# Patient Record
Sex: Female | Born: 1990 | Race: Black or African American | Hispanic: No | Marital: Single | Smoking: Never smoker
Health system: Southern US, Community
[De-identification: ages and names within clinical notes are randomized; demographics above are authoritative.]

## PROBLEM LIST (undated history)

## (undated) ENCOUNTER — Inpatient Hospital Stay (HOSPITAL_COMMUNITY): Payer: Self-pay

## (undated) ENCOUNTER — Ambulatory Visit: Source: Home / Self Care

## (undated) ENCOUNTER — Emergency Department (HOSPITAL_COMMUNITY): Admission: EM | Payer: Self-pay | Source: Home / Self Care

## (undated) DIAGNOSIS — F419 Anxiety disorder, unspecified: Secondary | ICD-10-CM

## (undated) DIAGNOSIS — F32A Depression, unspecified: Secondary | ICD-10-CM

## (undated) DIAGNOSIS — F329 Major depressive disorder, single episode, unspecified: Secondary | ICD-10-CM

## (undated) DIAGNOSIS — J45909 Unspecified asthma, uncomplicated: Secondary | ICD-10-CM

## (undated) DIAGNOSIS — I10 Essential (primary) hypertension: Secondary | ICD-10-CM

## (undated) DIAGNOSIS — O211 Hyperemesis gravidarum with metabolic disturbance: Secondary | ICD-10-CM

## (undated) DIAGNOSIS — O365990 Maternal care for other known or suspected poor fetal growth, unspecified trimester, not applicable or unspecified: Secondary | ICD-10-CM

## (undated) DIAGNOSIS — D649 Anemia, unspecified: Secondary | ICD-10-CM

## (undated) HISTORY — PX: DILATION AND CURETTAGE OF UTERUS: SHX78

## (undated) HISTORY — DX: Essential (primary) hypertension: I10

## (undated) HISTORY — DX: Anemia, unspecified: D64.9

---
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
## 2014-08-18 HISTORY — PX: LAPAROSCOPIC GASTRIC SLEEVE RESECTION: SHX5895

## 2017-11-02 ENCOUNTER — Other Ambulatory Visit: Payer: Self-pay

## 2017-11-02 ENCOUNTER — Encounter (HOSPITAL_COMMUNITY): Payer: Self-pay | Admitting: Emergency Medicine

## 2017-11-02 ENCOUNTER — Other Ambulatory Visit (HOSPITAL_COMMUNITY): Payer: Self-pay

## 2017-11-02 ENCOUNTER — Inpatient Hospital Stay (HOSPITAL_COMMUNITY)
Admission: EM | Admit: 2017-11-02 | Discharge: 2017-11-03 | Disposition: A | Discharge status: Other Acute Inpatient Hospital | Payer: Self-pay | Attending: Obstetrics & Gynecology | Admitting: Obstetrics & Gynecology

## 2017-11-02 ENCOUNTER — Emergency Department (HOSPITAL_COMMUNITY): Payer: Self-pay

## 2017-11-02 DIAGNOSIS — O10919 Unspecified pre-existing hypertension complicating pregnancy, unspecified trimester: Secondary | ICD-10-CM | POA: Diagnosis present

## 2017-11-02 DIAGNOSIS — R102 Pelvic and perineal pain: Secondary | ICD-10-CM

## 2017-11-02 DIAGNOSIS — O210 Mild hyperemesis gravidarum: Secondary | ICD-10-CM | POA: Insufficient documentation

## 2017-11-02 DIAGNOSIS — Z3A01 Less than 8 weeks gestation of pregnancy: Secondary | ICD-10-CM | POA: Insufficient documentation

## 2017-11-02 DIAGNOSIS — R1084 Generalized abdominal pain: Secondary | ICD-10-CM | POA: Insufficient documentation

## 2017-11-02 DIAGNOSIS — O26899 Other specified pregnancy related conditions, unspecified trimester: Secondary | ICD-10-CM

## 2017-11-02 DIAGNOSIS — O219 Vomiting of pregnancy, unspecified: Secondary | ICD-10-CM | POA: Diagnosis present

## 2017-11-02 LAB — CBC WITH DIFFERENTIAL/PLATELET
BASOS ABS: 0 10*3/uL (ref 0.0–0.1)
Basophils Relative: 0 %
EOS ABS: 0 10*3/uL (ref 0.0–0.7)
EOS PCT: 0 %
HCT: 35.2 % — ABNORMAL LOW (ref 36.0–46.0)
Hemoglobin: 12.1 g/dL (ref 12.0–15.0)
LYMPHS ABS: 1.5 10*3/uL (ref 0.7–4.0)
Lymphocytes Relative: 13 %
MCH: 30.9 pg (ref 26.0–34.0)
MCHC: 34.4 g/dL (ref 30.0–36.0)
MCV: 89.8 fL (ref 78.0–100.0)
Monocytes Absolute: 0.5 10*3/uL (ref 0.1–1.0)
Monocytes Relative: 5 %
Neutro Abs: 9.2 10*3/uL — ABNORMAL HIGH (ref 1.7–7.7)
Neutrophils Relative %: 82 %
PLATELETS: 253 10*3/uL (ref 150–400)
RBC: 3.92 MIL/uL (ref 3.87–5.11)
RDW: 12.8 % (ref 11.5–15.5)
WBC: 11.2 10*3/uL — ABNORMAL HIGH (ref 4.0–10.5)

## 2017-11-02 LAB — COMPREHENSIVE METABOLIC PANEL
ALT: 18 U/L (ref 14–54)
AST: 21 U/L (ref 15–41)
Albumin: 3.9 g/dL (ref 3.5–5.0)
Alkaline Phosphatase: 47 U/L (ref 38–126)
Anion gap: 12 (ref 5–15)
BUN: 9 mg/dL (ref 6–20)
CHLORIDE: 106 mmol/L (ref 101–111)
CO2: 19 mmol/L — AB (ref 22–32)
CREATININE: 0.61 mg/dL (ref 0.44–1.00)
Calcium: 9.1 mg/dL (ref 8.9–10.3)
GFR calc Af Amer: >60 mL/min (ref >60)
GFR calc non Af Amer: >60 mL/min (ref >60)
Glucose, Bld: 134 mg/dL — ABNORMAL HIGH (ref 65–99)
POTASSIUM: 3.5 mmol/L (ref 3.5–5.1)
SODIUM: 137 mmol/L (ref 135–145)
Total Bilirubin: 0.7 mg/dL (ref 0.3–1.2)
Total Protein: 7.9 g/dL (ref 6.5–8.1)

## 2017-11-02 LAB — URINALYSIS, ROUTINE W REFLEX MICROSCOPIC
BILIRUBIN URINE: NEGATIVE
Bacteria, UA: NONE SEEN
GLUCOSE, UA: NEGATIVE mg/dL
HGB URINE DIPSTICK: NEGATIVE
KETONES UR: 80 mg/dL — AB
LEUKOCYTES UA: NEGATIVE
NITRITE: NEGATIVE
Specific Gravity, Urine: 1.028 (ref 1.005–1.030)
pH: 7 (ref 5.0–8.0)

## 2017-11-02 LAB — LIPASE, BLOOD: Lipase: 33 U/L (ref 11–51)

## 2017-11-02 MED ORDER — ONDANSETRON HCL 4 MG/2ML IJ SOLN
4.0000 mg | Freq: Once | INTRAMUSCULAR | Status: AC
  Administered 2017-11-02: 4 mg via INTRAVENOUS
  Filled 2017-11-02: qty 2

## 2017-11-02 MED ORDER — DEXTROSE IN LACTATED RINGERS 5 % IV SOLN
Freq: Once | INTRAVENOUS | Status: AC
  Administered 2017-11-02: 1000 mL/h via INTRAVENOUS
  Filled 2017-11-02: qty 1000

## 2017-11-02 MED ORDER — ACETAMINOPHEN 650 MG RE SUPP
650.0000 mg | Freq: Once | RECTAL | Status: AC
  Administered 2017-11-02: 650 mg via RECTAL
  Filled 2017-11-02: qty 1

## 2017-11-02 MED ORDER — PROMETHAZINE HCL 25 MG/ML IJ SOLN
12.5000 mg | Freq: Once | INTRAMUSCULAR | Status: AC
  Administered 2017-11-02: 12.5 mg via INTRAVENOUS
  Filled 2017-11-02: qty 1

## 2017-11-02 MED ORDER — PYRIDOXINE HCL 100 MG/ML IJ SOLN
100.0000 mg | Freq: Once | INTRAMUSCULAR | Status: AC
  Administered 2017-11-02: 100 mg via INTRAVENOUS
  Filled 2017-11-02: qty 1

## 2017-11-02 MED ORDER — FENTANYL CITRATE (PF) 100 MCG/2ML IJ SOLN
50.0000 ug | Freq: Once | INTRAMUSCULAR | Status: AC
  Administered 2017-11-02: 50 ug via INTRAVENOUS
  Filled 2017-11-02: qty 2

## 2017-11-02 MED ORDER — DEXAMETHASONE SODIUM PHOSPHATE 10 MG/ML IJ SOLN
10.0000 mg | Freq: Once | INTRAMUSCULAR | Status: AC
  Administered 2017-11-02: 10 mg via INTRAVENOUS
  Filled 2017-11-02: qty 1

## 2017-11-02 MED ORDER — DEXTROSE IN LACTATED RINGERS 5 % IV SOLN
Freq: Once | INTRAVENOUS | Status: AC
  Administered 2017-11-02: 13:00:00 via INTRAVENOUS
  Filled 2017-11-02: qty 1000

## 2017-11-02 MED ORDER — LACTATED RINGERS IV BOLUS: 1000.0000 mL | Freq: Once | INTRAVENOUS | Status: DC

## 2017-11-02 MED ORDER — PROMETHAZINE HCL 25 MG/ML IJ SOLN
25.0000 mg | Freq: Once | INTRAMUSCULAR | Status: DC
  Filled 2017-11-02: qty 1

## 2017-11-02 MED ORDER — DEXTROSE IN LACTATED RINGERS 5 % IV SOLN
Freq: Once | INTRAVENOUS | Status: AC
  Administered 2017-11-02: 18:00:00 via INTRAVENOUS
  Filled 2017-11-02: qty 1000

## 2017-11-02 MED ORDER — FAMOTIDINE IN NACL 20-0.9 MG/50ML-% IV SOLN
20.0000 mg | Freq: Once | INTRAVENOUS | Status: AC
  Administered 2017-11-02: 20 mg via INTRAVENOUS
  Filled 2017-11-02: qty 50

## 2017-11-02 NOTE — ED Triage Notes (Signed)
Patient c/o not feeling well x 2 weeks. Emesis and shortness of breathe. RN was called into lobby bathroom stating a patient had collapsed. Pt was laying beside the toilet trying to vomit. Pt crying stating she cannot breathe. 02 sat 100% on RA, placed on 2L  for comfort. Pt crying in triage c/o lower abdominal pain x 2 days. Pt saw clinic yesterday and was told she is [redacted] weeks pregnant. US was reassuring.

## 2017-11-02 NOTE — MAU Note (Signed)
Upon taking IV pain medication and fluids to pain room, pt was sitting in a wheelchair with significant other about to wheel the patient out with IV still in place. Pt states that she is frustrated and wants to come back tomorrow because she "doesn't understand why everybody has to keep asking the same questions and why she is not being helped because she feels worse [facility]" RN explained to patient that questions have to be asked to verify the information is correct. RN stated to patient that CNM had ordered more IVF's and a different medication to help with nausea. Pt informed of rights to refuse if she did not wish to have the medication and IVF's and that she would have to sign an AMA form. After discussion with significant other, patient agreed to have the medication and IVF's. Pt appears to be more calm at this time.

## 2017-11-02 NOTE — MAU Note (Signed)
[facility] that started 2 days ago. Pt states that the pain "hurts all over" and feels like she is having contractions. Pt states the pain comes and goes. States she was given "something at the other hospital for pain." Pt denies vaginal bleeding or discharge. LMP 09/14/2017.

## 2017-11-02 NOTE — ED Notes (Signed)
[facility] arrived  

## 2017-11-02 NOTE — ED Provider Notes (Signed)
[facility]-EMERGENCY DEPT Provider Note   CSN: [id number] Arrival date & time: 11/02/17  1146     History   Chief Complaint Chief Complaint  Patient presents with  . Abdominal Pain  . Shortness of Breath    HPI Tammy Andrews is a 27 y.o. female who presents the emergency department with vomiting.  Patient is approximately [redacted] weeks pregnant.  She has been pregnant once before but unfortunately had a miscarriage.  She had multiple episodes of hyperemesis gravidarum during her previous pregnancy.  The patient has been persistently nauseous.  She has had URI symptoms over the past 2 weeks without fevers or chills.  Yesterday she had a few episodes of vomiting but this morning woke up with severe intractable nausea and vomiting.  She has a little bit of abdominal cramping but denies any blood or fluid leaking from her vagina.  She denies diarrhea, fevers or chills.  Patient does complain of chest congestion.  She did not take anything for her vomiting this morning.  She does not have episodes of severe vomiting outside of her pregnancies.  HPI  History reviewed. No pertinent past medical history.  There are no active problems to display for this patient.   The histories are not reviewed yet. Please review them in the "History" navigator section and refresh this SmartLink.   OB History    Gravida  1   Para      Term      Preterm      AB      Living        SAB      TAB      Ectopic      Multiple      Live Births               Home Medications    Prior to Admission medications   Not on File    Family History No family history on file.  Social History Social History   Tobacco Use  . Smoking status: Never Smoker  . Smokeless tobacco: Never Used  Substance Use Topics  . Alcohol use: Not on file  . Drug use: Not on file     Allergies   Patient has no known allergies.   Review of Systems Review of Systems  Ten systems  reviewed and are negative for acute change, except as noted in the HPI.  Physical Exam Updated Vital Signs BP (!) 149/106 (BP Location: Left Arm)   Pulse 91   Temp 98.4 F (36.9 C) (Oral)   Resp (!) 21   Ht 5\' 7"  (1.702 m)   Wt 104.8 kg (231 lb)   SpO2 100%   BMI 36.18 kg/m   Physical Exam  Constitutional: She is oriented to person, place, and time. She appears well-developed and well-nourished. No distress.  Patient leaning over the side of the bed with an emesis bag.  She is retching constantly and barely able to get words out.  She has retching and vomiting during HPI and physical exam.  HENT:  Head: Normocephalic and atraumatic.  Eyes: Conjunctivae are normal. No scleral icterus.  Neck: Normal range of motion.  Cardiovascular: Normal rate, regular rhythm and normal heart sounds. Exam reveals no gallop and no friction rub.  No murmur heard. Pulmonary/Chest: Effort normal and breath sounds normal. No respiratory distress.  Abdominal: Soft. Bowel sounds are normal. She exhibits no distension and no mass. There is no tenderness. There is no guarding.  Neurological: She is alert and oriented to person, place, and time.  Skin: Skin is warm and dry. She is not diaphoretic.  Psychiatric: Her behavior is normal.  Nursing note and vitals reviewed.    ED Treatments / Results  Labs (all labs ordered are listed, but only abnormal results are displayed) Labs Reviewed - No data to display  EKG None  Radiology No results found.  Procedures Procedures (including critical care time)  Medications Ordered in ED Medications - No data to display   Initial Impression / Assessment and Plan / ED Course  I have reviewed the triage vital signs and the nursing notes.  Pertinent labs & imaging results that were available during my care of the patient were reviewed by me and considered in my medical decision making (see chart for details).  Clinical Course as of Nov 03 1618  Thu Nov 02, 2017  1422 Anion gap: 12 [AH]  1422 Glucose(!): 134 [AH]  1422 CO2(!): 19 [AH]  1422 Ketones, ur(!): 80 [AH]  1518 Patient still extremely nauseated. She is vomiting less frequently but has intermittent gagging and retching    [AH]    Clinical Course User Index [AH] Arthor CaptainHarris, Abdulahad Mederos, PA-C   She is complaining of diffuse abdominal pain.  On reexamination she has a soft, diffusely tender abdomen without any focal tenderness, no epigastric tenderness.  I suspect abdominal wall tenderness from persistent vomiting.  Patient is clearly upset.  Her mother is at bedside and states "she cannot go home like this."  I reassured the patient and her mother that her lab work is reassuring and that this appears to be likely hyperemesis gravidarum.  She is being treated appropriately and we will send her home only if it is safe for her to go home and she can tolerate fluids.  I discussed the fact that she may not be comfortable at discharge and that she may have persistent vomiting given the fact that she is in her early pregnancy but that is not necessarily a reason to be hospitalized.  I also discussed the fact that if she is unable to tolerate holding down fluids we would not send her home.  She is continuing to get IV fluids.  I discussed the case with PA Shrosbree who will assume care of the patient for appropriate discharge.  Final Clinical Impressions(s) / ED Diagnoses   Final diagnoses:  None    ED Discharge Orders    None       Arthor CaptainHarris, Jazier Mcglamery, PA-C 11/02/17 1624    Gerhard MunchLockwood, Robert, MD 11/03/17 [phone]

## 2017-11-02 NOTE — ED Notes (Signed)
[facility] leaving with patient. Husband given address of [facility] in case he is unable to follow behind the ambulance.

## 2017-11-02 NOTE — ED Notes (Signed)
Pt is alert and oriented x 4 and is verbally responsive. Pt is accompanied with aunt pt is observed vomiting and dry heaving. Pt reports that she has generalized abdominal pain pt has n/v denies diarrhea. Pt denies pain and burning with urination. Pt report last BM 4/18 and denies evident blood in stool.

## 2017-11-02 NOTE — ED Notes (Signed)
Paperwork at nurses station 

## 2017-11-02 NOTE — ED Notes (Signed)
Report to Charge RN on OB special care floor-[facility] notified of need for transport-[facility]

## 2017-11-02 NOTE — MAU Provider Note (Signed)
Chief Complaint: Abdominal Pain and Shortness of Breath   First Provider Initiated Contact with Patient 11/02/17 2133        SUBJECTIVE HPI: Tammy Andrews is a 27 y.o. G2P0100 at [redacted]w[redacted]d by LMP who presents to maternity admissions reporting severe nausea and vomiting.  Was sent here for further management. Inadvertently taken to 3rd floor then back here.  Upset over confusion. .Family wants her to be admitted.  She denies vaginal bleeding, vaginal itching/burning, urinary symptoms, h/a, dizziness, or fever/chills.     Abdominal Pain  This is a new problem. The current episode started today. The onset quality is gradual. The problem occurs intermittently. The problem has been unchanged. The pain is located in the generalized abdominal region. The quality of the pain is colicky and cramping. The abdominal pain does not radiate. Associated symptoms include nausea. Pertinent negatives include no constipation, dysuria, fever or myalgias. Nothing aggravates the pain. The pain is relieved by nothing.   RN Note: Pt transferred from [facility] with stomach pain that started 2 days ago. Pt states that the pain "hurts all over" and feels like she is having contractions. Pt states the pain comes and goes. States she was given "something at the other hospital for pain." Pt denies vaginal bleeding or discharge. LMP 09/14/2017.  ED MD note: On recheck she cannot tolerate PO fluids, actively vomiting. States she has generalized abdominal pain with vomiting. Abdominal exam soft and mildly tender in the suprapubic area, no guarding or rigidity. She denies vaginal bleeding or discharge. Reports she had a "normal" ultrasound yesterday which showed IUP. Suspect her tenderness is related to her ongoing vomiting. Discussed this patient with OB/GYN Dr. Macon Large who recommends more LR and Zofran.    Past Medical History:  Diagnosis Date  . Diabetes mellitus without complication (HCC)    not now   Past Surgical History:   Procedure Laterality Date  . LAPAROSCOPIC GASTRIC BAND REMOVAL WITH LAPAROSCOPIC GASTRIC SLEEVE RESECTION  08/2014   Social History   Socioeconomic History  . Marital status: Single    Spouse name: Not on file  . Number of children: Not on file  . Years of education: Not on file  . Highest education level: Not on file  Occupational History  . Not on file  Social Needs  . Financial resource strain: Not on file  . Food insecurity:    Worry: Not on file    Inability: Not on file  . Transportation needs:    Medical: Not on file    Non-medical: Not on file  Tobacco Use  . Smoking status: Never Smoker  . Smokeless tobacco: Never Used  Substance and Sexual Activity  . Alcohol use: Yes    Comment: socially  . Drug use: Not Currently    Types: Marijuana  . Sexual activity: Yes    Birth control/protection: None  Lifestyle  . Physical activity:    Days per week: Not on file    Minutes per session: Not on file  . Stress: Not on file  Relationships  . Social connections:    Talks on phone: Not on file    Gets together: Not on file    Attends religious service: Not on file    Active member of club or organization: Not on file    Attends meetings of clubs or organizations: Not on file    Relationship status: Not on file  . Intimate partner violence:    Fear of current or ex partner: Not on  file    Emotionally abused: Not on file    Physically abused: Not on file    Forced sexual activity: Not on file  Other Topics Concern  . Not on file  Social History Narrative  . Not on file   No current facility-administered medications on file prior to encounter.    No current outpatient medications on file prior to encounter.   No Known Allergies  I have reviewed patient's Past Medical Hx, Surgical Hx, Family Hx, Social Hx, medications and allergies.   ROS:  Review of Systems  Constitutional: Positive for fatigue. Negative for chills and fever.  Gastrointestinal: Positive for  nausea. Negative for constipation.  Genitourinary: Negative for dysuria, pelvic pain and vaginal bleeding.  Musculoskeletal: Negative for myalgias.  Neurological: Negative for dizziness.   Review of Systems  Other systems negative   Physical Exam  Physical Exam Patient Vitals for the past 24 hrs:  BP Temp Temp src Pulse Resp SpO2 Height Weight  11/02/17 2058 (!) 154/92 98.8 F (37.1 C) Oral 90 20 100 % - -  11/02/17 2057 - - - - - - 5\' 7"  (1.702 m) 256 lb (116.1 kg)  11/02/17 2000 (!) 154/94 - - 93 15 99 % - -  11/02/17 1952 (!) 158/106 - - 94 15 98 % - -  11/02/17 1930 (!) 164/108 - - 95 - 98 % - -  11/02/17 1603 (!) 117/102 98.7 F (37.1 C) Oral 81 (!) 22 100 % - -  11/02/17 1530 (!) 117/102 - - - - 100 % - -  11/02/17 1517 125/83 - - - - 100 % - -  11/02/17 1516 - 99 F (37.2 C) Oral - - - - -  11/02/17 1505 - - - - - 100 % - -  11/02/17 1400 (!) 152/93 - - - 16 100 % - -  11/02/17 1229 (!) 149/106 98.4 F (36.9 C) Oral 91 16 100 % 5\' 7"  (1.702 m) 231 lb (104.8 kg)  11/02/17 1208 127/78 - - 88 (!) 24 100 % - -   Constitutional: Well-developed, well-nourished female in no acute distress. Does appear tired, weak Cardiovascular: normal rate and regular rhythm Respiratory: normal effort GI: Abd soft, non-tender. Pos BS x 4 MS: Extremities nontender, no edema, normal ROM Neurologic: Alert and oriented x 4.  GU: Neg CVAT.  PELVIC EXAM: Deferred  LAB RESULTS Results for orders placed or performed during the hospital encounter of 11/02/17 (from the past 24 hour(s))  CBC with Differential     Status: Abnormal   Collection Time: 11/02/17 12:58 PM  Result Value Ref Range   WBC 11.2 (H) 4.0 - 10.5 K/uL   RBC 3.92 3.87 - 5.11 MIL/uL   Hemoglobin 12.1 12.0 - 15.0 g/dL   HCT 40.935.2 (L) 81.136.0 - 91.446.0 %   MCV 89.8 78.0 - 100.0 fL   MCH 30.9 26.0 - 34.0 pg   MCHC 34.4 30.0 - 36.0 g/dL   RDW 78.212.8 95.611.5 - 21.315.5 %   Platelets 253 150 - 400 K/uL   Neutrophils Relative % 82 %   Neutro  Abs 9.2 (H) 1.7 - 7.7 K/uL   Lymphocytes Relative 13 %   Lymphs Abs 1.5 0.7 - 4.0 K/uL   Monocytes Relative 5 %   Monocytes Absolute 0.5 0.1 - 1.0 K/uL   Eosinophils Relative 0 %   Eosinophils Absolute 0.0 0.0 - 0.7 K/uL   Basophils Relative 0 %   Basophils Absolute 0.0 0.0 -  0.1 K/uL  Comprehensive metabolic panel     Status: Abnormal   Collection Time: 11/02/17 12:58 PM  Result Value Ref Range   Sodium 137 135 - 145 mmol/L   Potassium 3.5 3.5 - 5.1 mmol/L   Chloride 106 101 - 111 mmol/L   CO2 19 (L) 22 - 32 mmol/L   Glucose, Bld 134 (H) 65 - 99 mg/dL   BUN 9 6 - 20 mg/dL   Creatinine, Ser 1.61 0.44 - 1.00 mg/dL   Calcium 9.1 8.9 - 09.6 mg/dL   Total Protein 7.9 6.5 - 8.1 g/dL   Albumin 3.9 3.5 - 5.0 g/dL   AST 21 15 - 41 U/L   ALT 18 14 - 54 U/L   Alkaline Phosphatase 47 38 - 126 U/L   Total Bilirubin 0.7 0.3 - 1.2 mg/dL   GFR calc non Af Amer >60 >60 mL/min   GFR calc Af Amer >60 >60 mL/min   Anion gap 12 5 - 15  Lipase, blood     Status: None   Collection Time: 11/02/17 12:58 PM  Result Value Ref Range   Lipase 33 11 - 51 U/L  Urinalysis, Routine w reflex microscopic     Status: Abnormal   Collection Time: 11/02/17  1:33 PM  Result Value Ref Range   Color, Urine AMBER (A) YELLOW   APPearance HAZY (A) CLEAR   Specific Gravity, Urine 1.028 1.005 - 1.030   pH 7.0 5.0 - 8.0   Glucose, UA NEGATIVE NEGATIVE mg/dL   Hgb urine dipstick NEGATIVE NEGATIVE   Bilirubin Urine NEGATIVE NEGATIVE   Ketones, ur 80 (A) NEGATIVE mg/dL   Protein, ur >=045 (A) NEGATIVE mg/dL   Nitrite NEGATIVE NEGATIVE   Leukocytes, UA NEGATIVE NEGATIVE   RBC / HPF 6-30 0 - 5 RBC/hpf   WBC, UA 0-5 0 - 5 WBC/hpf   Bacteria, UA NONE SEEN NONE SEEN   Squamous Epithelial / LPF 0-5 (A) NONE SEEN   Mucus PRESENT        IMAGING Dg Chest 2 View  Result Date: 11/02/2017 CLINICAL DATA:  Nausea and vomiting and shortness of breath EXAM: CHEST - 2 VIEW COMPARISON:  None. FINDINGS: The heart size and  mediastinal contours are within normal limits. Both lungs are clear. The visualized skeletal structures are unremarkable. IMPRESSION: No active cardiopulmonary disease. Electronically Signed   By: Alcide Clever M.D.   On: 11/02/2017 13:23    MAU Management/MDM: Has some diffuse abdominal pain, requested med. Fentanyl given with good relief IV hydration continued Pepcid added which produced considerable relief Decadron added (one dose) for hyperemesis After these measures, she was able to keep down a whole cup of ice chips   ASSESSMENT 1. Pelvic pain affecting pregnancy   2. Pelvic pain affecting pregnancy   3.     Hyperemesis vs Nausea/vomiting of pregnancy 4.    Chronic hypertension   PLAN Discharge home Rx labetalol 200mg  bid Rx Zofran ODT for nausea Rx Protonix for GERD Rx Promethazine to alternate with Zofran Advance diet as tolerated Plans care with Dr Hinton Rao  Pt stable at time of discharge. Encouraged to return here or to other Urgent Care/ED if she develops worsening of symptoms, increase in pain, fever, or other concerning symptoms.    Wynelle Bourgeois CNM, MSN Certified Nurse-Midwife 11/02/2017  9:34 PM

## 2017-11-02 NOTE — ED Provider Notes (Signed)
Patient with hyperemesis gravidarum.  Please read H&P PA Arthor CaptainAbigail Harris note for further details.  Lab work without any major lecture light abnormalities.  CBC with mild leukocytosis (WBC 11.2), suspect this is reactive given her persistent vomiting.  Lipase negative.  UA reveals small amount of ketones, likely related to her recent vomiting and poor PO intake.  She also has proteinuria, no hypertension.   Patient has received 25 mg Phenergan, 2 L D5 and LR and Vitamin B6.   On recheck she cannot tolerate PO fluids, actively vomiting. States she has generalized abdominal pain with vomiting. Abdominal exam soft and mildly tender in the suprapubic area, no guarding or rigidity. She denies vaginal bleeding or discharge. Reports she had a "normal" ultrasound yesterday which showed IUP. Suspect her tenderness is related to her ongoing vomiting. Discussed this patient with OB/GYN Dr. Macon LargeAnyanwu who recommends more LR and Zofran.   Patient received 4mg  IV Zofran and continues to receive LR.  On recheck, she vomits with p.o. challenge.  Discussed this patient again with Dr. Macon LargeAnyanwu who recommends transfer to MAU for further management. Patient and her husband at bedside informed.  Vitals:   11/02/17 1530 11/02/17 1603  BP: (!) 117/102 (!) 117/102  Pulse:  81  Resp:  (!) 22  Temp:  98.7 F (37.1 C)  SpO2: 100% 100%        Lawrence MarseillesShrosbree, Emily J, PA-C 11/02/17 1940    Gerhard MunchLockwood, Robert, MD 11/03/17 [phone]

## 2017-11-03 DIAGNOSIS — O210 Mild hyperemesis gravidarum: Secondary | ICD-10-CM

## 2017-11-03 DIAGNOSIS — O219 Vomiting of pregnancy, unspecified: Secondary | ICD-10-CM | POA: Diagnosis present

## 2017-11-03 DIAGNOSIS — O10919 Unspecified pre-existing hypertension complicating pregnancy, unspecified trimester: Secondary | ICD-10-CM | POA: Diagnosis present

## 2017-11-03 MED ORDER — ONDANSETRON 4 MG PO TBDP: 4.0000 mg | ORAL_TABLET | Freq: Four times a day (QID) | ORAL | 0 refills | Status: DC | PRN

## 2017-11-03 MED ORDER — PANTOPRAZOLE SODIUM 20 MG PO TBEC: 20.0000 mg | DELAYED_RELEASE_TABLET | Freq: Every day | ORAL | 2 refills | Status: DC

## 2017-11-03 MED ORDER — LABETALOL HCL 200 MG PO TABS: 200.0000 mg | ORAL_TABLET | Freq: Two times a day (BID) | ORAL | 3 refills | Status: DC

## 2017-11-03 MED ORDER — PROMETHAZINE HCL 25 MG PO TABS: 25.0000 mg | ORAL_TABLET | Freq: Four times a day (QID) | ORAL | 2 refills | Status: DC | PRN

## 2017-11-03 NOTE — MAU Note (Signed)
MAU provider notified of discharge vital signs.

## 2017-11-03 NOTE — Discharge Instructions (Signed)

## 2017-11-04 ENCOUNTER — Inpatient Hospital Stay (HOSPITAL_COMMUNITY)
Admission: AD | Admit: 2017-11-04 | Discharge: 2017-11-04 | Disposition: A | Discharge status: Home / Self Care | Payer: Self-pay | Source: Ambulatory Visit | Attending: Family Medicine | Admitting: Family Medicine

## 2017-11-04 ENCOUNTER — Inpatient Hospital Stay (HOSPITAL_COMMUNITY): Payer: Self-pay

## 2017-11-04 ENCOUNTER — Encounter (HOSPITAL_COMMUNITY): Payer: Self-pay

## 2017-11-04 DIAGNOSIS — E119 Type 2 diabetes mellitus without complications: Secondary | ICD-10-CM | POA: Insufficient documentation

## 2017-11-04 DIAGNOSIS — Z3A08 8 weeks gestation of pregnancy: Secondary | ICD-10-CM | POA: Insufficient documentation

## 2017-11-04 DIAGNOSIS — Z9889 Other specified postprocedural states: Secondary | ICD-10-CM | POA: Insufficient documentation

## 2017-11-04 DIAGNOSIS — O10919 Unspecified pre-existing hypertension complicating pregnancy, unspecified trimester: Secondary | ICD-10-CM

## 2017-11-04 DIAGNOSIS — Z833 Family history of diabetes mellitus: Secondary | ICD-10-CM | POA: Insufficient documentation

## 2017-11-04 DIAGNOSIS — O3481 Maternal care for other abnormalities of pelvic organs, first trimester: Secondary | ICD-10-CM | POA: Insufficient documentation

## 2017-11-04 DIAGNOSIS — R109 Unspecified abdominal pain: Secondary | ICD-10-CM

## 2017-11-04 DIAGNOSIS — R1013 Epigastric pain: Secondary | ICD-10-CM | POA: Insufficient documentation

## 2017-11-04 DIAGNOSIS — Z8249 Family history of ischemic heart disease and other diseases of the circulatory system: Secondary | ICD-10-CM | POA: Insufficient documentation

## 2017-11-04 DIAGNOSIS — O24311 Unspecified pre-existing diabetes mellitus in pregnancy, first trimester: Secondary | ICD-10-CM | POA: Insufficient documentation

## 2017-11-04 DIAGNOSIS — O219 Vomiting of pregnancy, unspecified: Secondary | ICD-10-CM | POA: Insufficient documentation

## 2017-11-04 DIAGNOSIS — O26899 Other specified pregnancy related conditions, unspecified trimester: Secondary | ICD-10-CM

## 2017-11-04 DIAGNOSIS — N83201 Unspecified ovarian cyst, right side: Secondary | ICD-10-CM | POA: Insufficient documentation

## 2017-11-04 DIAGNOSIS — O26891 Other specified pregnancy related conditions, first trimester: Secondary | ICD-10-CM | POA: Insufficient documentation

## 2017-11-04 LAB — CBC WITH DIFFERENTIAL/PLATELET
BASOS ABS: 0 10*3/uL (ref 0.0–0.1)
Basophils Relative: 0 %
EOS PCT: 0 %
Eosinophils Absolute: 0.1 10*3/uL (ref 0.0–0.7)
HCT: 32.3 % — ABNORMAL LOW (ref 36.0–46.0)
HEMOGLOBIN: 11.2 g/dL — AB (ref 12.0–15.0)
LYMPHS PCT: 23 %
Lymphs Abs: 3.2 10*3/uL (ref 0.7–4.0)
MCH: 31.2 pg (ref 26.0–34.0)
MCHC: 34.7 g/dL (ref 30.0–36.0)
MCV: 90 fL (ref 78.0–100.0)
Monocytes Absolute: 0.4 10*3/uL (ref 0.1–1.0)
Monocytes Relative: 3 %
NEUTROS PCT: 74 %
Neutro Abs: 10.2 10*3/uL — ABNORMAL HIGH (ref 1.7–7.7)
PLATELETS: 241 10*3/uL (ref 150–400)
RBC: 3.59 MIL/uL — AB (ref 3.87–5.11)
RDW: 13.3 % (ref 11.5–15.5)
WBC: 13.9 10*3/uL — AB (ref 4.0–10.5)

## 2017-11-04 LAB — COMPREHENSIVE METABOLIC PANEL
ALT: 17 U/L (ref 14–54)
ANION GAP: 9 (ref 5–15)
AST: 23 U/L (ref 15–41)
Albumin: 3.6 g/dL (ref 3.5–5.0)
Alkaline Phosphatase: 41 U/L (ref 38–126)
BILIRUBIN TOTAL: 0.4 mg/dL (ref 0.3–1.2)
BUN: 16 mg/dL (ref 6–20)
CHLORIDE: 108 mmol/L (ref 101–111)
CO2: 20 mmol/L — ABNORMAL LOW (ref 22–32)
Calcium: 8.5 mg/dL — ABNORMAL LOW (ref 8.9–10.3)
Creatinine, Ser: 0.61 mg/dL (ref 0.44–1.00)
Glucose, Bld: 111 mg/dL — ABNORMAL HIGH (ref 65–99)
POTASSIUM: 3.6 mmol/L (ref 3.5–5.1)
Sodium: 137 mmol/L (ref 135–145)
TOTAL PROTEIN: 7.2 g/dL (ref 6.5–8.1)

## 2017-11-04 LAB — URINALYSIS, ROUTINE W REFLEX MICROSCOPIC
BILIRUBIN URINE: NEGATIVE
Bacteria, UA: NONE SEEN
GLUCOSE, UA: NEGATIVE mg/dL
HGB URINE DIPSTICK: NEGATIVE
KETONES UR: NEGATIVE mg/dL
Nitrite: NEGATIVE
PH: 5 (ref 5.0–8.0)
PROTEIN: 100 mg/dL — AB
Specific Gravity, Urine: 1.033 — ABNORMAL HIGH (ref 1.005–1.030)

## 2017-11-04 LAB — HCG, QUANTITATIVE, PREGNANCY: HCG, BETA CHAIN, QUANT, S: 97915 m[IU]/mL — AB (ref <5)

## 2017-11-04 LAB — ABO/RH: ABO/RH(D): O POS

## 2017-11-04 MED ORDER — PROMETHAZINE HCL 25 MG PO TABS: 25.0000 mg | ORAL_TABLET | Freq: Four times a day (QID) | ORAL | 2 refills | Status: DC | PRN

## 2017-11-04 MED ORDER — FAMOTIDINE IN NACL 20-0.9 MG/50ML-% IV SOLN
20.0000 mg | Freq: Once | INTRAVENOUS | Status: AC
  Administered 2017-11-04: 20 mg via INTRAVENOUS
  Filled 2017-11-04: qty 50

## 2017-11-04 MED ORDER — LABETALOL HCL 200 MG PO TABS: 200.0000 mg | ORAL_TABLET | Freq: Two times a day (BID) | ORAL | 3 refills | Status: DC

## 2017-11-04 MED ORDER — FAMOTIDINE 20 MG PO TABS: 20.0000 mg | ORAL_TABLET | Freq: Two times a day (BID) | ORAL | 2 refills | Status: DC

## 2017-11-04 MED ORDER — ONDANSETRON HCL 4 MG/2ML IJ SOLN
4.0000 mg | Freq: Once | INTRAMUSCULAR | Status: AC
  Administered 2017-11-04: 4 mg via INTRAVENOUS
  Filled 2017-11-04: qty 2

## 2017-11-04 MED ORDER — PROMETHAZINE HCL 25 MG/ML IJ SOLN
12.5000 mg | Freq: Once | INTRAMUSCULAR | Status: AC
  Administered 2017-11-04: 12.5 mg via INTRAVENOUS
  Filled 2017-11-04: qty 1

## 2017-11-04 MED ORDER — PROMETHAZINE HCL 25 MG/ML IJ SOLN
25.0000 mg | Freq: Once | INTRAVENOUS | Status: AC
  Administered 2017-11-04: 25 mg via INTRAVENOUS
  Filled 2017-11-04: qty 1

## 2017-11-04 MED ORDER — NALBUPHINE HCL 10 MG/ML IJ SOLN
10.0000 mg | Freq: Once | INTRAMUSCULAR | Status: AC
  Administered 2017-11-04: 10 mg via INTRAVENOUS
  Filled 2017-11-04: qty 1

## 2017-11-04 NOTE — MAU Note (Signed)
Pt did not pick up medication because her insurance didn't cover the cost. Having abdominal pain also. 10/10

## 2017-11-04 NOTE — MAU Provider Note (Signed)
History     CSN: [id number]  Arrival date and time: 11/04/17 0800   First Provider Initiated Contact with Patient 11/04/17 0845      Chief Complaint  Patient presents with  . Emesis  . Abdominal Pain   HPI [name] is a 27 y.o. G2P0100 at [redacted]w[redacted]d who presents to MAU today with complaint of N/V and abdominal pain. Patient seen twice in the last 2 days for same symptoms significant abdominal pain. The majority of her pain is epigastric and LLQ. No vaginal bleeding or fever. She was given Rx for Zofran, Phenergan and Protonix and states she cannot afford her medications so she has been unable to take anything at home.   OB History    Gravida  2   Para  1   Term  0   Preterm  1   AB  0   Living  0     SAB  0   TAB  0   Ectopic  0   Multiple  0   Live Births  0           Past Medical History:  Diagnosis Date  . Diabetes mellitus without complication (HCC)    not now    Past Surgical History:  Procedure Laterality Date  . LAPAROSCOPIC GASTRIC BAND REMOVAL WITH LAPAROSCOPIC GASTRIC SLEEVE RESECTION  08/2014    Family History  Problem Relation Age of Onset  . Hypertension Father   . Diabetes Paternal Grandmother     Social History   Tobacco Use  . Smoking status: Never Smoker  . Smokeless tobacco: Never Used  Substance Use Topics  . Alcohol use: Yes    Comment: socially  . Drug use: Not Currently    Types: Marijuana    Allergies: No Known Allergies  No medications prior to admission.    Review of Systems  Constitutional: Negative for fever.  Gastrointestinal: Positive for abdominal pain, nausea and vomiting. Negative for constipation and diarrhea.  Genitourinary: Negative for vaginal bleeding and vaginal discharge.   Physical Exam   Blood pressure 138/79, pulse 76, temperature 98.5 F (36.9 C), temperature source Oral, resp. rate 18, weight 258 lb (117 kg), last menstrual period 09/04/2017, SpO2 100 %.  Physical Exam  Nursing  note and vitals reviewed. Constitutional: She is oriented to person, place, and time. She appears well-developed and well-nourished. No distress.  Patient is crying and obviously uncomfortable  HENT:  Head: Normocephalic and atraumatic.  Cardiovascular: Normal rate.  Respiratory: Effort normal.  GI: Soft. She exhibits no distension and no mass. There is tenderness (very mild epigastric and LLQ tenderness to palpation ). There is no rebound and no guarding.  Neurological: She is alert and oriented to person, place, and time.  Skin: Skin is warm and dry. No erythema.  Psychiatric: She has a normal mood and affect.    Results for orders placed or performed during the hospital encounter of 11/04/17 (from the past 24 hour(s))  Urinalysis, Routine w reflex microscopic     Status: Abnormal   Collection Time: 11/04/17  8:20 AM  Result Value Ref Range   Color, Urine YELLOW YELLOW   APPearance HAZY (A) CLEAR   Specific Gravity, Urine 1.033 (H) 1.005 - 1.030   pH 5.0 5.0 - 8.0   Glucose, UA NEGATIVE NEGATIVE mg/dL   Hgb urine dipstick NEGATIVE NEGATIVE   Bilirubin Urine NEGATIVE NEGATIVE   Ketones, ur NEGATIVE NEGATIVE mg/dL   Protein, ur 409 (A) NEGATIVE mg/dL  Nitrite NEGATIVE NEGATIVE   Leukocytes, UA TRACE (A) NEGATIVE   RBC / HPF 6-30 0 - 5 RBC/hpf   WBC, UA 0-5 0 - 5 WBC/hpf   Bacteria, UA NONE SEEN NONE SEEN   Squamous Epithelial / LPF 6-30 (A) NONE SEEN   Mucus PRESENT   Comprehensive metabolic panel     Status: Abnormal   Collection Time: 11/04/17  9:25 AM  Result Value Ref Range   Sodium 137 135 - 145 mmol/L   Potassium 3.6 3.5 - 5.1 mmol/L   Chloride 108 101 - 111 mmol/L   CO2 20 (L) 22 - 32 mmol/L   Glucose, Bld 111 (H) 65 - 99 mg/dL   BUN 16 6 - 20 mg/dL   Creatinine, Ser 1.61 0.44 - 1.00 mg/dL   Calcium 8.5 (L) 8.9 - 10.3 mg/dL   Total Protein 7.2 6.5 - 8.1 g/dL   Albumin 3.6 3.5 - 5.0 g/dL   AST 23 15 - 41 U/L   ALT 17 14 - 54 U/L   Alkaline Phosphatase 41 38 -  126 U/L   Total Bilirubin 0.4 0.3 - 1.2 mg/dL   GFR calc non Af Amer >60 >60 mL/min   GFR calc Af Amer >60 >60 mL/min   Anion gap 9 5 - 15  CBC with Differential/Platelet     Status: Abnormal   Collection Time: 11/04/17  9:25 AM  Result Value Ref Range   WBC 13.9 (H) 4.0 - 10.5 K/uL   RBC 3.59 (L) 3.87 - 5.11 MIL/uL   Hemoglobin 11.2 (L) 12.0 - 15.0 g/dL   HCT 09.6 (L) 04.5 - 40.9 %   MCV 90.0 78.0 - 100.0 fL   MCH 31.2 26.0 - 34.0 pg   MCHC 34.7 30.0 - 36.0 g/dL   RDW 81.1 91.4 - 78.2 %   Platelets 241 150 - 400 K/uL   Neutrophils Relative % 74 %   Neutro Abs 10.2 (H) 1.7 - 7.7 K/uL   Lymphocytes Relative 23 %   Lymphs Abs 3.2 0.7 - 4.0 K/uL   Monocytes Relative 3 %   Monocytes Absolute 0.4 0.1 - 1.0 K/uL   Eosinophils Relative 0 %   Eosinophils Absolute 0.1 0.0 - 0.7 K/uL   Basophils Relative 0 %   Basophils Absolute 0.0 0.0 - 0.1 K/uL  ABO/Rh     Status: None   Collection Time: 11/04/17  9:25 AM  Result Value Ref Range   ABO/RH(D)      O POS Performed at [facility], [address], [place], [place] [address]   hCG, quantitative, pregnancy     Status: Abnormal   Collection Time: 11/04/17  9:25 AM  Result Value Ref Range   hCG, Beta Chain, Quant, S 97,915 (H) <5 mIU/mL   Dg Chest 2 View  Result Date: 11/02/2017 CLINICAL DATA:  Nausea and vomiting and shortness of breath EXAM: CHEST - 2 VIEW COMPARISON:  None. FINDINGS: The heart size and mediastinal contours are within normal limits. Both lungs are clear. The visualized skeletal structures are unremarkable. IMPRESSION: No active cardiopulmonary disease. Electronically Signed   By: Alcide Clever M.D.   On: 11/02/2017 13:23   US Ob Comp Less 14 Wks  Result Date: 11/04/2017 CLINICAL DATA:  Nausea, vomiting and cramping. EXAM: OBSTETRIC <14 WK ULTRASOUND TECHNIQUE: Transabdominal ultrasound was performed for evaluation of the gestation as well as the maternal uterus and adnexal regions. COMPARISON:  None. FINDINGS:  Intrauterine gestational sac: Single. Yolk sac:  Visualized.  Embryo:  Visualized. Cardiac Activity: Visualized. Heart Rate: 168 bpm CRL:   17.7 mm   8 w 1 d                  US EDC: 06/15/2018 Subchorionic hemorrhage:  None visualized. Maternal uterus/adnexae: A 6.9 x 5.2 x 5.2 cm anechoic lesion with increased through transmission is seen in the right ovary. Documented peripheral vascularity may be within the ovary itself. Left ovary is visualized. No free fluid. IMPRESSION: 1. Single living intrauterine pregnancy with gestational age of [redacted] weeks 1 day and estimated date of confinement of 06/15/2018. No complicating features. 2. Large right ovarian cyst. Based on size, annual follow-up is recommended. This recommendation follows the consensus statement: Management of Asymptomatic Ovarian and Other Adnexal Cysts Imaged at US: Society of Radiologists in Ultrasound Consensus Conference Statement. Radiology 2010; [phone]. Electronically Signed   By: Leanna BattlesMelinda  Blietz M.D.   On: 11/04/2017 11:50    MAU Course  Procedures None  MDM +UPT UA, wet prep, GC/chlamydia, CBC, ABO/Rh, quant hCG, HIV, RPR and US today to rule out ectopic pregnancy CMP also for N/V IV Phenergan with 20 mg Pepcid IV given in MAU  UA shows only mild dehydration at this time.  Patient continues to dry heave, but usually only when others enter the room. Additional 12.5 mg IV Phenergan, 10 mg Nubaine and 4 mg Zofran given. Patient is resting comfortably prior to discharge. Rx explained in detail with significant other.  Assessment and Plan  A: SIUP at 1245w2d Ovarian cyst  Chronic hypertension in pregnancy, first trimester  Abdominal pain in pregnancy, first trimester Nausea and vomiting in pregnancy prior to [redacted] weeks gestation   P: Discharge home Rx for Phenergan, Pepcid and Labetalol First trimester precautions discussed Diet for N/V in pregnancy discussed and included in AVS Patient advised to follow-up with OB provider of  choice to start prenatal care Patient may return to MAU as needed or if her condition were to change or worsen   Vonzella NippleJulie Sundi Slevin, PA-C 11/04/2017, 4:12 PM

## 2017-11-04 NOTE — Discharge Instructions (Signed)
Eating Plan for Nausea and Vomiting in Pregnancy °Hyperemesis gravidarum is a severe form of morning sickness. Because this condition causes severe nausea and vomiting, it can lead to dehydration, malnutrition, and weight loss. One way to lessen the symptoms of nausea and vomiting is to follow the eating plan for hyperemesis gravidarum. It is often used along with prescribed medicines to control your symptoms. °What can I do to relieve my symptoms? °Listen to your body. Everyone is different and has different preferences. Find what works best for you. Take any of the following actions that are helpful to you: °· Eat and drink slowly. °· Eat 5-6 small meals daily instead of 3 large meals. °· Eat crackers before you get out of bed in the morning. °· Try having a snack in the middle of the night. °· Starchy foods are usually tolerated well. Examples include cereal, toast, bread, potatoes, pasta, rice, and pretzels. °· Ginger may help with nausea. Add ¼ tsp ground ginger to hot tea or choose ginger tea. °· Try drinking 100% fruit juice or an electrolyte drink. An electrolyte drink contains sodium, potassium, and chloride. °· Continue to take your prenatal vitamins as told by your health care provider. If you are having trouble taking your prenatal vitamins, talk with your health care provider about different options. °· Include at least 1 serving of protein with your meals and snacks. Protein options include meats or poultry, beans, nuts, eggs, and yogurt. Try eating a protein-rich snack before bed. Examples of these snacks include cheese and crackers or half of a peanut butter or turkey sandwich. °· Consider eliminating foods that trigger your symptoms. These may include spicy foods, coffee, high-fat foods, very sweet foods, and acidic foods. °· Try meals that have more protein combined with bland, salty, lower-fat, and dry foods, such as nuts, seeds, pretzels, crackers, and cereal. °· Talk with your healthcare  provider about starting a supplement of vitamin B6. °· Have fluids that are cold, clear, and carbonated or sour. Examples include lemonade, ginger ale, lemon-lime soda, ice water, and sparkling water. °· Try lemon or mint tea. °· Try brushing your teeth or using a mouth rinse after meals. ° °What should I avoid to reduce my symptoms? °Avoiding some of the following things may help reduce your symptoms. °· Foods with strong smells. Try eating meals in well-ventilated areas that are free of odors. °· Drinking water or other beverages with meals. Try not to drink anything during the 30 minutes before and after your meals. °· Drinking more than 1 cup of fluid at a time. Sometimes using a straw helps. °· Fried or high-fat foods, such as butter and cream sauces. °· Spicy foods. °· Skipping meals as best as you can. Nausea can be more intense on an empty stomach. If you cannot tolerate food at that time, do not force it. Try sucking on ice chips or other frozen items, and make up for missed calories later. °· Lying down within 2 hours after eating. °· Environmental triggers. These may include smoky rooms, closed spaces, rooms with strong smells, warm or humid places, overly loud and noisy rooms, and rooms with motion or flickering lights. °· Quick and sudden changes in your movement. ° °This information is not intended to replace advice given to you by your health care provider. Make sure you discuss any questions you have with your health care provider. °Document Released: 05/01/2007 Document Revised: 03/02/2016 Document Reviewed: 02/02/2016 °Elsevier Interactive Patient Education © 2018 Elsevier Inc. ° °

## 2017-11-05 LAB — HIV ANTIBODY (ROUTINE TESTING W REFLEX): HIV SCREEN 4TH GENERATION: NONREACTIVE

## 2017-11-05 LAB — RPR: RPR: NONREACTIVE

## 2017-11-07 ENCOUNTER — Encounter (HOSPITAL_COMMUNITY): Payer: Self-pay

## 2017-11-07 ENCOUNTER — Inpatient Hospital Stay (HOSPITAL_COMMUNITY)
Admission: AD | Admit: 2017-11-07 | Discharge: 2017-11-07 | Disposition: A | Discharge status: Home / Self Care | Payer: Medicaid Other | Source: Ambulatory Visit | Attending: Family Medicine | Admitting: Family Medicine

## 2017-11-07 ENCOUNTER — Other Ambulatory Visit: Payer: Self-pay

## 2017-11-07 DIAGNOSIS — Z3A09 9 weeks gestation of pregnancy: Secondary | ICD-10-CM | POA: Insufficient documentation

## 2017-11-07 DIAGNOSIS — O10911 Unspecified pre-existing hypertension complicating pregnancy, first trimester: Secondary | ICD-10-CM | POA: Insufficient documentation

## 2017-11-07 DIAGNOSIS — R109 Unspecified abdominal pain: Secondary | ICD-10-CM

## 2017-11-07 DIAGNOSIS — O218 Other vomiting complicating pregnancy: Secondary | ICD-10-CM | POA: Insufficient documentation

## 2017-11-07 DIAGNOSIS — Z9884 Bariatric surgery status: Secondary | ICD-10-CM | POA: Insufficient documentation

## 2017-11-07 DIAGNOSIS — O26899 Other specified pregnancy related conditions, unspecified trimester: Secondary | ICD-10-CM

## 2017-11-07 DIAGNOSIS — O219 Vomiting of pregnancy, unspecified: Secondary | ICD-10-CM

## 2017-11-07 LAB — URINALYSIS, ROUTINE W REFLEX MICROSCOPIC
Bacteria, UA: NONE SEEN
Bilirubin Urine: NEGATIVE
Glucose, UA: NEGATIVE mg/dL
Hgb urine dipstick: NEGATIVE
Ketones, ur: 5 mg/dL — AB
Leukocytes, UA: NEGATIVE
Nitrite: NEGATIVE
PROTEIN: 100 mg/dL — AB
SPECIFIC GRAVITY, URINE: 1.02 (ref 1.005–1.030)
pH: 9 — ABNORMAL HIGH (ref 5.0–8.0)

## 2017-11-07 MED ORDER — LACTATED RINGERS IV BOLUS
1000.0000 mL | Freq: Once | INTRAVENOUS | Status: AC
  Administered 2017-11-07: 1000 mL via INTRAVENOUS

## 2017-11-07 MED ORDER — NALBUPHINE HCL 10 MG/ML IJ SOLN
5.0000 mg | Freq: Once | INTRAMUSCULAR | Status: AC
  Administered 2017-11-07: 5 mg via INTRAVENOUS
  Filled 2017-11-07: qty 1

## 2017-11-07 MED ORDER — PROMETHAZINE HCL 25 MG/ML IJ SOLN
25.0000 mg | Freq: Once | INTRAVENOUS | Status: AC
  Administered 2017-11-07: 25 mg via INTRAVENOUS
  Filled 2017-11-07: qty 1

## 2017-11-07 MED ORDER — PROMETHAZINE HCL 25 MG RE SUPP: 25.0000 mg | Freq: Four times a day (QID) | RECTAL | 0 refills | Status: DC | PRN

## 2017-11-07 MED ORDER — LABETALOL HCL 5 MG/ML IV SOLN
20.0000 mg | Freq: Once | INTRAVENOUS | Status: AC
  Administered 2017-11-07: 20 mg via INTRAVENOUS
  Filled 2017-11-07: qty 4

## 2017-11-07 MED ORDER — FAMOTIDINE IN NACL 20-0.9 MG/50ML-% IV SOLN
20.0000 mg | Freq: Once | INTRAVENOUS | Status: AC
  Administered 2017-11-07: 20 mg via INTRAVENOUS
  Filled 2017-11-07: qty 50

## 2017-11-07 NOTE — MAU Note (Signed)
N/v, chest pain, back pain for 1 week

## 2017-11-07 NOTE — MAU Provider Note (Signed)
History     CSN: [id number]  Arrival date and time: 11/07/17 1352   First Provider Initiated Contact with Patient 11/07/17 1425      Chief Complaint  Patient presents with  . Abdominal Pain  . Back Pain  . Shortness of Breath   HPI  [name] is a 27 y.o. G2P0100 at [redacted]w[redacted]d who presents to MAU today with complaint of abdominal pain, chest pain and N/V. The patient has been seen numerous times for N/V in this pregnancy already. She had previously not been able to afford her medications, but now states she has been taking Phenergan and Pepcid but is not often tolerating her PO medications. She has not filled her Labetalol.    OB History    Gravida  2   Para  1   Term  0   Preterm  1   AB  0   Living  0     SAB  0   TAB  0   Ectopic  0   Multiple  0   Live Births  0           Past Medical History:  Diagnosis Date  . Diabetes mellitus without complication (HCC)    not now    Past Surgical History:  Procedure Laterality Date  . LAPAROSCOPIC GASTRIC BAND REMOVAL WITH LAPAROSCOPIC GASTRIC SLEEVE RESECTION  08/2014    Family History  Problem Relation Age of Onset  . Hypertension Father   . Diabetes Paternal Grandmother     Social History   Tobacco Use  . Smoking status: Never Smoker  . Smokeless tobacco: Never Used  Substance Use Topics  . Alcohol use: Yes    Comment: socially  . Drug use: Not Currently    Types: Marijuana    Allergies: No Known Allergies  No medications prior to admission.    Review of Systems  Constitutional: Negative for fever.  Gastrointestinal: Positive for abdominal pain, nausea and vomiting. Negative for constipation and diarrhea.  Genitourinary: Negative for dysuria, frequency, urgency, vaginal bleeding and vaginal discharge.   Physical Exam   Blood pressure (!) 145/75, pulse 93, temperature 99.1 F (37.3 C), temperature source Oral, resp. rate 17, weight 253 lb 3.2 oz (114.9 kg), last menstrual period  09/04/2017, SpO2 97 %.  Physical Exam  Nursing note and vitals reviewed. Constitutional: She is oriented to person, place, and time. She appears well-developed and well-nourished. No distress.  HENT:  Head: Normocephalic and atraumatic.  Cardiovascular: Normal rate.  Respiratory: Effort normal.  GI: Soft. She exhibits no distension and no mass. There is no tenderness. There is no rebound and no guarding.  Neurological: She is alert and oriented to person, place, and time.  Skin: Skin is warm and dry. No erythema.  Psychiatric: She has a normal mood and affect.    Results for orders placed or performed during the hospital encounter of 11/07/17 (from the past 24 hour(s))  Urinalysis, Routine w reflex microscopic     Status: Abnormal   Collection Time: 11/07/17  4:25 PM  Result Value Ref Range   Color, Urine YELLOW YELLOW   APPearance CLEAR CLEAR   Specific Gravity, Urine 1.020 1.005 - 1.030   pH 9.0 (H) 5.0 - 8.0   Glucose, UA NEGATIVE NEGATIVE mg/dL   Hgb urine dipstick NEGATIVE NEGATIVE   Bilirubin Urine NEGATIVE NEGATIVE   Ketones, ur 5 (A) NEGATIVE mg/dL   Protein, ur 409 (A) NEGATIVE mg/dL   Nitrite NEGATIVE NEGATIVE  Leukocytes, UA NEGATIVE NEGATIVE   RBC / HPF 0-5 0 - 5 RBC/hpf   WBC, UA 0-5 0 - 5 WBC/hpf   Bacteria, UA NONE SEEN NONE SEEN   Squamous Epithelial / LPF 0-5 0 - 5   Mucus PRESENT     Patient Vitals for the past 24 hrs:  BP Temp Temp src Pulse Resp SpO2 Weight  11/07/17 1739 (!) 145/75 - - - - - -  11/07/17 1627 - - - - - - 253 lb 3.2 oz (114.9 kg)  11/07/17 1623 124/76 - - 93 - - -  11/07/17 1600 122/65 - - 91 - - -  11/07/17 1554 134/75 - - 91 - - -  11/07/17 1521 (!) 146/83 - - 80 - - -  11/07/17 1452 - - - - - 97 % -  11/07/17 1448 - - - - - 100 % -  11/07/17 1443 - - - - - 98 % -  11/07/17 1438 - - - - - 100 % -  11/07/17 1437 - - - - - 100 % -  11/07/17 1436 - - - - - 100 % -  11/07/17 1432 - - - - - 100 % -  11/07/17 1427 - - - - - 100 % -   11/07/17 1424 (!) 164/83 - - 81 17 100 % -  11/07/17 1423 - - - - - (!) 82 % -  11/07/17 1418 - - - - - 100 % -  11/07/17 1417 - - - - - (!) 89 % -  11/07/17 1416 - - - - - (!) 88 % -  11/07/17 1415 - - - - - (!) 80 % -  11/07/17 1414 - - - - - (!) 85 % -  11/07/17 1413 - - - - - (!) 84 % -  11/07/17 1412 (!) 173/90 99.1 F (37.3 C) Oral 83 20 100 % -  11/07/17 1411 - - - - - 100 % -  11/07/17 1408 - - - - - 100 % -    MAU Course  Procedures None  MDM UA today without significant dehydration In review of patient's chart she has not lost more than 1# since first visit in this pregnancy.  EKG - normal sinus rhythm BP responded well to Labetalol IV  Assessment and Plan  A: SIUP at 6763w1d Nausea and vomiting in pregnancy, first trimester CHTN  P: Discharge home Rx for Phenergan suppositories given to patient  GERD and Hyperemesis diet discussed at length Importance of labetalol discussed again Patient advised to follow-up with [facility] for routine prenatal care Patient may return to MAU as needed or if her condition were to change or worsen   Vonzella NippleJulie Noeh Sparacino, PA-C 11/07/2017, 6:22 PM

## 2017-11-07 NOTE — MAU Note (Signed)
Ongoing vomiting. Abd pain and back pain.  C/o tightness in chest, can't get her breath. (talks without issue).  "Feels like her body is giving out on her."  Not keeping meds down, did not get BP med yet.

## 2017-11-07 NOTE — Discharge Instructions (Signed)
Eating Plan for Nausea and vomiting in pregnancy Food Choices for Gastroesophageal Reflux Disease, Adult When you have gastroesophageal reflux disease (GERD), the foods you eat and your eating habits are very important. Choosing the right foods can help ease your discomfort. What guidelines do I need to follow?  Choose fruits, vegetables, whole grains, and low-fat dairy products.  Choose low-fat meat, fish, and poultry.  Limit fats such as oils, salad dressings, butter, nuts, and avocado.  Keep a food diary. This helps you identify foods that cause symptoms.  Avoid foods that cause symptoms. These may be different for everyone.  Eat small meals often instead of 3 large meals a day.  Eat your meals slowly, in a place where you are relaxed.  Limit fried foods.  Cook foods using methods other than frying.  Avoid drinking alcohol.  Avoid drinking large amounts of liquids with your meals.  Avoid bending over or lying down until 2-3 hours after eating. What foods are not recommended? These are some foods and drinks that may make your symptoms worse: Vegetables Tomatoes. Tomato juice. Tomato and spaghetti sauce. Chili peppers. Onion and garlic. Horseradish. Fruits Oranges, grapefruit, and lemon (fruit and juice). Meats High-fat meats, fish, and poultry. This includes hot dogs, ribs, ham, sausage, salami, and bacon. Dairy Whole milk and chocolate milk. Sour cream. Cream. Butter. Ice cream. Cream cheese. Drinks Coffee and tea. Bubbly (carbonated) drinks or energy drinks. Condiments Hot sauce. Barbecue sauce. Sweets/Desserts Chocolate and cocoa. Donuts. Peppermint and spearmint. Fats and Oils High-fat foods. This includes [place] fries and potato chips. Other Vinegar. Strong spices. This includes black pepper, white pepper, red pepper, cayenne, curry powder, cloves, ginger, and chili powder. The items listed above may not be a complete list of foods and drinks to avoid. Contact  your dietitian for more information. This information is not intended to replace advice given to you by your health care provider. Make sure you discuss any questions you have with your health care provider. Document Released: 01/03/2012 Document Revised: 12/10/2015 Document Reviewed: 05/08/2013 Elsevier Interactive Patient Education  2017 Elsevier Inc.  Hyperemesis gravidarum is a severe form of morning sickness. Because this condition causes severe nausea and vomiting, it can lead to dehydration, malnutrition, and weight loss. One way to lessen the symptoms of nausea and vomiting is to follow the eating plan for hyperemesis gravidarum. It is often used along with prescribed medicines to control your symptoms. What can I do to relieve my symptoms? Listen to your body. Everyone is different and has different preferences. Find what works best for you. Take any of the following actions that are helpful to you:  Eat and drink slowly.  Eat 5-6 small meals daily instead of 3 large meals.  Eat crackers before you get out of bed in the morning.  Try having a snack in the middle of the night.  Starchy foods are usually tolerated well. Examples include cereal, toast, bread, potatoes, pasta, rice, and pretzels.  Ginger may help with nausea. Add  tsp ground ginger to hot tea or choose ginger tea.  Try drinking 100% fruit juice or an electrolyte drink. An electrolyte drink contains sodium, potassium, and chloride.  Continue to take your prenatal vitamins as told by your health care provider. If you are having trouble taking your prenatal vitamins, talk with your health care provider about different options.  Include at least 1 serving of protein with your meals and snacks. Protein options include meats or poultry, beans, nuts, eggs, and yogurt.  Try eating a protein-rich snack before bed. Examples of these snacks include cheese and crackers or half of a peanut butter or [place] sandwich.  Consider  eliminating foods that trigger your symptoms. These may include spicy foods, coffee, high-fat foods, very sweet foods, and acidic foods.  Try meals that have more protein combined with bland, salty, lower-fat, and dry foods, such as nuts, seeds, pretzels, crackers, and cereal.  Talk with your healthcare provider about starting a supplement of vitamin B6.  Have fluids that are cold, clear, and carbonated or sour. Examples include lemonade, ginger ale, lemon-lime soda, ice water, and sparkling water.  Try lemon or mint tea.  Try brushing your teeth or using a mouth rinse after meals.  What should I avoid to reduce my symptoms? Avoiding some of the following things may help reduce your symptoms.  Foods with strong smells. Try eating meals in well-ventilated areas that are free of odors.  Drinking water or other beverages with meals. Try not to drink anything during the 30 minutes before and after your meals.  Drinking more than 1 cup of fluid at a time. Sometimes using a straw helps.  Fried or high-fat foods, such as butter and cream sauces.  Spicy foods.  Skipping meals as best as you can. Nausea can be more intense on an empty stomach. If you cannot tolerate food at that time, do not force it. Try sucking on ice chips or other frozen items, and make up for missed calories later.  Lying down within 2 hours after eating.  Environmental triggers. These may include smoky rooms, closed spaces, rooms with strong smells, warm or humid places, overly loud and noisy rooms, and rooms with motion or flickering lights.  Quick and sudden changes in your movement.  This information is not intended to replace advice given to you by your health care provider. Make sure you discuss any questions you have with your health care provider. Document Released: 05/01/2007 Document Revised: 03/02/2016 Document Reviewed: 02/02/2016 Elsevier Interactive Patient Education  [employer]2018 Else[employer] Inc.

## 2018-06-22 ENCOUNTER — Inpatient Hospital Stay (HOSPITAL_COMMUNITY): Payer: Medicaid Other

## 2018-06-22 ENCOUNTER — Encounter (HOSPITAL_COMMUNITY): Payer: Self-pay | Admitting: *Deleted

## 2018-06-22 ENCOUNTER — Other Ambulatory Visit: Payer: Self-pay

## 2018-06-22 ENCOUNTER — Inpatient Hospital Stay (HOSPITAL_COMMUNITY)
Admission: AD | Admit: 2018-06-22 | Discharge: 2018-06-22 | Disposition: A | Discharge status: Home / Self Care | Payer: Medicaid Other | Source: Ambulatory Visit | Attending: Obstetrics and Gynecology | Admitting: Obstetrics and Gynecology

## 2018-06-22 DIAGNOSIS — O99331 Smoking (tobacco) complicating pregnancy, first trimester: Secondary | ICD-10-CM | POA: Diagnosis not present

## 2018-06-22 DIAGNOSIS — O10011 Pre-existing essential hypertension complicating pregnancy, first trimester: Secondary | ICD-10-CM | POA: Diagnosis not present

## 2018-06-22 DIAGNOSIS — Z8249 Family history of ischemic heart disease and other diseases of the circulatory system: Secondary | ICD-10-CM | POA: Insufficient documentation

## 2018-06-22 DIAGNOSIS — O24111 Pre-existing diabetes mellitus, type 2, in pregnancy, first trimester: Secondary | ICD-10-CM | POA: Diagnosis not present

## 2018-06-22 DIAGNOSIS — Z79899 Other long term (current) drug therapy: Secondary | ICD-10-CM | POA: Diagnosis not present

## 2018-06-22 DIAGNOSIS — Z3A09 9 weeks gestation of pregnancy: Secondary | ICD-10-CM

## 2018-06-22 DIAGNOSIS — O208 Other hemorrhage in early pregnancy: Secondary | ICD-10-CM | POA: Insufficient documentation

## 2018-06-22 DIAGNOSIS — F12188 Cannabis abuse with other cannabis-induced disorder: Secondary | ICD-10-CM

## 2018-06-22 DIAGNOSIS — O219 Vomiting of pregnancy, unspecified: Secondary | ICD-10-CM | POA: Insufficient documentation

## 2018-06-22 DIAGNOSIS — O3680X0 Pregnancy with inconclusive fetal viability, not applicable or unspecified: Secondary | ICD-10-CM

## 2018-06-22 DIAGNOSIS — Z833 Family history of diabetes mellitus: Secondary | ICD-10-CM | POA: Insufficient documentation

## 2018-06-22 DIAGNOSIS — O26891 Other specified pregnancy related conditions, first trimester: Secondary | ICD-10-CM | POA: Diagnosis not present

## 2018-06-22 DIAGNOSIS — R109 Unspecified abdominal pain: Secondary | ICD-10-CM | POA: Insufficient documentation

## 2018-06-22 DIAGNOSIS — Z3201 Encounter for pregnancy test, result positive: Secondary | ICD-10-CM

## 2018-06-22 DIAGNOSIS — R1116 Cannabis hyperemesis syndrome: Secondary | ICD-10-CM

## 2018-06-22 LAB — RAPID URINE DRUG SCREEN, HOSP PERFORMED
Amphetamines: NOT DETECTED
Barbiturates: NOT DETECTED
Benzodiazepines: NOT DETECTED
Cocaine: NOT DETECTED
Opiates: NOT DETECTED
Tetrahydrocannabinol: POSITIVE — AB

## 2018-06-22 LAB — CBC WITH DIFFERENTIAL/PLATELET
BASOS PCT: 0 %
Basophils Absolute: 0 10*3/uL (ref 0.0–0.1)
Eosinophils Absolute: 0 10*3/uL (ref 0.0–0.5)
Eosinophils Relative: 0 %
HEMATOCRIT: 34.7 % — AB (ref 36.0–46.0)
Hemoglobin: 11.7 g/dL — ABNORMAL LOW (ref 12.0–15.0)
Lymphocytes Relative: 12 %
Lymphs Abs: 1.7 10*3/uL (ref 0.7–4.0)
MCH: 30.9 pg (ref 26.0–34.0)
MCHC: 33.7 g/dL (ref 30.0–36.0)
MCV: 91.6 fL (ref 80.0–100.0)
Monocytes Absolute: 0.2 10*3/uL (ref 0.1–1.0)
Monocytes Relative: 2 %
NEUTROS ABS: 12 10*3/uL — AB (ref 1.7–7.7)
Neutrophils Relative %: 86 %
Platelets: 262 10*3/uL (ref 150–400)
RBC: 3.79 MIL/uL — ABNORMAL LOW (ref 3.87–5.11)
RDW: 12.8 % (ref 11.5–15.5)
WBC: 14 10*3/uL — ABNORMAL HIGH (ref 4.0–10.5)
nRBC: 0 % (ref 0.0–0.2)

## 2018-06-22 LAB — URINALYSIS, ROUTINE W REFLEX MICROSCOPIC
BACTERIA UA: NONE SEEN
BILIRUBIN URINE: NEGATIVE
Glucose, UA: 50 mg/dL — AB
Ketones, ur: 5 mg/dL — AB
Leukocytes, UA: NEGATIVE
NITRITE: NEGATIVE
PH: 9 — AB (ref 5.0–8.0)
Protein, ur: 100 mg/dL — AB
SPECIFIC GRAVITY, URINE: 1.021 (ref 1.005–1.030)

## 2018-06-22 LAB — COMPREHENSIVE METABOLIC PANEL
ALBUMIN: 4 g/dL (ref 3.5–5.0)
ALT: 18 U/L (ref 0–44)
AST: 20 U/L (ref 15–41)
Alkaline Phosphatase: 51 U/L (ref 38–126)
Anion gap: 8 (ref 5–15)
BILIRUBIN TOTAL: 0.5 mg/dL (ref 0.3–1.2)
BUN: 11 mg/dL (ref 6–20)
CO2: 22 mmol/L (ref 22–32)
Calcium: 9.1 mg/dL (ref 8.9–10.3)
Chloride: 104 mmol/L (ref 98–111)
Creatinine, Ser: 0.62 mg/dL (ref 0.44–1.00)
GFR calc Af Amer: >60 mL/min (ref >60)
GFR calc non Af Amer: >60 mL/min (ref >60)
Glucose, Bld: 147 mg/dL — ABNORMAL HIGH (ref 70–99)
POTASSIUM: 3.6 mmol/L (ref 3.5–5.1)
Sodium: 134 mmol/L — ABNORMAL LOW (ref 135–145)
Total Protein: 7.6 g/dL (ref 6.5–8.1)

## 2018-06-22 LAB — POCT PREGNANCY, URINE: PREG TEST UR: POSITIVE — AB

## 2018-06-22 LAB — TYPE AND SCREEN
ABO/RH(D): O POS
Antibody Screen: NEGATIVE

## 2018-06-22 LAB — HCG, QUANTITATIVE, PREGNANCY: hCG, Beta Chain, Quant, S: 46982 m[IU]/mL — ABNORMAL HIGH (ref <5)

## 2018-06-22 MED ORDER — PROMETHAZINE HCL 25 MG/ML IJ SOLN
25.0000 mg | Freq: Once | INTRAVENOUS | Status: AC
  Administered 2018-06-22: 25 mg via INTRAVENOUS
  Filled 2018-06-22: qty 1

## 2018-06-22 MED ORDER — DIPHENHYDRAMINE HCL 50 MG/ML IJ SOLN: 50.0000 mg | Freq: Once | INTRAMUSCULAR | Status: DC

## 2018-06-22 MED ORDER — ONDANSETRON 4 MG PO TBDP
4.0000 mg | ORAL_TABLET | Freq: Once | ORAL | Status: AC
  Administered 2018-06-22: 4 mg via ORAL
  Filled 2018-06-22: qty 1

## 2018-06-22 MED ORDER — HALOPERIDOL LACTATE 5 MG/ML IJ SOLN
5.0000 mg | Freq: Once | INTRAMUSCULAR | Status: AC
  Administered 2018-06-22: 5 mg via INTRAVENOUS
  Filled 2018-06-22: qty 1

## 2018-06-22 MED ORDER — PREPLUS 27-1 MG PO TABS: 1.0000 | ORAL_TABLET | Freq: Every day | ORAL | 13 refills | Status: DC

## 2018-06-22 NOTE — MAU Note (Signed)
Presents with c/o N&V and abdominal pain that began this morning.  Reports has thrown up "too many times to count." Denies VB or any other problems.

## 2018-06-22 NOTE — Discharge Instructions (Signed)
Cannabinoid Hyperemesis Syndrome °Cannabinoid hyperemesis syndrome (CHS) is a condition that causes repeated nausea, vomiting, and abdominal pain after long-term (chronic) use of marijuana (cannabis). People with CHS typically use marijuana 3-5 times a day for many years before they have symptoms, although it is possible to develop CHS with as little as 1 use per day. °Symptoms of CHS may be mild at first but can get worse and more frequent. In some cases, CHS may cause vomiting many times a day, which can lead to weight loss and dehydration. CHS may go away and come back many times (recur). People may not have symptoms or may otherwise be healthy in between CHS attacks. °What are the causes? °The exact cause of this condition is not known. Long-term use of marijuana may over-stimulate certain proteins in the brain that react with chemicals in marijuana (cannabinoid receptors). This over-stimulation may cause CHS. °What are the signs or symptoms? °Symptoms of this condition are often mild during the first few attacks, but they can get worse over time. Symptoms may include: °· Frequent nausea, especially early in the morning. °· Vomiting. °· Abdominal pain. ° °Taking several hot showers throughout the day can also be a sign of this condition. People with CHS may do this because it relieves symptoms. °How is this diagnosed? °This condition may be diagnosed based on: °· Your symptoms and medical history, including any drug use. °· A physical exam. ° °You may have tests done to rule out other problems. These tests may include: °· Blood tests. °· Urine tests. °· Imaging tests, such as an X-ray or CT scan. ° °How is this treated? °Treatment for this condition involves stopping marijuana use. Your health care provider may recommend: °· A drug rehabilitation program, if you have trouble stopping marijuana use. °· Medicines for nausea. °· Hot showers to help relieve symptoms. ° °Certain creams that contain a substance called  capsaicin may improve symptoms when applied to the abdomen. Ask your health care provider before starting any medicines or other treatments. °Severe nausea and vomiting may require you to stay at the hospital. You may need IV fluids to prevent or treat dehydration. You may also need certain medicines that must be given at the hospital. °Follow these instructions at home: °During an attack °· Stay in bed and rest in a dark, quiet room. °· Take anti-nausea medicine as told by your health care provider. °· Try taking hot showers to relieve your symptoms. °After an attack °· Drink small amounts of clear fluids slowly. Gradually add more. °· Once you are able to eat without vomiting, eat soft foods in small amounts every 3-4 hours. °General instructions °· Do not use any products that contain marijuana.If you need help quitting, ask your health care provider for resources and treatment options. °· Drink enough fluid to keep your urine pale yellow. Avoid drinking fluids that have a lot of sugar or caffeine, such as coffee and soda. °· Take and apply over-the-counter and prescription medicines only as told by your health care provider. Ask your health care provider before starting any new medicines or treatments. °· Keep all follow-up visits as told by your health care provider. This is important. °Contact a health care provider if: °· Your symptoms get worse. °· You cannot drink fluids without vomiting. °· You have pain and trouble swallowing after an attack. °Get help right away if: °· You cannot stop vomiting. °· You have blood in your vomit or your vomit looks like coffee grounds. °·   You have severe abdominal pain. °· You have stools that are bloody or black, or stools that look like tar. °· You have symptoms of dehydration, such as: °? Sunken eyes. °? Inability to make tears. °? Cracked lips. °? Dry mouth. °? Decreased urine production. °? Weakness. °? Sleepiness. °? Fainting. °Summary °· Cannabinoid hyperemesis  syndrome (CHS) is a condition that causes repeated nausea, vomiting, and abdominal pain after long-term use of marijuana. °· People with CHS typically use marijuana 3-5 times a day for many years before they have symptoms, although it is possible to develop CHS with as little as 1 use per day. °· Treatment for this condition involves stopping marijuana use. Hot showers and capsaicin creams may also help relieve symptoms. Ask your health care provider before starting any medicines or other treatments. °· Your health care provider may prescribe medicines to help with nausea. °· Get help right away if you have signs of dehydration, such as dry mouth, decreased urine production, or weakness. °This information is not intended to replace advice given to you by your health care provider. Make sure you discuss any questions you have with your health care provider. °Document Released: 10/12/2016 Document Revised: 10/12/2016 Document Reviewed: 10/12/2016 °Elsevier Interactive Patient Education © 2018 [employer] ° °

## 2018-06-22 NOTE — MAU Provider Note (Signed)
History     CSN: [id number]  Arrival date and time: 06/22/18 1324   First Provider Initiated Contact with Patient 06/22/18 [phone]      Chief Complaint  Patient presents with  . Abdominal Pain  . Emesis  . Nausea   HPI Tammy Andrews is a 27 y.o. G3P0110 at [redacted]w[redacted]d who presents to MAU with chief complaint of abdominal pain. This is a new problem, onset within the past week but significantly worse early this morning. Patient endorses 10/10 abdominal pain which triggers nausea and vomiting episodes. Pain is across her low abdomen, does not radiate, no aggravating or alleviating factors. She has not taken medication or tried other treatments. She denies history of substance abuse, UTI, kidney stones. She also denies vaginal bleeding, abnormal vaginal discharge, fever or recent illness.    OB History    Gravida  3   Para  1   Term  0   Preterm  1   AB  1   Living  0     SAB  0   TAB  1   Ectopic  0   Multiple  0   Live Births  0           Past Medical History:  Diagnosis Date  . Diabetes mellitus without complication (HCC)    not now  . Preterm labor     Past Surgical History:  Procedure Laterality Date  . LAPAROSCOPIC GASTRIC BAND REMOVAL WITH LAPAROSCOPIC GASTRIC SLEEVE RESECTION  08/2014    Family History  Problem Relation Age of Onset  . Hypertension Father   . Diabetes Paternal Grandmother     Social History   Tobacco Use  . Smoking status: Current Some Day Smoker    Last attempt to quit: 2018    Years since quitting: 1.9  . Smokeless tobacco: Never Used  Substance Use Topics  . Alcohol use: Yes    Comment: socially  . Drug use: Not Currently    Types: Marijuana    Allergies: No Known Allergies  Medications Prior to Admission  Medication Sig Dispense Refill Last Dose  . acetaminophen (TYLENOL) 500 MG tablet Take 500 mg by mouth every 6 (six) hours as needed for moderate pain.   11/07/2017 at Unknown time  . famotidine (PEPCID) 20 MG  tablet Take 1 tablet (20 mg total) by mouth 2 (two) times daily. 30 tablet 2 11/07/2017 at Unknown time  . labetalol (NORMODYNE) 200 MG tablet Take 1 tablet (200 mg total) by mouth 2 (two) times daily. (Patient not taking: Reported on 11/07/2017) 60 tablet 3 Not Taking at Unknown time  . ondansetron (ZOFRAN ODT) 4 MG disintegrating tablet Take 1 tablet (4 mg total) by mouth every 6 (six) hours as needed for nausea. (Patient not taking: Reported on 11/07/2017) 20 tablet 0 Not Taking at Unknown time  . pantoprazole (PROTONIX) 20 MG tablet Take 1 tablet (20 mg total) by mouth daily. 30 tablet 2 11/06/2017 at Unknown time  . promethazine (PHENERGAN) 25 MG suppository Place 1 suppository (25 mg total) rectally every 6 (six) hours as needed for nausea or vomiting. 20 each 0   . promethazine (PHENERGAN) 25 MG tablet Take 1 tablet (25 mg total) by mouth every 6 (six) hours as needed for nausea or vomiting. 30 tablet 2 11/07/2017 at 0800    Review of Systems  Constitutional: Negative for fever.  Respiratory: Negative for shortness of breath.   Gastrointestinal: Positive for abdominal pain, nausea and vomiting.  Genitourinary:  Positive for dysuria and flank pain. Negative for difficulty urinating, vaginal bleeding, vaginal discharge and vaginal pain.  Musculoskeletal: Positive for back pain.  Neurological: Negative for dizziness and headaches.   Physical Exam   Blood pressure (!) 141/76, pulse 88, temperature 98.9 F (37.2 C), temperature source Oral, resp. rate 20, last menstrual period 04/19/2018, SpO2 100 %, unknown if currently breastfeeding.  Physical Exam  Nursing note and vitals reviewed. Constitutional: She is oriented to person, place, and time. She appears well-developed and well-nourished.  Cardiovascular: Normal rate.  Respiratory: Effort normal and breath sounds normal.  GI: Bowel sounds are normal. There is generalized tenderness. There is guarding and CVA tenderness.  Neurological: She is  alert and oriented to person, place, and time. She has normal reflexes.  Skin: Skin is warm and dry.  Psychiatric: She has a normal mood and affect. Her behavior is normal. Judgment and thought content normal.    MAU Course/MDM   --Afebrile --POSTIVIE UDS --Sleeping after IV Phenergan then very irritable upon return from ultrasound --Per patient, all symptoms resolved after IV Haldol  Patient Vitals for the past 24 hrs:  BP Temp Temp src Pulse Resp SpO2  06/22/18 1206 135/81 - - 90 18 -  06/22/18 0804 (!) 141/76 98.9 F (37.2 C) Oral 88 20 -  06/22/18 0758 (!) 145/86 98.6 F (37 C) Oral 89 20 100 %    Results for orders placed or performed during the hospital encounter of 06/22/18 (from the past 24 hour(s))  Urinalysis, Routine w reflex microscopic     Status: Abnormal   Collection Time: 06/22/18  7:57 AM  Result Value Ref Range   Color, Urine YELLOW YELLOW   APPearance CLEAR CLEAR   Specific Gravity, Urine 1.021 1.005 - 1.030   pH 9.0 (H) 5.0 - 8.0   Glucose, UA 50 (A) NEGATIVE mg/dL   Hgb urine dipstick SMALL (A) NEGATIVE   Bilirubin Urine NEGATIVE NEGATIVE   Ketones, ur 5 (A) NEGATIVE mg/dL   Protein, ur 962100 (A) NEGATIVE mg/dL   Nitrite NEGATIVE NEGATIVE   Leukocytes, UA NEGATIVE NEGATIVE   RBC / HPF 11-20 0 - 5 RBC/hpf   WBC, UA 0-5 0 - 5 WBC/hpf   Bacteria, UA NONE SEEN NONE SEEN   Squamous Epithelial / LPF 0-5 0 - 5   Mucus PRESENT   Rapid urine drug screen (hospital performed)     Status: Abnormal   Collection Time: 06/22/18  7:57 AM  Result Value Ref Range   Opiates NONE DETECTED NONE DETECTED   Cocaine NONE DETECTED NONE DETECTED   Benzodiazepines NONE DETECTED NONE DETECTED   Amphetamines NONE DETECTED NONE DETECTED   Tetrahydrocannabinol POSITIVE (A) NONE DETECTED   Barbiturates NONE DETECTED NONE DETECTED  Pregnancy, urine POC     Status: Abnormal   Collection Time: 06/22/18  8:01 AM  Result Value Ref Range   Preg Test, Ur POSITIVE (A) NEGATIVE  CBC  with Differential/Platelet     Status: Abnormal   Collection Time: 06/22/18  8:37 AM  Result Value Ref Range   WBC 14.0 (H) 4.0 - 10.5 K/uL   RBC 3.79 (L) 3.87 - 5.11 MIL/uL   Hemoglobin 11.7 (L) 12.0 - 15.0 g/dL   HCT 95.234.7 (L) 84.136.0 - 32.446.0 %   MCV 91.6 80.0 - 100.0 fL   MCH 30.9 26.0 - 34.0 pg   MCHC 33.7 30.0 - 36.0 g/dL   RDW 40.112.8 02.711.5 - 25.315.5 %   Platelets 262 150 - 400  K/uL   nRBC 0.0 0.0 - 0.2 %   Neutrophils Relative % 86 %   Neutro Abs 12.0 (H) 1.7 - 7.7 K/uL   Lymphocytes Relative 12 %   Lymphs Abs 1.7 0.7 - 4.0 K/uL   Monocytes Relative 2 %   Monocytes Absolute 0.2 0.1 - 1.0 K/uL   Eosinophils Relative 0 %   Eosinophils Absolute 0.0 0.0 - 0.5 K/uL   Basophils Relative 0 %   Basophils Absolute 0.0 0.0 - 0.1 K/uL  Comprehensive metabolic panel     Status: Abnormal   Collection Time: 06/22/18  8:37 AM  Result Value Ref Range   Sodium 134 (L) 135 - 145 mmol/L   Potassium 3.6 3.5 - 5.1 mmol/L   Chloride 104 98 - 111 mmol/L   CO2 22 22 - 32 mmol/L   Glucose, Bld 147 (H) 70 - 99 mg/dL   BUN 11 6 - 20 mg/dL   Creatinine, Ser 2.95 0.44 - 1.00 mg/dL   Calcium 9.1 8.9 - 62.1 mg/dL   Total Protein 7.6 6.5 - 8.1 g/dL   Albumin 4.0 3.5 - 5.0 g/dL   AST 20 15 - 41 U/L   ALT 18 0 - 44 U/L   Alkaline Phosphatase 51 38 - 126 U/L   Total Bilirubin 0.5 0.3 - 1.2 mg/dL   GFR calc non Af Amer >60 >60 mL/min   GFR calc Af Amer >60 >60 mL/min   Anion gap 8 5 - 15  Type and screen [facility]     Status: None   Collection Time: 06/22/18  8:37 AM  Result Value Ref Range   ABO/RH(D) O POS    Antibody Screen NEG    Sample Expiration      06/25/2018 Performed at [facility], [address] [place], [place] [address]   hCG, quantitative, pregnancy     Status: Abnormal   Collection Time: 06/22/18  8:37 AM  Result Value Ref Range   hCG, Beta Chain, Quant, S 46,982 (H) <5 mIU/mL    Meds ordered this encounter  Medications  . ondansetron (ZOFRAN-ODT)  disintegrating tablet 4 mg  . promethazine (PHENERGAN) 25 mg in lactated ringers 1,000 mL infusion  . haloperidol lactate (HALDOL) injection 5 mg    Cannabis hyperemesis  . Prenatal Vit-Fe Fumarate-FA (PREPLUS) 27-1 MG TABS    Sig: Take 1 tablet by mouth daily.    Dispense:  30 tablet    Refill:  13    Order Specific Question:   Supervising Provider    Answer:   Reva Bores [[id number]]   US Renal  Result Date: 06/22/2018 CLINICAL DATA:  Flank pain.  Hematuria. EXAM: RENAL / URINARY TRACT ULTRASOUND COMPLETE COMPARISON:  None. FINDINGS: Right Kidney: Renal measurements: 12.7 x 5.9 x 6.8 cm = volume: 265.1 mL . Echogenicity within normal limits. No mass or hydronephrosis visualized. Left Kidney: Renal measurements: 12.4 x 6.6 x 6.8 cm = volume: 258.6 mL. Echogenicity within normal limits. No mass or hydronephrosis visualized. Bladder: Appears normal for degree of bladder distention. Volume: Prevoid: 168.0 cc Postvoid: 0.31 cc IMPRESSION: No hydronephrosis. Electronically Signed   By: Annia Belt M.D.   On: 06/22/2018 11:41   US Ob Less Than 14 Weeks With Ob Transvaginal  Result Date: 06/22/2018 CLINICAL DATA:  Pregnant patient with abdominal pain. EXAM: OBSTETRIC <14 WK [place] AND TRANSVAGINAL OB US TECHNIQUE: Both transabdominal and transvaginal ultrasound examinations were performed for complete evaluation of the gestation as well as  the maternal uterus, adnexal regions, and pelvic cul-de-sac. Transvaginal technique was performed to assess early pregnancy. COMPARISON:  None. FINDINGS: Intrauterine gestational sac: Single Yolk sac:  Visualized. Embryo:  Visualized. Cardiac Activity: Visualized. Heart Rate: 130 bpm CRL:  10.1 mm   7 w   1 d                  [place] EDC: 02/07/2019 Subchorionic hemorrhage:  Large Maternal uterus/adnexae: Normal right and left ovaries. No free fluid in the pelvis. IMPRESSION: Single live intrauterine gestation.  Large subchorionic hematoma. Electronically Signed   By: Annia Belt  M.D.   On: 06/22/2018 11:39     Assessment and Plan  --27 y.o. G3P0110 at [redacted]w[redacted]d by LMP, 7w 1d by US performed today --IUP, subchorionic hemorrhage noted, pelvic rest advised --Chronic Hypertension, not taking prescribed medication. Encouraged to restart --Cannabis Hyperemesis. Encouraged to reduce use or abstain to avoid similar episodes in future --Discharge home in stable condition  Calvert Cantor, CNM 06/22/2018, 1:48 PM

## 2018-06-22 NOTE — MAU Note (Signed)
Pt C/O vomiting that started @ 0230 this morning, also mid abdominal pain that started with the vomiting.  Unsure if pregnant.  Denies bleeding.  Aborted last pregnancy due to hyperemesis.

## 2018-06-23 ENCOUNTER — Inpatient Hospital Stay (HOSPITAL_COMMUNITY)
Admission: AD | Admit: 2018-06-23 | Discharge: 2018-06-23 | Disposition: A | Discharge status: Home / Self Care | Payer: Medicaid Other | Source: Ambulatory Visit | Attending: Obstetrics and Gynecology | Admitting: Obstetrics and Gynecology

## 2018-06-23 ENCOUNTER — Encounter (HOSPITAL_COMMUNITY): Payer: Self-pay

## 2018-06-23 DIAGNOSIS — O99341 Other mental disorders complicating pregnancy, first trimester: Secondary | ICD-10-CM | POA: Insufficient documentation

## 2018-06-23 DIAGNOSIS — F12188 Cannabis abuse with other cannabis-induced disorder: Secondary | ICD-10-CM | POA: Diagnosis not present

## 2018-06-23 DIAGNOSIS — O208 Other hemorrhage in early pregnancy: Secondary | ICD-10-CM | POA: Diagnosis not present

## 2018-06-23 DIAGNOSIS — O209 Hemorrhage in early pregnancy, unspecified: Secondary | ICD-10-CM | POA: Diagnosis present

## 2018-06-23 DIAGNOSIS — Z3A09 9 weeks gestation of pregnancy: Secondary | ICD-10-CM | POA: Diagnosis not present

## 2018-06-23 DIAGNOSIS — F17200 Nicotine dependence, unspecified, uncomplicated: Secondary | ICD-10-CM | POA: Insufficient documentation

## 2018-06-23 DIAGNOSIS — F12988 Cannabis use, unspecified with other cannabis-induced disorder: Secondary | ICD-10-CM | POA: Diagnosis not present

## 2018-06-23 DIAGNOSIS — O99331 Smoking (tobacco) complicating pregnancy, first trimester: Secondary | ICD-10-CM | POA: Insufficient documentation

## 2018-06-23 DIAGNOSIS — F1290 Cannabis use, unspecified, uncomplicated: Secondary | ICD-10-CM

## 2018-06-23 MED ORDER — HALOPERIDOL LACTATE 5 MG/ML IJ SOLN
5.0000 mg | Freq: Four times a day (QID) | INTRAMUSCULAR | Status: DC | PRN
  Administered 2018-06-23: 5 mg via INTRAMUSCULAR
  Filled 2018-06-23 (×2): qty 1

## 2018-06-23 MED ORDER — ONDANSETRON 8 MG PO TBDP
8.0000 mg | ORAL_TABLET | Freq: Once | ORAL | Status: DC
  Filled 2018-06-23: qty 1

## 2018-06-23 MED ORDER — PROMETHAZINE HCL 25 MG/ML IJ SOLN
25.0000 mg | Freq: Once | INTRAMUSCULAR | Status: AC
  Administered 2018-06-23: 25 mg via INTRAMUSCULAR
  Filled 2018-06-23: qty 1

## 2018-06-23 MED ORDER — PROMETHAZINE HCL 25 MG/ML IJ SOLN: 25.0000 mg | Freq: Once | INTRAMUSCULAR | Status: DC

## 2018-06-23 NOTE — Discharge Instructions (Signed)

## 2018-06-23 NOTE — MAU Provider Note (Signed)
History     CSN: [id number]  Arrival date and time: 06/23/18 0536   First Provider Initiated Contact with Patient 06/23/18 0604      Chief Complaint  Patient presents with  . Abdominal Pain  . Vaginal Bleeding   HPI Tammy Andrews is a 27 y.o. G3P0110 at [redacted]w[redacted]d who presents with nausea, abdominal pain and vaginal bleeding. She states the pain in her belly feels the same as yesterday but got worse after smoking mariajuana. She states she saw bleeding like a period at home but none now. She has not picked up the prescribed antiemetics.  OB History    Gravida  3   Para  1   Term  0   Preterm  1   AB  1   Living  0     SAB  0   TAB  1   Ectopic  0   Multiple  0   Live Births  0           Past Medical History:  Diagnosis Date  . Diabetes mellitus without complication (HCC)    not now  . Preterm labor     Past Surgical History:  Procedure Laterality Date  . LAPAROSCOPIC GASTRIC BAND REMOVAL WITH LAPAROSCOPIC GASTRIC SLEEVE RESECTION  08/2014    Family History  Problem Relation Age of Onset  . Hypertension Father   . Diabetes Paternal Grandmother     Social History   Tobacco Use  . Smoking status: Current Some Day Smoker    Last attempt to quit: 2018    Years since quitting: 1.9  . Smokeless tobacco: Never Used  Substance Use Topics  . Alcohol use: Yes    Comment: socially  . Drug use: Not Currently    Types: Marijuana    Allergies: No Known Allergies  Medications Prior to Admission  Medication Sig Dispense Refill Last Dose  . acetaminophen (TYLENOL) 500 MG tablet Take 500 mg by mouth every 6 (six) hours as needed for moderate pain.   11/07/2017 at Unknown time  . famotidine (PEPCID) 20 MG tablet Take 1 tablet (20 mg total) by mouth 2 (two) times daily. 30 tablet 2 11/07/2017 at Unknown time  . labetalol (NORMODYNE) 200 MG tablet Take 1 tablet (200 mg total) by mouth 2 (two) times daily. (Patient not taking: Reported on 11/07/2017) 60 tablet 3  Not Taking at Unknown time  . ondansetron (ZOFRAN ODT) 4 MG disintegrating tablet Take 1 tablet (4 mg total) by mouth every 6 (six) hours as needed for nausea. (Patient not taking: Reported on 11/07/2017) 20 tablet 0 Not Taking at Unknown time  . pantoprazole (PROTONIX) 20 MG tablet Take 1 tablet (20 mg total) by mouth daily. 30 tablet 2 11/06/2017 at Unknown time  . Prenatal Vit-Fe Fumarate-FA (PREPLUS) 27-1 MG TABS Take 1 tablet by mouth daily. 30 tablet 13   . promethazine (PHENERGAN) 25 MG suppository Place 1 suppository (25 mg total) rectally every 6 (six) hours as needed for nausea or vomiting. 20 each 0   . promethazine (PHENERGAN) 25 MG tablet Take 1 tablet (25 mg total) by mouth every 6 (six) hours as needed for nausea or vomiting. 30 tablet 2 11/07/2017 at 0800    Review of Systems  Constitutional: Negative.  Negative for fatigue and fever.  HENT: Negative.   Respiratory: Negative.  Negative for shortness of breath.   Cardiovascular: Negative.  Negative for chest pain.  Gastrointestinal: Positive for abdominal pain and nausea. Negative for constipation,  diarrhea and vomiting.  Genitourinary: Positive for vaginal bleeding. Negative for dysuria.  Neurological: Negative.  Negative for dizziness and headaches.   Physical Exam   Blood pressure 125/70, pulse 76, temperature (!) 97.5 F (36.4 C), resp. rate 18, height 5' 6.5" (1.689 m), weight 112.9 kg, last menstrual period 04/19/2018, unknown if currently breastfeeding.  Physical Exam  Nursing note and vitals reviewed. Constitutional: She is oriented to person, place, and time. She appears well-developed and well-nourished. No distress.  HENT:  Head: Normocephalic.  Eyes: Pupils are equal, round, and reactive to light.  Cardiovascular: Normal rate, regular rhythm and normal heart sounds.  Respiratory: Effort normal and breath sounds normal. No respiratory distress.  GI: Soft. Bowel sounds are normal. She exhibits no distension. There  is no tenderness. There is no rebound and no guarding.  Genitourinary:  Genitourinary Comments: No blood noted  Neurological: She is alert and oriented to person, place, and time.  Skin: Skin is warm and dry.  Psychiatric: She has a normal mood and affect. Her behavior is normal. Judgment and thought content normal.    MAU Course  Procedures  MDM  Limited bedside u/s confirmed FHT of 146 bpm Haldol 5mg  IM Zofran 8mg  ODT- patient refused stating it makes her vomit more Phenergan IM  Assessment and Plan   1. Cannabinoid hyperemesis syndrome (HCC)   2. Vaginal bleeding affecting early pregnancy   3. [redacted] weeks gestation of pregnancy    -Discharge home in stable condition -Encouraged patient to pick up phenergan that was already prescribed to her -Lengthy discussion again of importance of stopping mariajuana use to prevent return of pain and vomiting -Patient advised to follow-up with OB of choice to start prenatal care -Patient may return to MAU as needed or if her condition were to change or worsen  Rolm BookbinderCaroline M Peace Jost CNM 06/23/2018, 6:05 AM

## 2018-06-23 NOTE — MAU Note (Signed)
Having vaginal bleeding this am along with abd cramping. Cramping for several days but bleeding started last night. Unable to keep down anything for few days. Prescribed phenergan yesterday but has not picked it up yet

## 2018-06-24 ENCOUNTER — Inpatient Hospital Stay (HOSPITAL_COMMUNITY)
Admission: AD | Admit: 2018-06-24 | Discharge: 2018-06-24 | Disposition: A | Discharge status: Home / Self Care | Payer: Medicaid Other | Attending: Obstetrics & Gynecology | Admitting: Obstetrics & Gynecology

## 2018-06-24 ENCOUNTER — Inpatient Hospital Stay (HOSPITAL_COMMUNITY): Payer: Medicaid Other

## 2018-06-24 ENCOUNTER — Telehealth (HOSPITAL_COMMUNITY): Payer: Self-pay

## 2018-06-24 ENCOUNTER — Encounter (HOSPITAL_COMMUNITY): Payer: Self-pay | Admitting: *Deleted

## 2018-06-24 DIAGNOSIS — O26891 Other specified pregnancy related conditions, first trimester: Secondary | ICD-10-CM | POA: Diagnosis not present

## 2018-06-24 DIAGNOSIS — F17200 Nicotine dependence, unspecified, uncomplicated: Secondary | ICD-10-CM | POA: Insufficient documentation

## 2018-06-24 DIAGNOSIS — O200 Threatened abortion: Secondary | ICD-10-CM

## 2018-06-24 DIAGNOSIS — O210 Mild hyperemesis gravidarum: Secondary | ICD-10-CM | POA: Insufficient documentation

## 2018-06-24 DIAGNOSIS — Z3A09 9 weeks gestation of pregnancy: Secondary | ICD-10-CM | POA: Insufficient documentation

## 2018-06-24 DIAGNOSIS — R102 Pelvic and perineal pain: Secondary | ICD-10-CM | POA: Diagnosis not present

## 2018-06-24 DIAGNOSIS — O99331 Smoking (tobacco) complicating pregnancy, first trimester: Secondary | ICD-10-CM | POA: Diagnosis not present

## 2018-06-24 DIAGNOSIS — O2301 Infections of kidney in pregnancy, first trimester: Secondary | ICD-10-CM

## 2018-06-24 LAB — URINALYSIS, ROUTINE W REFLEX MICROSCOPIC
Bilirubin Urine: NEGATIVE
Glucose, UA: NEGATIVE mg/dL
Ketones, ur: 20 mg/dL — AB
Leukocytes, UA: NEGATIVE
Nitrite: POSITIVE — AB
Protein, ur: >=300 mg/dL — AB
Specific Gravity, Urine: 1.032 — ABNORMAL HIGH (ref 1.005–1.030)
pH: 6 (ref 5.0–8.0)

## 2018-06-24 LAB — CBC WITH DIFFERENTIAL/PLATELET
Basophils Absolute: 0 10*3/uL (ref 0.0–0.1)
Basophils Relative: 0 %
Eosinophils Absolute: 0 10*3/uL (ref 0.0–0.5)
Eosinophils Relative: 0 %
HCT: 34.6 % — ABNORMAL LOW (ref 36.0–46.0)
Hemoglobin: 11.8 g/dL — ABNORMAL LOW (ref 12.0–15.0)
Lymphocytes Relative: 16 %
Lymphs Abs: 2.9 10*3/uL (ref 0.7–4.0)
MCH: 31.4 pg (ref 26.0–34.0)
MCHC: 34.1 g/dL (ref 30.0–36.0)
MCV: 92 fL (ref 80.0–100.0)
Monocytes Absolute: 0.8 10*3/uL (ref 0.1–1.0)
Monocytes Relative: 5 %
Neutro Abs: 14.2 10*3/uL — ABNORMAL HIGH (ref 1.7–7.7)
Neutrophils Relative %: 79 %
Platelets: 268 10*3/uL (ref 150–400)
RBC: 3.76 MIL/uL — ABNORMAL LOW (ref 3.87–5.11)
RDW: 12.8 % (ref 11.5–15.5)
WBC: 17.9 10*3/uL — ABNORMAL HIGH (ref 4.0–10.5)
nRBC: 0 % (ref 0.0–0.2)

## 2018-06-24 LAB — BASIC METABOLIC PANEL
ANION GAP: 8 (ref 5–15)
BUN: 13 mg/dL (ref 6–20)
CO2: 23 mmol/L (ref 22–32)
Calcium: 8.8 mg/dL — ABNORMAL LOW (ref 8.9–10.3)
Chloride: 104 mmol/L (ref 98–111)
Creatinine, Ser: 0.68 mg/dL (ref 0.44–1.00)
GFR calc Af Amer: >60 mL/min (ref >60)
GFR calc non Af Amer: >60 mL/min (ref >60)
GLUCOSE: 95 mg/dL (ref 70–99)
Potassium: 3.6 mmol/L (ref 3.5–5.1)
Sodium: 135 mmol/L (ref 135–145)

## 2018-06-24 LAB — WET PREP, GENITAL
Sperm: NONE SEEN
Trich, Wet Prep: NONE SEEN
Yeast Wet Prep HPF POC: NONE SEEN

## 2018-06-24 LAB — RAPID URINE DRUG SCREEN, HOSP PERFORMED
Amphetamines: NOT DETECTED
BENZODIAZEPINES: NOT DETECTED
Barbiturates: NOT DETECTED
Cocaine: NOT DETECTED
Opiates: NOT DETECTED
Tetrahydrocannabinol: POSITIVE — AB

## 2018-06-24 MED ORDER — ACETAMINOPHEN 325 MG PO TABS
650.0000 mg | ORAL_TABLET | Freq: Once | ORAL | Status: DC
  Filled 2018-06-24: qty 2

## 2018-06-24 MED ORDER — FAMOTIDINE IN NACL 20-0.9 MG/50ML-% IV SOLN
20.0000 mg | Freq: Once | INTRAVENOUS | Status: AC
  Administered 2018-06-24: 20 mg via INTRAVENOUS
  Filled 2018-06-24: qty 50

## 2018-06-24 MED ORDER — METOCLOPRAMIDE HCL 5 MG/ML IJ SOLN
10.0000 mg | Freq: Once | INTRAMUSCULAR | Status: AC
  Administered 2018-06-24: 10 mg via INTRAVENOUS
  Filled 2018-06-24: qty 2

## 2018-06-24 MED ORDER — ACETAMINOPHEN 650 MG RE SUPP
650.0000 mg | Freq: Once | RECTAL | Status: AC
  Administered 2018-06-24: 650 mg via RECTAL
  Filled 2018-06-24: qty 1

## 2018-06-24 MED ORDER — LACTATED RINGERS IV SOLN
INTRAVENOUS | Status: AC
  Administered 2018-06-24: 12:00:00 via INTRAVENOUS

## 2018-06-24 MED ORDER — DIPHENHYDRAMINE HCL 50 MG/ML IJ SOLN: 12.5000 mg | Freq: Once | INTRAMUSCULAR | Status: DC

## 2018-06-24 MED ORDER — ACETAMINOPHEN 650 MG RE SUPP: 650.0000 mg | Freq: Four times a day (QID) | RECTAL | 0 refills | Status: DC | PRN

## 2018-06-24 MED ORDER — CEPHALEXIN 500 MG PO CAPS: 500.0000 mg | ORAL_CAPSULE | Freq: Two times a day (BID) | ORAL | 0 refills | Status: AC

## 2018-06-24 MED ORDER — SODIUM CHLORIDE 0.9 % IV SOLN
1.0000 g | Freq: Once | INTRAVENOUS | Status: AC
  Administered 2018-06-24: 1 g via INTRAVENOUS
  Filled 2018-06-24 (×2): qty 10

## 2018-06-24 MED ORDER — PROMETHAZINE HCL 25 MG/ML IJ SOLN
12.5000 mg | Freq: Once | INTRAMUSCULAR | Status: AC
  Administered 2018-06-24: 12.5 mg via INTRAVENOUS
  Filled 2018-06-24: qty 1

## 2018-06-24 MED ORDER — PHENAZOPYRIDINE HCL 100 MG PO TABS
200.0000 mg | ORAL_TABLET | Freq: Once | ORAL | Status: AC
  Administered 2018-06-24: 200 mg via ORAL
  Filled 2018-06-24: qty 2

## 2018-06-24 MED ORDER — SODIUM CHLORIDE 0.9 % IV SOLN
1.0000 g | Freq: Once | INTRAVENOUS | Status: AC
  Administered 2018-06-24: 1 g via INTRAVENOUS
  Filled 2018-06-24: qty 10

## 2018-06-24 MED ORDER — PROMETHAZINE HCL 25 MG RE SUPP: 25.0000 mg | Freq: Four times a day (QID) | RECTAL | 0 refills | Status: DC | PRN

## 2018-06-24 NOTE — MAU Provider Note (Addendum)
[facility] Provider Note   CSN: [id number] Arrival date & time: 06/24/18  1023     History   Chief Complaint Chief Complaint  Patient presents with  . Emesis  . Nausea  . Abdominal Pain  . Vaginal Bleeding    HPI Tammy Andrews is a 27 y.o. G3P0110 @ [redacted]w[redacted]d gestation, 7w 3d by u/s  who presents to the MAU with c/o nausea and vomiting and vaginal bleeding. Patient reports she has been here 3 days in a row due to pain and bleeding. She reports she can't take it any more and she does not want to be here. She has had S/I but no plan. 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   HPI  Past Medical History:  Diagnosis Date  . Diabetes mellitus without complication (HCC)    not now  . Preterm labor     Patient Active Problem List   Diagnosis Date Noted  . Substance abuse (HCC) 06/22/2018    Past Surgical History:  Procedure Laterality Date  . LAPAROSCOPIC GASTRIC BAND REMOVAL WITH LAPAROSCOPIC GASTRIC SLEEVE RESECTION  08/2014     OB History    Gravida  3   Para  1   Term  0   Preterm  1   AB  1   Living  0     SAB  0   TAB  1   Ectopic  0   Multiple  0   Live Births  0            Home Medications    Prior to Admission medications   Medication Sig Start Date End Date Taking? Authorizing Provider  acetaminophen (TYLENOL) 500 MG tablet Take 500 mg by mouth every 6 (six) hours as needed for moderate pain.    [provider]  famotidine (PEPCID) 20 MG tablet Take 1 tablet (20 mg total) by mouth 2 (two) times daily. 11/04/17   Marny Lowenstein, PA-C  labetalol (NORMODYNE) 200 MG tablet Take 1 tablet (200 mg total) by mouth 2 (two) times daily. Patient not taking: Reported on 11/07/2017 11/04/17   Marny Lowenstein, PA-C  ondansetron (ZOFRAN ODT) 4 MG disintegrating tablet Take 1 tablet (4 mg total) by mouth every 6 (six) hours as needed for nausea. Patient not taking: Reported on 11/07/2017  11/03/17   Aviva Signs, CNM  pantoprazole (PROTONIX) 20 MG tablet Take 1 tablet (20 mg total) by mouth daily. 11/03/17   Aviva Signs, CNM  Prenatal Vit-Fe Fumarate-FA (PREPLUS) 27-1 MG TABS Take 1 tablet by mouth daily. 06/22/18   Calvert Cantor, CNM  promethazine (PHENERGAN) 25 MG suppository Place 1 suppository (25 mg total) rectally every 6 (six) hours as needed for nausea or vomiting. 11/07/17   Marny Lowenstein, PA-C  promethazine (PHENERGAN) 25 MG tablet Take 1 tablet (25 mg total) by mouth every 6 (six) hours as needed for nausea or vomiting. 11/04/17   Marny Lowenstein, PA-C    Family History Family History  Problem Relation Age of Onset  . Hypertension Father   . Diabetes Paternal Grandmother     Social History Social History   Tobacco Use  . Smoking status: Current Some Day Smoker    Last attempt to quit: 2018    Years since quitting: 1.9  . Smokeless tobacco: Never Used  Substance Use Topics  . Alcohol use: Yes    Comment: socially  . Drug use:  Not Currently    Types: Marijuana    Comment: Pt denied     Allergies   Patient has no known allergies.   Review of Systems Review of Systems  Gastrointestinal: Positive for nausea and vomiting.  Genitourinary: Positive for vaginal bleeding.  Psychiatric/Behavioral: Positive for suicidal ideas. The patient is nervous/anxious.      Physical Exam Updated Vital Signs BP (!) 142/92 (BP Location: Right Arm)   Pulse 78   Temp 98.2 F (36.8 C) (Oral)   Resp 18   Wt 112.3 kg   LMP 04/19/2018 (Approximate)   BMI 39.35 kg/m   Physical Exam  Constitutional: She appears well-developed and well-nourished. No distress.  HENT:  Head: Normocephalic.  Eyes: EOM are normal.  Neck: Neck supple.  Cardiovascular: Normal rate.  Pulmonary/Chest: Effort normal.  Abdominal: Soft. There is generalized tenderness. There is CVA tenderness.  Generalized tenderness with palpation  Genitourinary:  Genitourinary  Comments: External genitalia without lesions, white d/c vaginal vault. No bleeding. Cervix long, closed.   Musculoskeletal: Normal range of motion.  Neurological: She is alert.  Skin: Skin is warm and dry.  Psychiatric: Her speech is normal. Her mood appears anxious. She is agitated. She expresses suicidal ideation. She expresses no suicidal plans.  Patient crying  Nursing note and vitals reviewed.    ED Treatments / Results  Labs (all labs ordered are listed, but only abnormal results are displayed) Labs Reviewed  WET PREP, GENITAL - Abnormal; Notable for the following components:      Result Value   Clue Cells Wet Prep HPF POC PRESENT (*)    WBC, Wet Prep HPF POC FEW (*)    All other components within normal limits  URINALYSIS, ROUTINE W REFLEX MICROSCOPIC - Abnormal; Notable for the following components:   Color, Urine AMBER (*)    APPearance CLOUDY (*)    Specific Gravity, Urine 1.032 (*)    Hgb urine dipstick SMALL (*)    Ketones, ur 20 (*)    Protein, ur >=300 (*)    Nitrite POSITIVE (*)    Bacteria, UA MANY (*)    All other components within normal limits  CBC WITH DIFFERENTIAL/PLATELET - Abnormal; Notable for the following components:   WBC 17.9 (*)    RBC 3.76 (*)    Hemoglobin 11.8 (*)    HCT 34.6 (*)    Neutro Abs 14.2 (*)    All other components within normal limits  BASIC METABOLIC PANEL - Abnormal; Notable for the following components:   Calcium 8.8 (*)    All other components within normal limits  RAPID URINE DRUG SCREEN, HOSP PERFORMED - Abnormal; Notable for the following components:   Tetrahydrocannabinol POSITIVE (*)    All other components within normal limits  CULTURE, OB URINE   Radiology [place] Ob Comp Less 14 Wks  Result Date: 06/24/2018 CLINICAL DATA:  Abdominal pain and vaginal bleeding. Current assigned gestational age of [redacted] weeks 3 days by prior ultrasound. EXAM: OBSTETRIC <14 WK US AND TRANSVAGINAL OB US TECHNIQUE: Both transabdominal and  transvaginal ultrasound examinations were performed for complete evaluation of the gestation as well as the maternal uterus, adnexal regions, and pelvic cul-de-sac. Transvaginal technique was performed to assess early pregnancy. COMPARISON:  06/22/2018 FINDINGS: Intrauterine gestational sac: Single Yolk sac:  Visualized. Embryo:  Visualized. Cardiac Activity: Visualized. Heart Rate: 141 bpm CRL:  10 mm   7 w   0 d  [place] EDC: 02/10/2019 Subchorionic hemorrhage: Moderate to large subchorionic hemorrhage shows no significant change compared to recent exam. Maternal uterus/adnexae: Normal appearance of both ovaries. No adnexal mass or abnormal free fluid identified. IMPRESSION: Single living IUP with assigned gestational age of [redacted] weeks 3 days by prior ultrasound. Moderate to large subchorionic hemorrhage, without significant change. Electronically Signed   By: Myles Rosenthal M.D.   On: 06/24/2018 15:45   US Ob Transvaginal  Result Date: 06/24/2018 CLINICAL DATA:  Abdominal pain and vaginal bleeding. Current assigned gestational age of [redacted] weeks 3 days by prior ultrasound. EXAM: OBSTETRIC <14 WK [place] AND TRANSVAGINAL OB US TECHNIQUE: Both transabdominal and transvaginal ultrasound examinations were performed for complete evaluation of the gestation as well as the maternal uterus, adnexal regions, and pelvic cul-de-sac. Transvaginal technique was performed to assess early pregnancy. COMPARISON:  06/22/2018 FINDINGS: Intrauterine gestational sac: Single Yolk sac:  Visualized. Embryo:  Visualized. Cardiac Activity: Visualized. Heart Rate: 141 bpm CRL:  10 mm   7 w   0 d                  [place] EDC: 02/10/2019 Subchorionic hemorrhage: Moderate to large subchorionic hemorrhage shows no significant change compared to recent exam. Maternal uterus/adnexae: Normal appearance of both ovaries. No adnexal mass or abnormal free fluid identified. IMPRESSION: Single living IUP with assigned gestational age of [redacted] weeks 3 days by  prior ultrasound. Moderate to large subchorionic hemorrhage, without significant change. Electronically Signed   By: Myles Rosenthal M.D.   On: 06/24/2018 15:45    Procedures Procedures (including critical care time)  Medications Ordered in ED Medications  lactated ringers infusion ( Intravenous New Bag/Given 06/24/18 1214)  diphenhydrAMINE (BENADRYL) injection 12.5 mg (has no administration in time range)  cefTRIAXone (ROCEPHIN) 1 g in sodium chloride 0.9 % 100 mL IVPB (1 g Intravenous New Bag/Given 06/24/18 1629)  promethazine (PHENERGAN) injection 12.5 mg (12.5 mg Intravenous Given 06/24/18 1247)  famotidine (PEPCID) IVPB 20 mg premix (20 mg Intravenous New Bag/Given 06/24/18 1215)  acetaminophen (TYLENOL) suppository 650 mg (650 mg Rectal Given 06/24/18 1342)  cefTRIAXone (ROCEPHIN) 1 g in sodium chloride 0.9 % 100 mL IVPB (1 g Intravenous New Bag/Given 06/24/18 1458)  phenazopyridine (PYRIDIUM) tablet 200 mg (200 mg Oral Given 06/24/18 1627)  metoCLOPramide (REGLAN) injection 10 mg (10 mg Intravenous Given 06/24/18 1540)   Ultrasound shows viable 7 week IUP with FHR 140   Initial Impression / Assessment and Plan / ED Course  I have reviewed the triage vital signs and the nursing notes.   Final Clinical Impressions(s) / ED Diagnoses   Care turned over to Gwenevere Abbot, MD @ 4:45 PM ED Discharge Orders    None     Threatened miscarriage - Plan: Discharge patient - subchorionic hemorrhage stable on recent US - viable IUP - reassurance provided - return precautions discussed  - follow up as scheduled or PRN   Pelvic pain in pregnancy, antepartum, first trimester - Plan: US OB Comp Less 14 Wks, US OB Comp Less 14 Wks, US OB Transvaginal, US OB Transvaginal - patient requesting tylenol suppositories, which were prescribed - follow up PRN   Pyelonephritis affecting pregnancy in first trimester - received ceftriaxone here - discharged on cephalexin 500mg  BID x 6 additional days -  phenergan suppositories prescribed for nausea - return precautions discussed - follow up as scheduled or PRN  Gwenevere Abbot, MD

## 2018-06-24 NOTE — BH Assessment (Signed)
Tele Assessment Note   Patient Name: Tammy Andrews MRN: [id number] Referring Physician: EDP Location of Patient: [facility] Location of Provider: Behavioral Health TTS Department  Tammy Andrews is a 27 y.o. female who presented to [facility] with complaint of abdominal pain and emotional distress.  Per report, Pt presented to [facility] every day for the last three days with complaint of abdominal pain and other concerns.  Per report, Pt is pregnant and is due in July 2020.  She lives in [place] with husband and step-son.  The consult was called because Pt reported significant abdominal pain today, and she reported that she could not tolerate the pain and would rather die.  Author spoke with Pt's provider who reported that Pt came to the hospital today with concerns that she was going to have a miscarriage.  Author spoke with Pt.  Pt was very tearful during assessment and complained of physical pain.  Pt reported that she felt suicidal because of the pain, and also ''because of the other things -- I can't eat, I can't even drink water because of this.''  Pt reported physical pain for about a week.  Pt confirmed that she would not feel suicidal if the physical pain resolved.  Pt denied past suicidal ideation or attempts, homicidal ideation, hallucination.  During conversation with T. Money, NP, Pt reported distress out of fear that she is going to have a miscarriage (Pt reported that she has had one previous miscarriage and an abortion).  During assessment, Pt presented as alert and oriented.  She had fair eye contact and was cooperative.  Pt was dressed in scrubs, and she appeared appropriately groomed.  Pt's mood was depressed, and affect was tearful.  Pt endorsed suicidal ideation due to physical pain.  Pt's speech was normal in rate, rhythm, and volume.  Thought processes were within normal range, and thought content was logical and goal-oriented.  There was no evidence of delusion.  Pt's  memory and concentration were intact.  Insight and judgment were fair to poor. Impulse control was fair.  Consulted with T. Money, NP, who also spoke with Pt.  Pt to be discharged..  Recommended follow-up at [facility] outpatient or other appropriate location.  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 Diabetes mellitus without complication (HCC)    not now  . Preterm labor     Past Surgical History:  Procedure Laterality Date  . LAPAROSCOPIC GASTRIC BAND REMOVAL WITH LAPAROSCOPIC GASTRIC SLEEVE RESECTION  08/2014    Family History:  Family History  Problem Relation Age of Onset  . Hypertension Father   . Diabetes Paternal Grandmother     Social History:  reports that she has been smoking. She has never used smokeless tobacco. She reports that she drinks alcohol. She reports that she has current or past drug history. Drug: Marijuana.  Additional Social History:  Alcohol / Drug Use Pain Medications: See MAR Prescriptions: See MAR Over the Counter: See MAR History of alcohol / drug use?: No history of alcohol / drug abuse  CIWA: CIWA-Ar BP: (!) 142/92 Pulse Rate: 78 COWS:    Allergies: No Known Allergies  Home Medications:  Medications Prior to Admission  Medication Sig Dispense Refill  . acetaminophen (TYLENOL) 500 MG tablet Take 500 mg by mouth every 6 (six) hours as needed for moderate pain.    . famotidine (PEPCID) 20 MG tablet Take 1 tablet (20 mg  total) by mouth 2 (two) times daily. 30 tablet 2  . labetalol (NORMODYNE) 200 MG tablet Take 1 tablet (200 mg total) by mouth 2 (two) times daily. (Patient not taking: Reported on 11/07/2017) 60 tablet 3  . ondansetron (ZOFRAN ODT) 4 MG disintegrating tablet Take 1 tablet (4 mg total) by mouth every 6 (six) hours as needed for nausea. (Patient not taking: Reported on 11/07/2017) 20 tablet 0  . pantoprazole (PROTONIX) 20 MG tablet  Take 1 tablet (20 mg total) by mouth daily. 30 tablet 2  . Prenatal Vit-Fe Fumarate-FA (PREPLUS) 27-1 MG TABS Take 1 tablet by mouth daily. 30 tablet 13  . promethazine (PHENERGAN) 25 MG suppository Place 1 suppository (25 mg total) rectally every 6 (six) hours as needed for nausea or vomiting. 20 each 0  . promethazine (PHENERGAN) 25 MG tablet Take 1 tablet (25 mg total) by mouth every 6 (six) hours as needed for nausea or vomiting. 30 tablet 2    OB/GYN Status:  Patient's last menstrual period was 04/19/2018 (approximate).  General Assessment Data Location of Assessment: [facility] TTS Assessment: In system Is this a Tele or Face-to-Face Assessment?: Tele Assessment Is this an Initial Assessment or a Re-assessment for this encounter?: Initial Assessment Patient Accompanied by:: N/A Language Other than English: No Living Arrangements: Other (Comment) What gender do you identify as?: Female Marital status: Married Pregnancy Status: Yes (Comment: include estimated delivery date)(Due 7/20) Living Arrangements: Spouse/significant other Can pt return to current living arrangement?: Yes Admission Status: Voluntary Is patient capable of signing voluntary admission?: Yes Referral Source: Self/Family/Friend Insurance type: [place] M[employer]     Crisis Care Plan Living Arrangements: Spouse/significant other Name of Psychiatrist: None Name of Therapist: None  Education Status Is patient currently in school?: No Is the patient employed, unemployed or receiving disability?: Employed  Risk to self with the past 6 months Suicidal Ideation: Yes-Currently Present Has patient been a risk to self within the past 6 months prior to admission? : No Suicidal Intent: No Has patient had any suicidal intent within the past 6 months prior to admission? : No Is patient at risk for suicide?: No Suicidal Plan?: No Has patient had any suicidal plan within the past 6 months prior to admission? : No Access to Means:  No What has been your use of drugs/alcohol within the last 12 months?: Pt denied Previous Attempts/Gestures: No Intentional Self Injurious Behavior: None Family Suicide History: No Recent stressful life event(s): Other (Comment)(Pregnancy) Persecutory voices/beliefs?: Yes Depression: Yes Depression Symptoms: Tearfulness, Insomnia, Despondent, Feeling worthless/self pity, Loss of interest in usual pleasures Substance abuse history and/or treatment for substance abuse?: No Suicide prevention information given to non-admitted patients: Not applicable  Risk to Others within the past 6 months Homicidal Ideation: No Does patient have any lifetime risk of violence toward others beyond the six months prior to admission? : No Thoughts of Harm to Others: No Current Homicidal Intent: No Current Homicidal Plan: No Access to Homicidal Means: No History of harm to others?: No Assessment of Violence: None Noted Does patient have access to weapons?: No Criminal Charges Pending?: No Does patient have a court date: No Is patient on probation?: No  Psychosis Hallucinations: None noted Delusions: None noted  Mental Status Report Appearance/Hygiene: Unremarkable, In scrubs Eye Contact: Fair Motor Activity: Echopraxia, Unremarkable Speech: Rapid Level of Consciousness: Alert, Crying Mood: Depressed Affect: Depressed(Crying) Anxiety Level: None Thought Processes: Circumstantial Judgement: Partial Orientation: Person, Place, Time Obsessive Compulsive Thoughts/Behaviors: None  Cognitive Functioning Concentration: Fair  Memory: Recent Intact, Remote Intact Is patient IDD: No Insight: Poor Impulse Control: Fair Appetite: Fair Have you had any weight changes? : No Change Sleep: Decreased Vegetative Symptoms: Unable to Assess  ADLScreening [facility] Assessment Services) Patient's cognitive ability adequate to safely complete daily activities?: Yes Patient able to express need for assistance  with ADLs?: Yes Independently performs ADLs?: Yes (appropriate for developmental age)  Prior Inpatient Therapy Prior Inpatient Therapy: No  Prior Outpatient Therapy Prior Outpatient Therapy: No  ADL Screening (condition at time of admission) Patient's cognitive ability adequate to safely complete daily activities?: Yes Is the patient deaf or have difficulty hearing?: No Does the patient have difficulty seeing, even when wearing glasses/contacts?: No Does the patient have difficulty concentrating, remembering, or making decisions?: No Patient able to express need for assistance with ADLs?: Yes Does the patient have difficulty dressing or bathing?: No Independently performs ADLs?: Yes (appropriate for developmental age) Does the patient have difficulty walking or climbing stairs?: No Weakness of Legs: None Weakness of Arms/Hands: None  Home Assistive Devices/Equipment Home Assistive Devices/Equipment: None  Therapy Consults (therapy consults require a physician order) PT Evaluation Needed: No OT Evalulation Needed: No SLP Evaluation Needed: No Abuse/Neglect Assessment (Assessment to be complete while patient is alone) Abuse/Neglect Assessment Can Be Completed: Yes Physical Abuse: Denies Verbal Abuse: Denies Sexual Abuse: Denies Exploitation of patient/patient's resources: Denies Self-Neglect: Denies Values / Beliefs Cultural Requests During Hospitalization: None Spiritual Requests During Hospitalization: None Consults Spiritual Care Consult Needed: No Social Work Consult Needed: No Merchant navy officerAdvance Directives (For Healthcare) Does Patient Have a Medical Advance Directive?: No Would patient like information on creating a medical advance directive?: No - Patient declined Nutrition Screen- [facility] Adult/[facility]/AP Patient's home diet: Regular        Disposition:  Disposition Initial Assessment Completed for this Encounter: Yes  This service was provided via telemedicine using a 2-way,  interactive audio and video technology.  Names of all persons participating in this telemedicine service and their role in this encounter. Name: Jeanpaul,Tammy Andrews Role: Patient             Earline Mayotteugene T Halee Glynn 06/24/2018 1:35 PM

## 2018-06-24 NOTE — MAU Note (Signed)
Has been sick for a few days  Today is really bad, unable to take the phenergan  Saw blood in pee yesterday, saw some clots yesterday   Abdominal pain, grabbing feeling, 10/10  Marijuana use a few days ago

## 2018-06-24 NOTE — Progress Notes (Signed)
Patient is seen by me via tele-psych and I have consulted with Dr. Lucianne MussKumar.  Patient reports that she is having abdominal pain due to her pregnancy and because of the pain and her nausea vomiting she has had suicidal thoughts.  She states that she has had 2 previous miscarriages and that she has a intense concern over having another miscarriage.  [facility] the patient has had 1 miscarriage and 1 abortion.  Patient also states that if her pain is resolved and she would not have any suicidal thoughts.  She states that everything is resolving around her pain due to her pregnancy.  Patient does admit that she would like to have a therapist to talk to and have requested social work send over some information for follow-up appointments.  Patient is asked if she felt she needed to be in the hospital and she stated that if her pain was not fixed and it would do no good to lay around in the hospital.  Patient is strictly seeking medication or treatment for her reported abdominal pain, even [facility] have reported to her that her there are no abnormalities with her pregnancy at this time and they can find no bleeding at this time.  At this time patient does not meet inpatient criteria and is psychiatrically cleared.  I have contacted nurse practitioner, [facility] at [facility] and notified her of the recommendations.

## 2018-06-24 NOTE — Progress Notes (Signed)
Pt verbalized that she has felt like harming herself in the last couple of weeks.  She states that she feels like she would rather be dead sometimes because of that abdominal pain and vomiting.  Provider notified and there is a tech/sitter at the bedside.

## 2018-06-25 ENCOUNTER — Encounter (HOSPITAL_COMMUNITY): Payer: Self-pay

## 2018-06-25 ENCOUNTER — Ambulatory Visit: Payer: Medicaid Other | Admitting: Family Medicine

## 2018-06-26 ENCOUNTER — Encounter (HOSPITAL_COMMUNITY): Payer: Self-pay

## 2018-06-26 ENCOUNTER — Inpatient Hospital Stay (HOSPITAL_COMMUNITY)
Admission: AD | Admit: 2018-06-26 | Discharge: 2018-06-26 | Disposition: A | Discharge status: Home / Self Care | Payer: Medicaid Other | Attending: Obstetrics & Gynecology | Admitting: Obstetrics & Gynecology

## 2018-06-26 DIAGNOSIS — O99331 Smoking (tobacco) complicating pregnancy, first trimester: Secondary | ICD-10-CM | POA: Diagnosis not present

## 2018-06-26 DIAGNOSIS — R109 Unspecified abdominal pain: Secondary | ICD-10-CM

## 2018-06-26 DIAGNOSIS — Z3A09 9 weeks gestation of pregnancy: Secondary | ICD-10-CM | POA: Insufficient documentation

## 2018-06-26 DIAGNOSIS — O26899 Other specified pregnancy related conditions, unspecified trimester: Secondary | ICD-10-CM

## 2018-06-26 DIAGNOSIS — O26891 Other specified pregnancy related conditions, first trimester: Secondary | ICD-10-CM | POA: Insufficient documentation

## 2018-06-26 DIAGNOSIS — F17200 Nicotine dependence, unspecified, uncomplicated: Secondary | ICD-10-CM | POA: Insufficient documentation

## 2018-06-26 LAB — CBC WITH DIFFERENTIAL/PLATELET
Basophils Absolute: 0 10*3/uL (ref 0.0–0.1)
Basophils Relative: 0 %
EOS ABS: 0.1 10*3/uL (ref 0.0–0.5)
Eosinophils Relative: 1 %
HCT: 33.3 % — ABNORMAL LOW (ref 36.0–46.0)
HEMOGLOBIN: 11.4 g/dL — AB (ref 12.0–15.0)
Lymphocytes Relative: 21 %
Lymphs Abs: 3 10*3/uL (ref 0.7–4.0)
MCH: 31.5 pg (ref 26.0–34.0)
MCHC: 34.2 g/dL (ref 30.0–36.0)
MCV: 92 fL (ref 80.0–100.0)
Monocytes Absolute: 0.4 10*3/uL (ref 0.1–1.0)
Monocytes Relative: 3 %
NEUTROS PCT: 75 %
Neutro Abs: 10.9 10*3/uL — ABNORMAL HIGH (ref 1.7–7.7)
Platelets: 246 10*3/uL (ref 150–400)
RBC: 3.62 MIL/uL — ABNORMAL LOW (ref 3.87–5.11)
RDW: 12.7 % (ref 11.5–15.5)
WBC: 14.3 10*3/uL — ABNORMAL HIGH (ref 4.0–10.5)
nRBC: 0 % (ref 0.0–0.2)

## 2018-06-26 LAB — URINALYSIS, ROUTINE W REFLEX MICROSCOPIC
Bilirubin Urine: NEGATIVE
Glucose, UA: NEGATIVE mg/dL
Hgb urine dipstick: NEGATIVE
Ketones, ur: 5 mg/dL — AB
Leukocytes, UA: NEGATIVE
Nitrite: NEGATIVE
Protein, ur: 100 mg/dL — AB
Specific Gravity, Urine: 1.027 (ref 1.005–1.030)
pH: 6 (ref 5.0–8.0)

## 2018-06-26 LAB — CULTURE, OB URINE: Culture: >=100000 — AB

## 2018-06-26 MED ORDER — FAMOTIDINE 20 MG PO TABS: 20.0000 mg | ORAL_TABLET | Freq: Two times a day (BID) | ORAL | 2 refills | Status: DC

## 2018-06-26 MED ORDER — METOCLOPRAMIDE HCL 10 MG PO TABS: 10.0000 mg | ORAL_TABLET | Freq: Four times a day (QID) | ORAL | 0 refills | Status: DC

## 2018-06-26 MED ORDER — METOCLOPRAMIDE HCL 10 MG PO TABS
10.0000 mg | ORAL_TABLET | Freq: Once | ORAL | Status: AC
  Administered 2018-06-26: 10 mg via ORAL
  Filled 2018-06-26: qty 1

## 2018-06-26 MED ORDER — PHENAZOPYRIDINE HCL 200 MG PO TABS: 200.0000 mg | ORAL_TABLET | Freq: Three times a day (TID) | ORAL | 0 refills | Status: DC

## 2018-06-26 NOTE — MAU Provider Note (Signed)
History     CSN: [id number]  Arrival date and time: 06/26/18 1611   First Provider Initiated Contact with Patient 06/26/18 1825      Chief Complaint  Patient presents with  . Emesis  . Vaginal Bleeding  . Abdominal Pain   HPI Tammy Andrews is a 27 y.o. G3P0110 at [redacted]w[redacted]d who presents with abdominal pain, nausea and trouble sleeping. These are the same complaints she has been seen for in MAU for the past 3 visits. There is no change in her symptoms. She has tried phenergan with some relief but it does not take the nausea away. She has not started her pepcid. She is demanding answers for her pain. Denies any suicidal or homicidal ideations at this time.  She also reports trouble sleeping.   OB History    Gravida  3   Para  1   Term  0   Preterm  1   AB  1   Living  0     SAB  0   TAB  1   Ectopic  0   Multiple  0   Live Births  0           Past Medical History:  Diagnosis Date  . Diabetes mellitus without complication (HCC)    not now  . Preterm labor     Past Surgical History:  Procedure Laterality Date  . LAPAROSCOPIC GASTRIC BAND REMOVAL WITH LAPAROSCOPIC GASTRIC SLEEVE RESECTION  08/2014    Family History  Problem Relation Age of Onset  . Hypertension Father   . Diabetes Paternal Grandmother     Social History   Tobacco Use  . Smoking status: Current Some Day Smoker    Last attempt to quit: 2018    Years since quitting: 1.9  . Smokeless tobacco: Never Used  Substance Use Topics  . Alcohol use: Yes    Comment: socially  . Drug use: Not Currently    Types: Marijuana    Comment: Pt denied    Allergies: No Known Allergies  Medications Prior to Admission  Medication Sig Dispense Refill Last Dose  . acetaminophen (TYLENOL) 500 MG tablet Take 500 mg by mouth every 6 (six) hours as needed for moderate pain.   11/07/2017 at Unknown time  . acetaminophen (TYLENOL) 650 MG suppository Place 1 suppository (650 mg total) rectally every 6 (six)  hours as needed. 24 suppository 0   . cephALEXin (KEFLEX) 500 MG capsule Take 1 capsule (500 mg total) by mouth 2 (two) times daily for 6 days. 12 capsule 0   . famotidine (PEPCID) 20 MG tablet Take 1 tablet (20 mg total) by mouth 2 (two) times daily. 30 tablet 2 11/07/2017 at Unknown time  . labetalol (NORMODYNE) 200 MG tablet Take 1 tablet (200 mg total) by mouth 2 (two) times daily. (Patient not taking: Reported on 11/07/2017) 60 tablet 3 Not Taking at Unknown time  . ondansetron (ZOFRAN ODT) 4 MG disintegrating tablet Take 1 tablet (4 mg total) by mouth every 6 (six) hours as needed for nausea. (Patient not taking: Reported on 11/07/2017) 20 tablet 0 Not Taking at Unknown time  . pantoprazole (PROTONIX) 20 MG tablet Take 1 tablet (20 mg total) by mouth daily. 30 tablet 2 11/06/2017 at Unknown time  . Prenatal Vit-Fe Fumarate-FA (PREPLUS) 27-1 MG TABS Take 1 tablet by mouth daily. 30 tablet 13   . promethazine (PHENERGAN) 25 MG suppository Place 1 suppository (25 mg total) rectally every 6 (six) hours as  needed for nausea or vomiting. 20 each 0   . promethazine (PHENERGAN) 25 MG suppository Place 1 suppository (25 mg total) rectally every 6 (six) hours as needed for nausea or vomiting. 24 each 0   . promethazine (PHENERGAN) 25 MG tablet Take 1 tablet (25 mg total) by mouth every 6 (six) hours as needed for nausea or vomiting. 30 tablet 2 11/07/2017 at 0800    Review of Systems  Constitutional: Negative.  Negative for fatigue and fever.  HENT: Negative.   Respiratory: Negative.  Negative for shortness of breath.   Cardiovascular: Negative.  Negative for chest pain.  Gastrointestinal: Positive for abdominal pain, nausea and vomiting. Negative for constipation and diarrhea.  Genitourinary: Negative.  Negative for dysuria.  Neurological: Negative.  Negative for dizziness and headaches.  Psychiatric/Behavioral: Positive for sleep disturbance.   Physical Exam   Blood pressure 106/65, pulse 79,  temperature 98.6 F (37 C), temperature source Oral, resp. rate 18, weight 110.2 kg, last menstrual period 04/19/2018, unknown if currently breastfeeding.  Physical Exam  Nursing note and vitals reviewed. Constitutional: She is oriented to person, place, and time. She appears well-developed and well-nourished. No distress.  HENT:  Head: Normocephalic.  Eyes: Pupils are equal, round, and reactive to light.  Cardiovascular: Normal rate, regular rhythm and normal heart sounds.  Respiratory: Effort normal and breath sounds normal. No respiratory distress.  GI: Soft. Bowel sounds are normal. She exhibits no distension. There is no tenderness. There is no rebound and no guarding.  Neurological: She is alert and oriented to person, place, and time.  Skin: Skin is warm and dry.  Psychiatric: She has a normal mood and affect. Her behavior is normal. Judgment and thought content normal.    MAU Course  Procedures Results for orders placed or performed during the hospital encounter of 06/26/18 (from the past 24 hour(s))  Urinalysis, Routine w reflex microscopic     Status: Abnormal   Collection Time: 06/26/18  4:53 PM  Result Value Ref Range   Color, Urine AMBER (A) YELLOW   APPearance HAZY (A) CLEAR   Specific Gravity, Urine 1.027 1.005 - 1.030   pH 6.0 5.0 - 8.0   Glucose, UA NEGATIVE NEGATIVE mg/dL   Hgb urine dipstick NEGATIVE NEGATIVE   Bilirubin Urine NEGATIVE NEGATIVE   Ketones, ur 5 (A) NEGATIVE mg/dL   Protein, ur 295 (A) NEGATIVE mg/dL   Nitrite NEGATIVE NEGATIVE   Leukocytes, UA NEGATIVE NEGATIVE   RBC / HPF 0-5 0 - 5 RBC/hpf   WBC, UA 0-5 0 - 5 WBC/hpf   Bacteria, UA RARE (A) NONE SEEN   Squamous Epithelial / LPF 6-10 0 - 5   Mucus PRESENT   CBC with Differential/Platelet     Status: Abnormal   Collection Time: 06/26/18  5:34 PM  Result Value Ref Range   WBC 14.3 (H) 4.0 - 10.5 K/uL   RBC 3.62 (L) 3.87 - 5.11 MIL/uL   Hemoglobin 11.4 (L) 12.0 - 15.0 g/dL   HCT 62.1 (L)  30.8 - 46.0 %   MCV 92.0 80.0 - 100.0 fL   MCH 31.5 26.0 - 34.0 pg   MCHC 34.2 30.0 - 36.0 g/dL   RDW 65.7 84.6 - 96.2 %   Platelets 246 150 - 400 K/uL   nRBC 0.0 0.0 - 0.2 %   Neutrophils Relative % 75 %   Neutro Abs 10.9 (H) 1.7 - 7.7 K/uL   Lymphocytes Relative 21 %   Lymphs Abs 3.0 0.7 -  4.0 K/uL   Monocytes Relative 3 %   Monocytes Absolute 0.4 0.1 - 1.0 K/uL   Eosinophils Relative 1 %   Eosinophils Absolute 0.1 0.0 - 0.5 K/uL   Basophils Relative 0 %   Basophils Absolute 0.0 0.0 - 0.1 K/uL   MDM UA CBC with Diff Reglan PO Limited bedside u/s confirmed FHT of 155 bpm  Improving WBCs from last visit and patient still on PO antibiotics. Abdomen soft, nontender with stable vital signs. Low suspicion for any acute processes at this time.  Lengthy discussion with patient about pain triggers. Patient states she had this same pain in her first pregnancy where she lost the baby at 6 months. She states when she got pregnant the second time, she terminated because the pain came back. She states this pain is only there when she is pregnant. Discussed with patient possibility of psychological component to pain related to her first pregnancy loss. Patient verbalized that she does feel increased anxiety during pregnancy related to possible miscarriage. [facility] and patient reassured that pregnancy is developing normally at this time.   Assessment and Plan   1. Abdominal pain affecting pregnancy   2. [redacted] weeks gestation of pregnancy    -Discharge home in stable condition -Rx for pepcid, reglan and pyridium sent to pharmacy -First trimester precautions discussed -Patient [facility] -Patient may return to MAU as needed or if her condition were to change or worsen    Rolm BookbinderCaroline M Colleen Donahoe CNM 06/26/2018, 6:25 PM

## 2018-06-26 NOTE — Discharge Instructions (Signed)
Insomnia: Benadryl (alcohol free) 25mg  every 6 hours as needed Tylenol PM Unisom, no Gelcaps

## 2018-06-26 NOTE — MAU Note (Addendum)
Pt states "my stomach is still in knots.  I can't function like this."  States she isn't holding anything down, unable to sleep.  Is bleeding today, has changed 3 pads.Pt states she has had two diarrhea stools today which are black.

## 2018-06-26 NOTE — MAU Note (Signed)
Pt called, not in lobby 

## 2018-06-26 NOTE — MAU Note (Signed)
Pt called for second time, not in lobby. 

## 2018-06-27 ENCOUNTER — Telehealth: Payer: Self-pay | Admitting: Certified Nurse Midwife

## 2018-06-27 MED ORDER — NITROFURANTOIN MONOHYD MACRO 100 MG PO CAPS: 100.0000 mg | ORAL_CAPSULE | Freq: Two times a day (BID) | ORAL | 0 refills | Status: DC

## 2018-06-27 NOTE — Telephone Encounter (Signed)
[facility] back[facility] Pt notified. Rx sent

## 2018-07-16 ENCOUNTER — Encounter (HOSPITAL_COMMUNITY): Payer: Self-pay | Admitting: *Deleted

## 2018-07-16 ENCOUNTER — Inpatient Hospital Stay (HOSPITAL_COMMUNITY)
Admission: AD | Admit: 2018-07-16 | Discharge: 2018-07-16 | Disposition: A | Discharge status: Home / Self Care | Payer: Medicaid Other | Attending: Obstetrics & Gynecology | Admitting: Obstetrics & Gynecology

## 2018-07-16 DIAGNOSIS — Z79899 Other long term (current) drug therapy: Secondary | ICD-10-CM | POA: Insufficient documentation

## 2018-07-16 DIAGNOSIS — Z3A12 12 weeks gestation of pregnancy: Secondary | ICD-10-CM | POA: Insufficient documentation

## 2018-07-16 DIAGNOSIS — R109 Unspecified abdominal pain: Secondary | ICD-10-CM | POA: Diagnosis present

## 2018-07-16 DIAGNOSIS — N939 Abnormal uterine and vaginal bleeding, unspecified: Secondary | ICD-10-CM | POA: Insufficient documentation

## 2018-07-16 DIAGNOSIS — O4691 Antepartum hemorrhage, unspecified, first trimester: Secondary | ICD-10-CM | POA: Diagnosis not present

## 2018-07-16 DIAGNOSIS — O468X2 Other antepartum hemorrhage, second trimester: Secondary | ICD-10-CM | POA: Diagnosis not present

## 2018-07-16 DIAGNOSIS — O26891 Other specified pregnancy related conditions, first trimester: Secondary | ICD-10-CM | POA: Diagnosis not present

## 2018-07-16 DIAGNOSIS — O418X10 Other specified disorders of amniotic fluid and membranes, first trimester, not applicable or unspecified: Secondary | ICD-10-CM

## 2018-07-16 DIAGNOSIS — Z87891 Personal history of nicotine dependence: Secondary | ICD-10-CM | POA: Insufficient documentation

## 2018-07-16 DIAGNOSIS — O26892 Other specified pregnancy related conditions, second trimester: Secondary | ICD-10-CM

## 2018-07-16 DIAGNOSIS — O4690 Antepartum hemorrhage, unspecified, unspecified trimester: Secondary | ICD-10-CM

## 2018-07-16 DIAGNOSIS — O418X20 Other specified disorders of amniotic fluid and membranes, second trimester, not applicable or unspecified: Secondary | ICD-10-CM

## 2018-07-16 DIAGNOSIS — R1030 Lower abdominal pain, unspecified: Secondary | ICD-10-CM | POA: Insufficient documentation

## 2018-07-16 DIAGNOSIS — O468X1 Other antepartum hemorrhage, first trimester: Secondary | ICD-10-CM

## 2018-07-16 DIAGNOSIS — O219 Vomiting of pregnancy, unspecified: Secondary | ICD-10-CM | POA: Diagnosis not present

## 2018-07-16 HISTORY — DX: Major depressive disorder, single episode, unspecified: F32.9

## 2018-07-16 HISTORY — DX: Anxiety disorder, unspecified: F41.9

## 2018-07-16 HISTORY — DX: Depression, unspecified: F32.A

## 2018-07-16 LAB — URINALYSIS, ROUTINE W REFLEX MICROSCOPIC
Bacteria, UA: NONE SEEN
Glucose, UA: NEGATIVE mg/dL
Ketones, ur: 80 mg/dL — AB
Leukocytes, UA: NEGATIVE
Nitrite: NEGATIVE
Protein, ur: >=300 mg/dL — AB
Specific Gravity, Urine: 1.03 (ref 1.005–1.030)
pH: 6 (ref 5.0–8.0)

## 2018-07-16 MED ORDER — METOCLOPRAMIDE HCL 5 MG/ML IJ SOLN
10.0000 mg | Freq: Once | INTRAMUSCULAR | Status: AC
  Administered 2018-07-16: 10 mg via INTRAMUSCULAR

## 2018-07-16 MED ORDER — METOCLOPRAMIDE HCL 5 MG/ML IJ SOLN
10.0000 mg | Freq: Once | INTRAMUSCULAR | Status: DC
  Filled 2018-07-16: qty 2

## 2018-07-16 MED ORDER — IBUPROFEN 600 MG PO TABS: 600.0000 mg | ORAL_TABLET | Freq: Four times a day (QID) | ORAL | 0 refills | Status: DC | PRN

## 2018-07-16 MED ORDER — IBUPROFEN 600 MG PO TABS
600.0000 mg | ORAL_TABLET | Freq: Once | ORAL | Status: AC
  Administered 2018-07-16: 600 mg via ORAL
  Filled 2018-07-16: qty 1

## 2018-07-16 NOTE — MAU Provider Note (Signed)
History     CSN: [id number]  Arrival date and time: 07/16/18 1152   First Provider Initiated Contact with Patient 07/16/18 1244      Chief Complaint  Patient presents with  . Abdominal Pain  . Vaginal Bleeding   HPI   [name] is a 27 y.o. female 173P0110 @ 6319w4d here with continued bleeding. She has a known large subchorionic hemorrhage. She has continued to have every day bleeding, however now the bleeding is very dark in color. She continues to have lower abdominal pain. She is not taking anything for the pain. The pain comes and goes. The pain occurs everyday.   OB History    Gravida  3   Para  1   Term  0   Preterm  1   AB  1   Living  0     SAB  0   TAB  1   Ectopic  0   Multiple  0   Live Births  0           Past Medical History:  Diagnosis Date  . Anxiety   . Depression   . Preterm labor     Past Surgical History:  Procedure Laterality Date  . DILATION AND CURETTAGE OF UTERUS    . LAPAROSCOPIC GASTRIC BAND REMOVAL WITH LAPAROSCOPIC GASTRIC SLEEVE RESECTION  08/2014    Family History  Problem Relation Age of Onset  . Hypertension Father   . Diabetes Paternal Grandmother     Social History   Tobacco Use  . Smoking status: Former Smoker    Last attempt to quit: 2018    Years since quitting: 1.9  . Smokeless tobacco: Never Used  Substance Use Topics  . Alcohol use: Not Currently    Comment: not in pregnancy  . Drug use: Not Currently    Types: Marijuana    Comment: last used 07-10-18    Allergies: No Known Allergies  Medications Prior to Admission  Medication Sig Dispense Refill Last Dose  . acetaminophen (TYLENOL) 500 MG tablet Take 500 mg by mouth every 6 (six) hours as needed for moderate pain.   11/07/2017 at Unknown time  . acetaminophen (TYLENOL) 650 MG suppository Place 1 suppository (650 mg total) rectally every 6 (six) hours as needed. 24 suppository 0   . famotidine (PEPCID) 20 MG tablet Take 1 tablet (20  mg total) by mouth 2 (two) times daily. 30 tablet 2   . labetalol (NORMODYNE) 200 MG tablet Take 1 tablet (200 mg total) by mouth 2 (two) times daily. (Patient not taking: Reported on 11/07/2017) 60 tablet 3 Not Taking at Unknown time  . metoCLOPramide (REGLAN) 10 MG tablet Take 1 tablet (10 mg total) by mouth every 6 (six) hours. 30 tablet 0   . nitrofurantoin, macrocrystal-monohydrate, (MACROBID) 100 MG capsule Take 1 capsule (100 mg total) by mouth 2 (two) times daily. 14 capsule 0   . ondansetron (ZOFRAN ODT) 4 MG disintegrating tablet Take 1 tablet (4 mg total) by mouth every 6 (six) hours as needed for nausea. (Patient not taking: Reported on 11/07/2017) 20 tablet 0 Not Taking at Unknown time  . pantoprazole (PROTONIX) 20 MG tablet Take 1 tablet (20 mg total) by mouth daily. 30 tablet 2 11/06/2017 at Unknown time  . phenazopyridine (PYRIDIUM) 200 MG tablet Take 1 tablet (200 mg total) by mouth 3 (three) times daily. 6 tablet 0   . Prenatal Vit-Fe Fumarate-FA (PREPLUS) 27-1 MG TABS Take 1 tablet by mouth  daily. 30 tablet 13   . promethazine (PHENERGAN) 25 MG suppository Place 1 suppository (25 mg total) rectally every 6 (six) hours as needed for nausea or vomiting. 20 each 0   . promethazine (PHENERGAN) 25 MG suppository Place 1 suppository (25 mg total) rectally every 6 (six) hours as needed for nausea or vomiting. 24 each 0   . promethazine (PHENERGAN) 25 MG tablet Take 1 tablet (25 mg total) by mouth every 6 (six) hours as needed for nausea or vomiting. 30 tablet 2 11/07/2017 at 0800   Results for orders placed or performed during the hospital encounter of 07/16/18 (from the past 48 hour(s))  Urinalysis, Routine w reflex microscopic     Status: Abnormal   Collection Time: 07/16/18 12:14 PM  Result Value Ref Range   Color, Urine AMBER (A) YELLOW    Comment: BIOCHEMICALS MAY BE AFFECTED BY COLOR   APPearance HAZY (A) CLEAR   Specific Gravity, Urine 1.030 1.005 - 1.030   pH 6.0 5.0 - 8.0    Glucose, UA NEGATIVE NEGATIVE mg/dL   Hgb urine dipstick MODERATE (A) NEGATIVE   Bilirubin Urine SMALL (A) NEGATIVE   Ketones, ur 80 (A) NEGATIVE mg/dL   Protein, ur >=161 (A) NEGATIVE mg/dL   Nitrite NEGATIVE NEGATIVE   Leukocytes, UA NEGATIVE NEGATIVE   RBC / HPF 6-10 0 - 5 RBC/hpf   WBC, UA 0-5 0 - 5 WBC/hpf   Bacteria, UA NONE SEEN NONE SEEN   Squamous Epithelial / LPF 0-5 0 - 5   Mucus PRESENT     Comment: Performed at [facility], [address], [place], [place] [address]   Review of Systems  Constitutional: Negative for fever.  Gastrointestinal: Positive for abdominal pain.  Genitourinary: Positive for vaginal bleeding.   Physical Exam   Blood pressure 126/68, pulse 80, temperature 98.4 F (36.9 C), temperature source Oral, resp. rate 16, height 5\' 7"  (1.702 m), weight 105.7 kg, last menstrual period 04/19/2018, SpO2 100 %, unknown if currently breastfeeding.  Physical Exam  Constitutional: She is oriented to person, place, and time. She appears well-developed and well-nourished. No distress.  HENT:  Head: Normocephalic.  GI: Soft. She exhibits no distension and no mass. There is no abdominal tenderness. There is no rebound and no guarding.  Genitourinary:    Genitourinary Comments: Cervix closed, thick, posterior. Small amount of dark brown blood noted exam glove.    Musculoskeletal: Normal range of motion.  Neurological: She is alert and oriented to person, place, and time.  Skin: Skin is warm. She is not diaphoretic.  Psychiatric: Her behavior is normal.   MAU Course  Procedures  None  MDM  O positive blood type + fetal heart tones via doppler  Patient with continued vaginal bleeding- moderate to large subchorionic hemorrhage on previous US. Discussed normalcy of bleeding and abdominal cramping with subchorionic hemorrhage. Ibuprofen given 600 mg PO; pain down from 10/10 to 7/10. Patient says she feels much better. Patient tolerating oral fluids in MAU.   IM reglan given prior to DC   Assessment and Plan   A:  1. Vaginal bleeding in pregnancy   2. Subchorionic hematoma in second trimester, single or unspecified fetus   3. Abdominal pain during pregnancy in second trimester   4. Nausea and vomiting in pregnancy     P:  Discharge home in stable condition Pelvic rest Rx: ibuprofen as needed #10 no refill Ok to use reglan vaginal/rectal Bleeding precautions Return to MAU if symptoms worsen. Discussed  process of subchorionic hemorrhage including risk of miscarriage.   Venia Carbonasch, Jennifer I, NP 07/16/2018 7:08 PM

## 2018-07-16 NOTE — MAU Note (Signed)
Pt reports bleeding for a few weeks, was not too bad until the last few days, pain lower abd for one month.

## 2018-07-16 NOTE — Discharge Instructions (Signed)
Subchorionic Hematoma ° °A subchorionic hematoma is a gathering of blood between the outer wall of the embryo (chorion) and the inner wall of the womb (uterus). °This condition can cause vaginal bleeding. If they cause little or no vaginal bleeding, early small hematomas usually shrink on their own and do not affect your baby or pregnancy. When bleeding starts later in pregnancy, or if the hematoma is larger or occurs in older pregnant women, the condition may be more serious. Larger hematomas may get bigger, which increases the chances of miscarriage. This condition also increases the risk of: °· Premature separation of the placenta from the uterus. °· Premature (preterm) labor. °· Stillbirth. °What are the causes? °The exact cause of this condition is not known. It occurs when blood is trapped between the placenta and the uterine wall because the placenta has separated from the original site of implantation. °What increases the risk? °You are more likely to develop this condition if: °· You were treated with fertility medicines. °· You conceived through in vitro fertilization (IVF). °What are the signs or symptoms? °Symptoms of this condition include: °· Vaginal spotting or bleeding. °· Contractions of the uterus. These cause abdominal pain. °Sometimes you may have no symptoms and the bleeding may only be seen when ultrasound images are taken (transvaginal ultrasound). °How is this diagnosed? °This condition is diagnosed based on a physical exam. This includes a pelvic exam. You may also have other tests, including: °· Blood tests. °· Urine tests. °· Ultrasound of the abdomen. °How is this treated? °Treatment for this condition can vary. Treatment may include: °· Watchful waiting. You will be monitored closely for any changes in bleeding. During this stage: °? The hematoma may be reabsorbed by the body. °? The hematoma may separate the fluid-filled space containing the embryo (gestational sac) from the wall of the  womb (endometrium). °· Medicines. °· Activity restriction. This may be needed until the bleeding stops. °Follow these instructions at home: °· Stay on bed rest if told to do so by your health care provider. °· Do not lift anything that is heavier than 10 lbs. (4.5 kg) or as told by your health care provider. °· Do not use any products that contain nicotine or tobacco, such as cigarettes and e-cigarettes. If you need help quitting, ask your health care provider. °· Track and write down the number of pads you use each day and how soaked (saturated) they are. °· Do not use tampons. °· Keep all follow-up visits as told by your health care provider. This is important. Your health care provider may ask you to have follow-up blood tests or ultrasound tests or both. °Contact a health care provider if: °· You have any vaginal bleeding. °· You have a fever. °Get help right away if: °· You have severe cramps in your stomach, back, abdomen, or pelvis. °· You pass large clots or tissue. Save any tissue for your health care provider to look at. °· You have more vaginal bleeding, and you faint or become lightheaded or weak. °Summary °· A subchorionic hematoma is a gathering of blood between the outer wall of the placenta and the uterus. °· This condition can cause vaginal bleeding. °· Sometimes you may have no symptoms and the bleeding may only be seen when ultrasound images are taken. °· Treatment may include watchful waiting, medicines, or activity restriction. °This information is not intended to replace advice given to you by your health care provider. Make sure you discuss any questions you   have with your health care provider. °Document Released: 10/19/2006 Document Revised: 08/30/2016 Document Reviewed: 08/30/2016 °Elsevier Interactive Patient Education © 2019 [employer] ° °

## 2018-07-18 NOTE — L&D Delivery Note (Signed)
Delivery Note Called to patient's room due to decel. Baby's head on perineum on exam.   At 12:11 PM a viable female was delivered via Vaginal, Spontaneous (Presentation: vertex; RPA).  APGAR: 9, 9; weight 4 lb 9 oz (2070 g).   Placenta status: spontaneous, intact.  Cord: 3vc with the following complications: none.  Cord pH: n/a  Anesthesia:  epidural Episiotomy: None Lacerations: Labial Suture Repair: 3.0 monocryl Est. Blood Loss (mL):  299  Mom to postpartum.  Baby to Couplet care / Skin to Skin.  Truett Mainland 01/15/2019, 12:56 PM

## 2018-07-27 ENCOUNTER — Encounter (HOSPITAL_COMMUNITY): Payer: Self-pay | Admitting: *Deleted

## 2018-07-27 ENCOUNTER — Inpatient Hospital Stay (HOSPITAL_COMMUNITY)
Admission: AD | Admit: 2018-07-27 | Discharge: 2018-08-02 | DRG: 832 | Disposition: A | Discharge status: Home / Self Care | Payer: Managed Care, Other (non HMO) | Attending: Family Medicine | Admitting: Family Medicine

## 2018-07-27 ENCOUNTER — Other Ambulatory Visit: Payer: Self-pay

## 2018-07-27 DIAGNOSIS — O99842 Bariatric surgery status complicating pregnancy, second trimester: Secondary | ICD-10-CM | POA: Diagnosis present

## 2018-07-27 DIAGNOSIS — R1032 Left lower quadrant pain: Secondary | ICD-10-CM | POA: Diagnosis present

## 2018-07-27 DIAGNOSIS — R4589 Other symptoms and signs involving emotional state: Secondary | ICD-10-CM

## 2018-07-27 DIAGNOSIS — O468X2 Other antepartum hemorrhage, second trimester: Secondary | ICD-10-CM

## 2018-07-27 DIAGNOSIS — O418X20 Other specified disorders of amniotic fluid and membranes, second trimester, not applicable or unspecified: Secondary | ICD-10-CM

## 2018-07-27 DIAGNOSIS — R634 Abnormal weight loss: Secondary | ICD-10-CM

## 2018-07-27 DIAGNOSIS — O211 Hyperemesis gravidarum with metabolic disturbance: Secondary | ICD-10-CM

## 2018-07-27 DIAGNOSIS — F12988 Cannabis use, unspecified with other cannabis-induced disorder: Secondary | ICD-10-CM

## 2018-07-27 DIAGNOSIS — O99342 Other mental disorders complicating pregnancy, second trimester: Secondary | ICD-10-CM | POA: Diagnosis present

## 2018-07-27 DIAGNOSIS — O210 Mild hyperemesis gravidarum: Secondary | ICD-10-CM | POA: Diagnosis not present

## 2018-07-27 DIAGNOSIS — O99322 Drug use complicating pregnancy, second trimester: Secondary | ICD-10-CM | POA: Diagnosis present

## 2018-07-27 DIAGNOSIS — F1290 Cannabis use, unspecified, uncomplicated: Secondary | ICD-10-CM | POA: Diagnosis present

## 2018-07-27 DIAGNOSIS — Z87891 Personal history of nicotine dependence: Secondary | ICD-10-CM

## 2018-07-27 DIAGNOSIS — O208 Other hemorrhage in early pregnancy: Secondary | ICD-10-CM | POA: Diagnosis present

## 2018-07-27 DIAGNOSIS — Z3A14 14 weeks gestation of pregnancy: Secondary | ICD-10-CM

## 2018-07-27 DIAGNOSIS — O4692 Antepartum hemorrhage, unspecified, second trimester: Secondary | ICD-10-CM

## 2018-07-27 DIAGNOSIS — F329 Major depressive disorder, single episode, unspecified: Secondary | ICD-10-CM | POA: Diagnosis present

## 2018-07-27 HISTORY — DX: Hyperemesis gravidarum with metabolic disturbance: O21.1

## 2018-07-27 HISTORY — DX: Unspecified asthma, uncomplicated: J45.909

## 2018-07-27 LAB — URINALYSIS, ROUTINE W REFLEX MICROSCOPIC
Bacteria, UA: NONE SEEN
Bilirubin Urine: NEGATIVE
Glucose, UA: NEGATIVE mg/dL
Ketones, ur: 80 mg/dL — AB
Leukocytes, UA: NEGATIVE
Nitrite: NEGATIVE
PH: 6 (ref 5.0–8.0)
Protein, ur: >=300 mg/dL — AB
Specific Gravity, Urine: 1.027 (ref 1.005–1.030)

## 2018-07-27 LAB — COMPREHENSIVE METABOLIC PANEL
ALT: 17 U/L (ref 0–44)
AST: 17 U/L (ref 15–41)
Albumin: 4.2 g/dL (ref 3.5–5.0)
Alkaline Phosphatase: 34 U/L — ABNORMAL LOW (ref 38–126)
Anion gap: 14 (ref 5–15)
BUN: 19 mg/dL (ref 6–20)
CO2: 18 mmol/L — ABNORMAL LOW (ref 22–32)
CREATININE: 0.79 mg/dL (ref 0.44–1.00)
Calcium: 9.3 mg/dL (ref 8.9–10.3)
Chloride: 101 mmol/L (ref 98–111)
GFR calc Af Amer: >60 mL/min (ref >60)
GFR calc non Af Amer: >60 mL/min (ref >60)
Glucose, Bld: 68 mg/dL — ABNORMAL LOW (ref 70–99)
Potassium: 3.3 mmol/L — ABNORMAL LOW (ref 3.5–5.1)
Sodium: 133 mmol/L — ABNORMAL LOW (ref 135–145)
TOTAL PROTEIN: 8.3 g/dL — AB (ref 6.5–8.1)
Total Bilirubin: 0.7 mg/dL (ref 0.3–1.2)

## 2018-07-27 MED ORDER — LACTATED RINGERS IV BOLUS
1000.0000 mL | Freq: Once | INTRAVENOUS | Status: AC
  Administered 2018-07-27: 1000 mL via INTRAVENOUS

## 2018-07-27 MED ORDER — FAMOTIDINE IN NACL 20-0.9 MG/50ML-% IV SOLN
20.0000 mg | Freq: Once | INTRAVENOUS | Status: AC
  Administered 2018-07-27: 20 mg via INTRAVENOUS
  Filled 2018-07-27: qty 50

## 2018-07-27 MED ORDER — ACETAMINOPHEN 325 MG PO TABS
650.0000 mg | ORAL_TABLET | ORAL | Status: DC | PRN
  Administered 2018-07-27 – 2018-07-30 (×2): 650 mg via ORAL
  Filled 2018-07-27 (×4): qty 2

## 2018-07-27 MED ORDER — HALOPERIDOL LACTATE 5 MG/ML IJ SOLN
2.0000 mg | Freq: Four times a day (QID) | INTRAMUSCULAR | Status: DC | PRN
  Administered 2018-07-27: 2 mg via INTRAVENOUS
  Filled 2018-07-27 (×2): qty 0.4

## 2018-07-27 MED ORDER — CALCIUM CARBONATE ANTACID 500 MG PO CHEW: 2.0000 | CHEWABLE_TABLET | ORAL | Status: DC | PRN

## 2018-07-27 MED ORDER — DOCUSATE SODIUM 100 MG PO CAPS
100.0000 mg | ORAL_CAPSULE | Freq: Every day | ORAL | Status: DC
  Administered 2018-07-27 – 2018-08-02 (×4): 100 mg via ORAL
  Filled 2018-07-27 (×9): qty 1

## 2018-07-27 MED ORDER — KETOROLAC TROMETHAMINE 15 MG/ML IJ SOLN
15.0000 mg | Freq: Once | INTRAMUSCULAR | Status: AC
  Administered 2018-07-27: 15 mg via INTRAMUSCULAR
  Filled 2018-07-27: qty 1

## 2018-07-27 MED ORDER — SCOPOLAMINE 1 MG/3DAYS TD PT72
1.0000 | MEDICATED_PATCH | TRANSDERMAL | Status: DC
  Administered 2018-07-27 – 2018-08-02 (×3): 1.5 mg via TRANSDERMAL
  Filled 2018-07-27 (×3): qty 1

## 2018-07-27 MED ORDER — ONDANSETRON HCL 4 MG/2ML IJ SOLN
4.0000 mg | Freq: Four times a day (QID) | INTRAMUSCULAR | Status: DC
  Administered 2018-07-27 – 2018-07-28 (×5): 4 mg via INTRAVENOUS
  Filled 2018-07-27 (×6): qty 2

## 2018-07-27 MED ORDER — ZOLPIDEM TARTRATE 5 MG PO TABS
5.0000 mg | ORAL_TABLET | Freq: Every evening | ORAL | Status: DC | PRN
  Administered 2018-07-30 – 2018-08-01 (×3): 5 mg via ORAL
  Filled 2018-07-27 (×4): qty 1

## 2018-07-27 MED ORDER — POTASSIUM CHLORIDE IN NACL 40-0.9 MEQ/L-% IV SOLN
INTRAVENOUS | Status: DC
  Filled 2018-07-27: qty 1000

## 2018-07-27 MED ORDER — FAMOTIDINE IN NACL 20-0.9 MG/50ML-% IV SOLN: 20.0000 mg | Freq: Once | INTRAVENOUS | Status: DC

## 2018-07-27 MED ORDER — PROMETHAZINE HCL 25 MG/ML IJ SOLN
12.5000 mg | Freq: Four times a day (QID) | INTRAMUSCULAR | Status: DC
  Administered 2018-07-27 – 2018-08-02 (×24): 12.5 mg via INTRAVENOUS
  Filled 2018-07-27 (×24): qty 1

## 2018-07-27 MED ORDER — FENTANYL CITRATE (PF) 100 MCG/2ML IJ SOLN
50.0000 ug | Freq: Once | INTRAMUSCULAR | Status: AC
  Administered 2018-07-27: 50 ug via INTRAVENOUS
  Filled 2018-07-27: qty 2

## 2018-07-27 MED ORDER — INFLUENZA VAC SPLIT QUAD 0.5 ML IM SUSY: 0.5000 mL | PREFILLED_SYRINGE | INTRAMUSCULAR | Status: DC

## 2018-07-27 MED ORDER — PRENATAL MULTIVITAMIN CH
1.0000 | ORAL_TABLET | Freq: Every day | ORAL | Status: DC
  Administered 2018-07-29 – 2018-08-02 (×3): 1 via ORAL
  Filled 2018-07-27 (×6): qty 1

## 2018-07-27 MED ORDER — DEXTROSE 5 % IN LACTATED RINGERS IV BOLUS: 1000.0000 mL | Freq: Once | INTRAVENOUS | Status: DC

## 2018-07-27 MED ORDER — SODIUM CHLORIDE 0.9 % IV SOLN
INTRAVENOUS | Status: DC
  Administered 2018-07-27 – 2018-08-02 (×18): via INTRAVENOUS
  Filled 2018-07-27 (×26): qty 1000

## 2018-07-27 MED ORDER — SODIUM CHLORIDE 0.9 % IV SOLN
8.0000 mg | Freq: Once | INTRAVENOUS | Status: AC
  Administered 2018-07-27: 8 mg via INTRAVENOUS
  Filled 2018-07-27: qty 4

## 2018-07-27 NOTE — MAU Provider Note (Signed)
History     CSN: [id number]  Arrival date and time: 07/27/18 1113   First Provider Initiated Contact with Patient 07/27/18 1254      Chief Complaint  Patient presents with  . Emesis  . Fatigue  . Abdominal Pain  . Vaginal Bleeding   HPI   Tammy Andrews is a 28 y.o. female G3P0110 with a history of gastric sleeve surgery, and known large subchorionic hemorrhage  @ [redacted]w[redacted]d here with continued nausea and vomiting, abdominal pain, and vaginal bleeding. Says she has lost 30 lbs + since pregnancy. Says she has not had anything to eat in the past 2-3 weeks. She had a 6 month lost with previous pregnancy and feels depressed during this pregnancy.  Says she smokes pot 4-5 times per day and then sits in the shower for a few hours which helps some.  Taking phenergan 1x per day which does not help.  Husband is present today and is concerned that she is depressed. He says with her 6 month loss previously that this pregnancy has brought on a lot of the same emotions she had when losing the baby. He says that she has not eaten a single meal in 2-3 weeks.   Pre pregnancy weight 254  OB History    Gravida  3   Para  1   Term  0   Preterm  1   AB  1   Living  0     SAB  0   TAB  1   Ectopic  0   Multiple  0   Live Births  0           Past Medical History:  Diagnosis Date  . Anxiety   . Asthma    childhood  . Depression   . Preterm labor     Past Surgical History:  Procedure Laterality Date  . DILATION AND CURETTAGE OF UTERUS    . LAPAROSCOPIC GASTRIC BAND REMOVAL WITH LAPAROSCOPIC GASTRIC SLEEVE RESECTION  08/2014    Family History  Problem Relation Age of Onset  . Hypertension Father   . Diabetes Paternal Grandmother     Social History   Tobacco Use  . Smoking status: Former Smoker    Last attempt to quit: 2018    Years since quitting: 2.0  . Smokeless tobacco: Never Used  Substance Use Topics  . Alcohol use: Not Currently    Comment: not in  pregnancy  . Drug use: Not Currently    Types: Marijuana    Comment: last used 07-10-18    Allergies: No Known Allergies  Medications Prior to Admission  Medication Sig Dispense Refill Last Dose  . acetaminophen (TYLENOL) 500 MG tablet Take 500 mg by mouth every 6 (six) hours as needed for moderate pain.   11/07/2017 at Unknown time  . acetaminophen (TYLENOL) 650 MG suppository Place 1 suppository (650 mg total) rectally every 6 (six) hours as needed. 24 suppository 0   . famotidine (PEPCID) 20 MG tablet Take 1 tablet (20 mg total) by mouth 2 (two) times daily. 30 tablet 2   . ibuprofen (ADVIL,MOTRIN) 600 MG tablet Take 1 tablet (600 mg total) by mouth every 6 (six) hours as needed. 15 tablet 0   . labetalol (NORMODYNE) 200 MG tablet Take 1 tablet (200 mg total) by mouth 2 (two) times daily. (Patient not taking: Reported on 11/07/2017) 60 tablet 3 Not Taking at Unknown time  . metoCLOPramide (REGLAN) 10 MG tablet Take 1 tablet (10  mg total) by mouth every 6 (six) hours. 30 tablet 0   . nitrofurantoin, macrocrystal-monohydrate, (MACROBID) 100 MG capsule Take 1 capsule (100 mg total) by mouth 2 (two) times daily. 14 capsule 0   . ondansetron (ZOFRAN ODT) 4 MG disintegrating tablet Take 1 tablet (4 mg total) by mouth every 6 (six) hours as needed for nausea. (Patient not taking: Reported on 11/07/2017) 20 tablet 0 Not Taking at Unknown time  . pantoprazole (PROTONIX) 20 MG tablet Take 1 tablet (20 mg total) by mouth daily. 30 tablet 2 11/06/2017 at Unknown time  . phenazopyridine (PYRIDIUM) 200 MG tablet Take 1 tablet (200 mg total) by mouth 3 (three) times daily. 6 tablet 0   . Prenatal Vit-Fe Fumarate-FA (PREPLUS) 27-1 MG TABS Take 1 tablet by mouth daily. 30 tablet 13   . promethazine (PHENERGAN) 25 MG suppository Place 1 suppository (25 mg total) rectally every 6 (six) hours as needed for nausea or vomiting. 20 each 0    Results for orders placed or performed during the hospital encounter of  07/27/18 (from the past 48 hour(s))  Urinalysis, Routine w reflex microscopic     Status: Abnormal   Collection Time: 07/27/18 11:38 AM  Result Value Ref Range   Color, Urine AMBER (A) YELLOW    Comment: BIOCHEMICALS MAY BE AFFECTED BY COLOR   APPearance HAZY (A) CLEAR   Specific Gravity, Urine 1.027 1.005 - 1.030   pH 6.0 5.0 - 8.0   Glucose, UA NEGATIVE NEGATIVE mg/dL   Hgb urine dipstick MODERATE (A) NEGATIVE   Bilirubin Urine NEGATIVE NEGATIVE   Ketones, ur 80 (A) NEGATIVE mg/dL   Protein, ur >=161 (A) NEGATIVE mg/dL   Nitrite NEGATIVE NEGATIVE   Leukocytes, UA NEGATIVE NEGATIVE   RBC / HPF 6-10 0 - 5 RBC/hpf   WBC, UA 6-10 0 - 5 WBC/hpf   Bacteria, UA NONE SEEN NONE SEEN   Squamous Epithelial / LPF 0-5 0 - 5   Mucus PRESENT     Comment: Performed at [facility], [address] [place], [place] [address]  Comprehensive metabolic panel     Status: Abnormal   Collection Time: 07/27/18  1:42 PM  Result Value Ref Range   Sodium 133 (L) 135 - 145 mmol/L   Potassium 3.3 (L) 3.5 - 5.1 mmol/L   Chloride 101 98 - 111 mmol/L   CO2 18 (L) 22 - 32 mmol/L   Glucose, Bld 68 (L) 70 - 99 mg/dL   BUN 19 6 - 20 mg/dL   Creatinine, Ser 5.40 0.44 - 1.00 mg/dL   Calcium 9.3 8.9 - 98.1 mg/dL   Total Protein 8.3 (H) 6.5 - 8.1 g/dL   Albumin 4.2 3.5 - 5.0 g/dL   AST 17 15 - 41 U/L   ALT 17 0 - 44 U/L   Alkaline Phosphatase 34 (L) 38 - 126 U/L   Total Bilirubin 0.7 0.3 - 1.2 mg/dL   GFR calc non Af Amer >60 >60 mL/min   GFR calc Af Amer >60 >60 mL/min   Anion gap 14 5 - 15    Comment: Performed at [facility], [address], [place], [place] [address]   Review of Systems  Constitutional: Positive for activity change, appetite change and fatigue. Negative for fever.  Gastrointestinal: Positive for abdominal pain, anal bleeding, nausea and vomiting. Negative for diarrhea.  Genitourinary: Positive for vaginal bleeding and vaginal discharge. Negative for dysuria.  Neurological:  Positive for weakness.   Physical Exam  Blood pressure 123/74, pulse 88, temperature 97.6 F (36.4 C), temperature source Oral, resp. rate 18, weight 98.9 kg, last menstrual period 04/19/2018, SpO2 100 %, unknown if currently breastfeeding.  Physical Exam  Constitutional: She is oriented to person, place, and time. She appears well-developed and well-nourished.  Non-toxic appearance. She has a sickly appearance. She appears ill. She appears distressed.  HENT:  Head: Normocephalic.  GI: Soft. Normal appearance. There is abdominal tenderness in the suprapubic area. There is no rigidity, no rebound and no guarding.  Genitourinary:    Genitourinary Comments: Vagina - Small amount of brown/pink vaginal discharge, no odor  Cervix - No contact bleeding, no active bleeding  Bimanual exam: Cervix closed, posterior  Chaperone present for exam.    Musculoskeletal: Normal range of motion.  Neurological: She is alert and oriented to person, place, and time.  Skin: Skin is warm.  Psychiatric: Her affect is labile. Her affect is not inappropriate. She is withdrawn. She exhibits a depressed mood.   MAU Course  Procedures  None  MDM  O positive blood type  30+ weight loss in pregnancy LR bolus & D5LR bolus Zofran 8 mg IV Pepcid 20 Haldol 2 mg IV and Toradol 60 mg IM CBC & CMP Discussed admission with Dr. Shawnie PonsPratt.  Assessment and Plan   A:   1. Cannabinoid hyperemesis syndrome (HCC)   2. Rapid weight loss   3. Depressed mood     P:  Admit to [facility] pysch consult, dietician, and home health.   Duane Lopeasch,  I, NP 07/27/2018 2:35 PM

## 2018-07-27 NOTE — MAU Note (Addendum)
Pt C/O vomiting, very weak, having trouble getting out of bed.  C/O L side/rib pain, also down into LLQ x 1 1/2 weeks.  States she has been bleeding x 1 month, yesterday changed 3 pads yesterday - fully saturated.  Last took phenergan two days ago, states it isn't helping.

## 2018-07-27 NOTE — H&P (Signed)
History     CSN: [id number]  Arrival date and time: 07/27/18 1113   First Provider Initiated Contact with Patient 07/27/18 1254      Chief Complaint  Patient presents with  . Emesis  . Fatigue  . Abdominal Pain  . Vaginal Bleeding   HPI   [name] is a 27 y.o. female G3P0110 with a history of gastric sleeve surgery, and known large subchorionic hemorrhage  @ [redacted]w[redacted]d here with continued nausea and vomiting, abdominal pain, and vaginal bleeding. Says she has lost 30 lbs + since pregnancy. Says she has not had anything to eat in the past 2-3 weeks. She had a 6 month lost with previous pregnancy and feels depressed during this pregnancy.  Says she smokes pot 4-5 times per day and then sits in the shower for a few hours which helps some.  Taking phenergan 1x per day which does not help.  Husband is present today and is concerned that she is depressed. He says with her 6 month loss previously that this pregnancy has brought on a lot of the same emotions she had when losing the baby. He says that she has not eaten a single meal in 2-3 weeks.   Pre pregnancy weight 254  OB History    Gravida  3   Para  1   Term  0   Preterm  1   AB  1   Living  0     SAB  0   TAB  1   Ectopic  0   Multiple  0   Live Births  0           Past Medical History:  Diagnosis Date  . Anxiety   . Asthma    childhood  . Depression   . Preterm labor     Past Surgical History:  Procedure Laterality Date  . DILATION AND CURETTAGE OF UTERUS    . LAPAROSCOPIC GASTRIC BAND REMOVAL WITH LAPAROSCOPIC GASTRIC SLEEVE RESECTION  08/2014    Family History  Problem Relation Age of Onset  . Hypertension Father   . Diabetes Paternal Grandmother     Social History   Tobacco Use  . Smoking status: Former Smoker    Last attempt to quit: 2018    Years since quitting: 2.0  . Smokeless tobacco: Never Used  Substance Use Topics  . Alcohol use: Not Currently    Comment: not in  pregnancy  . Drug use: Not Currently    Types: Marijuana    Comment: last used 07-10-18    Allergies: No Known Allergies  Medications Prior to Admission  Medication Sig Dispense Refill Last Dose  . acetaminophen (TYLENOL) 500 MG tablet Take 500 mg by mouth every 6 (six) hours as needed for moderate pain.   11/07/2017 at Unknown time  . acetaminophen (TYLENOL) 650 MG suppository Place 1 suppository (650 mg total) rectally every 6 (six) hours as needed. 24 suppository 0   . famotidine (PEPCID) 20 MG tablet Take 1 tablet (20 mg total) by mouth 2 (two) times daily. 30 tablet 2   . ibuprofen (ADVIL,MOTRIN) 600 MG tablet Take 1 tablet (600 mg total) by mouth every 6 (six) hours as needed. 15 tablet 0   . labetalol (NORMODYNE) 200 MG tablet Take 1 tablet (200 mg total) by mouth 2 (two) times daily. (Patient not taking: Reported on 11/07/2017) 60 tablet 3 Not Taking at Unknown time  . metoCLOPramide (REGLAN) 10 MG tablet Take 1 tablet (10   mg total) by mouth every 6 (six) hours. 30 tablet 0   . nitrofurantoin, macrocrystal-monohydrate, (MACROBID) 100 MG capsule Take 1 capsule (100 mg total) by mouth 2 (two) times daily. 14 capsule 0   . ondansetron (ZOFRAN ODT) 4 MG disintegrating tablet Take 1 tablet (4 mg total) by mouth every 6 (six) hours as needed for nausea. (Patient not taking: Reported on 11/07/2017) 20 tablet 0 Not Taking at Unknown time  . pantoprazole (PROTONIX) 20 MG tablet Take 1 tablet (20 mg total) by mouth daily. 30 tablet 2 11/06/2017 at Unknown time  . phenazopyridine (PYRIDIUM) 200 MG tablet Take 1 tablet (200 mg total) by mouth 3 (three) times daily. 6 tablet 0   . Prenatal Vit-Fe Fumarate-FA (PREPLUS) 27-1 MG TABS Take 1 tablet by mouth daily. 30 tablet 13   . promethazine (PHENERGAN) 25 MG suppository Place 1 suppository (25 mg total) rectally every 6 (six) hours as needed for nausea or vomiting. 20 each 0    Results for orders placed or performed during the hospital encounter of  07/27/18 (from the past 48 hour(s))  Urinalysis, Routine w reflex microscopic     Status: Abnormal   Collection Time: 07/27/18 11:38 AM  Result Value Ref Range   Color, Urine AMBER (A) YELLOW    Comment: BIOCHEMICALS MAY BE AFFECTED BY COLOR   APPearance HAZY (A) CLEAR   Specific Gravity, Urine 1.027 1.005 - 1.030   pH 6.0 5.0 - 8.0   Glucose, UA NEGATIVE NEGATIVE mg/dL   Hgb urine dipstick MODERATE (A) NEGATIVE   Bilirubin Urine NEGATIVE NEGATIVE   Ketones, ur 80 (A) NEGATIVE mg/dL   Protein, ur >=300 (A) NEGATIVE mg/dL   Nitrite NEGATIVE NEGATIVE   Leukocytes, UA NEGATIVE NEGATIVE   RBC / HPF 6-10 0 - 5 RBC/hpf   WBC, UA 6-10 0 - 5 WBC/hpf   Bacteria, UA NONE SEEN NONE SEEN   Squamous Epithelial / LPF 0-5 0 - 5   Mucus PRESENT     Comment: Performed at [facility], [address] [place], [place] [address]  Comprehensive metabolic panel     Status: Abnormal   Collection Time: 07/27/18  1:42 PM  Result Value Ref Range   Sodium 133 (L) 135 - 145 mmol/L   Potassium 3.3 (L) 3.5 - 5.1 mmol/L   Chloride 101 98 - 111 mmol/L   CO2 18 (L) 22 - 32 mmol/L   Glucose, Bld 68 (L) 70 - 99 mg/dL   BUN 19 6 - 20 mg/dL   Creatinine, Ser 0.79 0.44 - 1.00 mg/dL   Calcium 9.3 8.9 - 10.3 mg/dL   Total Protein 8.3 (H) 6.5 - 8.1 g/dL   Albumin 4.2 3.5 - 5.0 g/dL   AST 17 15 - 41 U/L   ALT 17 0 - 44 U/L   Alkaline Phosphatase 34 (L) 38 - 126 U/L   Total Bilirubin 0.7 0.3 - 1.2 mg/dL   GFR calc non Af Amer >60 >60 mL/min   GFR calc Af Amer >60 >60 mL/min   Anion gap 14 5 - 15    Comment: Performed at [facility], [address] [place], [place] [address]   Review of Systems  Constitutional: Positive for activity change, appetite change and fatigue. Negative for fever.  Gastrointestinal: Positive for abdominal pain, anal bleeding, nausea and vomiting. Negative for diarrhea.  Genitourinary: Positive for vaginal bleeding and vaginal discharge. Negative for dysuria.  Neurological:  Positive for weakness.   Physical Exam     Blood pressure 123/74, pulse 88, temperature 97.6 F (36.4 C), temperature source Oral, resp. rate 18, weight 98.9 kg, last menstrual period 04/19/2018, SpO2 100 %, unknown if currently breastfeeding.  Physical Exam  Constitutional: She is oriented to person, place, and time. She appears well-developed and well-nourished.  Non-toxic appearance. She has a sickly appearance. She appears ill. She appears distressed.  HENT:  Head: Normocephalic.  GI: Soft. Normal appearance. There is abdominal tenderness in the suprapubic area. There is no rigidity, no rebound and no guarding.  Genitourinary:    Genitourinary Comments: Vagina - Small amount of brown/pink vaginal discharge, no odor  Cervix - No contact bleeding, no active bleeding  Bimanual exam: Cervix closed, posterior  Chaperone present for exam.    Musculoskeletal: Normal range of motion.  Neurological: She is alert and oriented to person, place, and time.  Skin: Skin is warm.  Psychiatric: Her affect is labile. Her affect is not inappropriate. She is withdrawn. She exhibits a depressed mood.   MAU Course  Procedures  None  MDM  O positive blood type  30+ weight loss in pregnancy LR bolus & D5LR bolus Zofran 8 mg IV Pepcid 20 Haldol 2 mg IV and Toradol 60 mg IM CBC & CMP Discussed admission with Dr. Pratt.  Assessment and Plan   A:   1. Cannabinoid hyperemesis syndrome (HCC)   2. Rapid weight loss   3. Depressed mood     P:  Admit to [facility] p[facility]ch consult, dietician, and home health.   Ola Raap I, NP 07/27/2018 2:35 PM  

## 2018-07-28 DIAGNOSIS — O4692 Antepartum hemorrhage, unspecified, second trimester: Secondary | ICD-10-CM

## 2018-07-28 DIAGNOSIS — O99322 Drug use complicating pregnancy, second trimester: Secondary | ICD-10-CM | POA: Diagnosis present

## 2018-07-28 DIAGNOSIS — O210 Mild hyperemesis gravidarum: Secondary | ICD-10-CM | POA: Diagnosis present

## 2018-07-28 DIAGNOSIS — O468X2 Other antepartum hemorrhage, second trimester: Secondary | ICD-10-CM | POA: Diagnosis not present

## 2018-07-28 DIAGNOSIS — O99842 Bariatric surgery status complicating pregnancy, second trimester: Secondary | ICD-10-CM | POA: Diagnosis present

## 2018-07-28 DIAGNOSIS — O418X20 Other specified disorders of amniotic fluid and membranes, second trimester, not applicable or unspecified: Secondary | ICD-10-CM | POA: Diagnosis not present

## 2018-07-28 DIAGNOSIS — Z3A14 14 weeks gestation of pregnancy: Secondary | ICD-10-CM | POA: Diagnosis not present

## 2018-07-28 DIAGNOSIS — F329 Major depressive disorder, single episode, unspecified: Secondary | ICD-10-CM | POA: Diagnosis present

## 2018-07-28 DIAGNOSIS — R1032 Left lower quadrant pain: Secondary | ICD-10-CM | POA: Diagnosis present

## 2018-07-28 DIAGNOSIS — O99342 Other mental disorders complicating pregnancy, second trimester: Secondary | ICD-10-CM | POA: Diagnosis present

## 2018-07-28 DIAGNOSIS — O10012 Pre-existing essential hypertension complicating pregnancy, second trimester: Secondary | ICD-10-CM | POA: Diagnosis not present

## 2018-07-28 DIAGNOSIS — Z87891 Personal history of nicotine dependence: Secondary | ICD-10-CM | POA: Diagnosis not present

## 2018-07-28 DIAGNOSIS — O99212 Obesity complicating pregnancy, second trimester: Secondary | ICD-10-CM | POA: Diagnosis not present

## 2018-07-28 DIAGNOSIS — O211 Hyperemesis gravidarum with metabolic disturbance: Secondary | ICD-10-CM | POA: Diagnosis present

## 2018-07-28 DIAGNOSIS — F1290 Cannabis use, unspecified, uncomplicated: Secondary | ICD-10-CM | POA: Diagnosis present

## 2018-07-28 DIAGNOSIS — O208 Other hemorrhage in early pregnancy: Secondary | ICD-10-CM | POA: Diagnosis present

## 2018-07-28 LAB — BASIC METABOLIC PANEL
Anion gap: 6 (ref 5–15)
BUN: 13 mg/dL (ref 6–20)
CO2: 20 mmol/L — ABNORMAL LOW (ref 22–32)
Calcium: 8.4 mg/dL — ABNORMAL LOW (ref 8.9–10.3)
Chloride: 106 mmol/L (ref 98–111)
Creatinine, Ser: 0.67 mg/dL (ref 0.44–1.00)
GFR calc Af Amer: >60 mL/min (ref >60)
GFR calc non Af Amer: >60 mL/min (ref >60)
Glucose, Bld: 83 mg/dL (ref 70–99)
Potassium: 3.4 mmol/L — ABNORMAL LOW (ref 3.5–5.1)
Sodium: 132 mmol/L — ABNORMAL LOW (ref 135–145)

## 2018-07-28 MED ORDER — FENTANYL CITRATE (PF) 100 MCG/2ML IJ SOLN
50.0000 ug | Freq: Once | INTRAMUSCULAR | Status: AC
  Administered 2018-07-28: 50 ug via INTRAVENOUS
  Filled 2018-07-28: qty 2

## 2018-07-28 MED ORDER — SODIUM CHLORIDE 0.9 % IV SOLN
8.0000 mg | Freq: Three times a day (TID) | INTRAVENOUS | Status: DC
  Filled 2018-07-28: qty 4

## 2018-07-28 MED ORDER — CYCLOBENZAPRINE HCL 10 MG PO TABS
10.0000 mg | ORAL_TABLET | Freq: Three times a day (TID) | ORAL | Status: DC
  Administered 2018-07-28 – 2018-08-02 (×15): 10 mg via ORAL
  Filled 2018-07-28 (×19): qty 1

## 2018-07-28 MED ORDER — KETOROLAC TROMETHAMINE 30 MG/ML IJ SOLN: 60.0000 mg | Freq: Three times a day (TID) | INTRAMUSCULAR | Status: DC

## 2018-07-28 MED ORDER — DIPHENHYDRAMINE HCL 25 MG PO CAPS
25.0000 mg | ORAL_CAPSULE | Freq: Four times a day (QID) | ORAL | Status: DC
  Administered 2018-07-28 – 2018-08-02 (×19): 25 mg via ORAL
  Filled 2018-07-28 (×20): qty 1

## 2018-07-28 MED ORDER — ESCITALOPRAM OXALATE 20 MG PO TABS
20.0000 mg | ORAL_TABLET | Freq: Every day | ORAL | Status: DC
  Administered 2018-07-28 – 2018-08-02 (×5): 20 mg via ORAL
  Filled 2018-07-28 (×7): qty 1

## 2018-07-28 MED ORDER — AMITRIPTYLINE HCL 50 MG PO TABS
50.0000 mg | ORAL_TABLET | Freq: Every day | ORAL | Status: DC
  Administered 2018-07-30 – 2018-08-02 (×4): 50 mg via ORAL
  Filled 2018-07-28 (×8): qty 1

## 2018-07-28 MED ORDER — KETOROLAC TROMETHAMINE 30 MG/ML IJ SOLN: 30.0000 mg | Freq: Three times a day (TID) | INTRAMUSCULAR | Status: DC

## 2018-07-28 MED ORDER — SODIUM CHLORIDE 0.9 % IV SOLN
8.0000 mg | Freq: Three times a day (TID) | INTRAVENOUS | Status: DC
  Administered 2018-07-28 – 2018-08-02 (×15): 8 mg via INTRAVENOUS
  Filled 2018-07-28 (×17): qty 4

## 2018-07-28 MED ORDER — KETOROLAC TROMETHAMINE 30 MG/ML IJ SOLN
30.0000 mg | Freq: Three times a day (TID) | INTRAMUSCULAR | Status: AC
  Administered 2018-07-28 – 2018-07-31 (×9): 30 mg via INTRAVENOUS
  Filled 2018-07-28 (×9): qty 1

## 2018-07-28 MED ORDER — KETOROLAC TROMETHAMINE 30 MG/ML IJ SOLN
30.0000 mg | Freq: Once | INTRAMUSCULAR | Status: AC
  Administered 2018-07-28: 30 mg via INTRAVENOUS
  Filled 2018-07-28: qty 1

## 2018-07-28 NOTE — Progress Notes (Signed)
Patient ID: Tammy BareChynna Labarre, female   DOB: [birth year], 28 y.o.   MRN: [id number] FACULTY PRACTICE ANTEPARTUM(COMPREHENSIVE) NOTE  Tammy Andrews is a 28 y.o. G3P0110 with Estimated Date of Delivery: 01/24/19   By  early ultrasound 749w2d  who is admitted for dehydration due to cyclical N/V from cannabis and HG, LLQ pain.    Fetal presentation is unsure. Length of Stay:  0  Days  Date of admission:07/27/2018  Subjective:  Patient reports the fetal movement as n/a. Patient reports uterine contraction  activity as none. Patient reports  vaginal bleeding as none. Patient describes fluid per vagina as None.  Vitals:  Blood pressure 137/89, pulse 82, temperature 99 F (37.2 C), temperature source Oral, resp. rate 16, height 5\' 7"  (1.702 m), weight 105.5 kg, last menstrual period 04/19/2018, SpO2 100 %, unknown if currently breastfeeding. Vitals:   07/27/18 1600 07/27/18 1935 07/27/18 2343 07/28/18 0553  BP:  131/74 119/69 137/89  Pulse:  80 78 82  Resp:  16 16 16   Temp:  98.2 F (36.8 C) 98.5 F (36.9 C) 99 F (37.2 C)  TempSrc:  Oral  Oral  SpO2:  100% 100% 100%  Weight: 98.9 kg   105.5 kg  Height: 5\' 7"  (1.702 m)      Physical Examination:  General appearance - alert, well appearing, and in no distress Abdomen - soft non tender tender mild LLQ   Fetal Monitoring:  +Doppler     Labs:  Results for orders placed or performed during the hospital encounter of 07/27/18 (from the past 24 hour(s))  Urinalysis, Routine w reflex microscopic   Collection Time: 07/27/18 11:38 AM  Result Value Ref Range   Color, Urine AMBER (A) YELLOW   APPearance HAZY (A) CLEAR   Specific Gravity, Urine 1.027 1.005 - 1.030   pH 6.0 5.0 - 8.0   Glucose, UA NEGATIVE NEGATIVE mg/dL   Hgb urine dipstick MODERATE (A) NEGATIVE   Bilirubin Urine NEGATIVE NEGATIVE   Ketones, ur 80 (A) NEGATIVE mg/dL   Protein, ur >=409>=300 (A) NEGATIVE mg/dL   Nitrite NEGATIVE NEGATIVE   Leukocytes, UA NEGATIVE NEGATIVE   RBC /  HPF 6-10 0 - 5 RBC/hpf   WBC, UA 6-10 0 - 5 WBC/hpf   Bacteria, UA NONE SEEN NONE SEEN   Squamous Epithelial / LPF 0-5 0 - 5   Mucus PRESENT   Comprehensive metabolic panel   Collection Time: 07/27/18  1:42 PM  Result Value Ref Range   Sodium 133 (L) 135 - 145 mmol/L   Potassium 3.3 (L) 3.5 - 5.1 mmol/L   Chloride 101 98 - 111 mmol/L   CO2 18 (L) 22 - 32 mmol/L   Glucose, Bld 68 (L) 70 - 99 mg/dL   BUN 19 6 - 20 mg/dL   Creatinine, Ser 8.110.79 0.44 - 1.00 mg/dL   Calcium 9.3 8.9 - 91.410.3 mg/dL   Total Protein 8.3 (H) 6.5 - 8.1 g/dL   Albumin 4.2 3.5 - 5.0 g/dL   AST 17 15 - 41 U/L   ALT 17 0 - 44 U/L   Alkaline Phosphatase 34 (L) 38 - 126 U/L   Total Bilirubin 0.7 0.3 - 1.2 mg/dL   GFR calc non Af Amer >60 >60 mL/min   GFR calc Af Amer >60 >60 mL/min   Anion gap 14 5 - 15  Basic metabolic panel   Collection Time: 07/28/18  5:47 AM  Result Value Ref Range   Sodium 132 (L) 135 - 145 mmol/L  Potassium 3.4 (L) 3.5 - 5.1 mmol/L   Chloride 106 98 - 111 mmol/L   CO2 20 (L) 22 - 32 mmol/L   Glucose, Bld 83 70 - 99 mg/dL   BUN 13 6 - 20 mg/dL   Creatinine, Ser 1.61 0.44 - 1.00 mg/dL   Calcium 8.4 (L) 8.9 - 10.3 mg/dL   GFR calc non Af Amer >60 >60 mL/min   GFR calc Af Amer >60 >60 mL/min   Anion gap 6 5 - 15    Imaging Studies:      Medications:  Scheduled . docusate sodium  100 mg Oral Daily  . escitalopram  20 mg Oral Daily  . Influenza vac split quadrivalent PF  0.5 mL Intramuscular Tomorrow-1000  . ondansetron (ZOFRAN) IV  4 mg Intravenous Q6H  . prenatal multivitamin  1 tablet Oral Q1200  . promethazine  12.5 mg Intravenous Q6H  . scopolamine  1 patch Transdermal Q72H   I have reviewed the patient's current medications.  ASSESSMENT: [id number] [redacted]w[redacted]d Estimated Date of Delivery: 01/24/19  Cyclical nausea vomiting due to cannabis Depression   Patient Active Problem List   Diagnosis Date Noted  . Hyperemesis affecting pregnancy, antepartum 07/27/2018  . Substance abuse  (HCC) 06/22/2018    PLAN: Hydration and nausea control will not advance diet this am Begin lexapro 20 mg due to obvious depression Encouraged to discontinue her cannabis abuse as a primary cause for her N?V and contributing to her  depression  Lazaro Arms 07/28/2018,7:24 AM

## 2018-07-28 NOTE — Progress Notes (Signed)
Pt c/o pain 8/10 to left lower Abd/groin area that has been present for prior 2 weeks.  Called Resident (Dr. Dareen Piano) who asked that I call Dr. Despina Hidden because she would not be able to order any other pain meds (po tylenol  Given on day shift with no relief per pt).    Dr. Despina Hidden was then called and ordered a one time dose of Fentanyl IV .  Med given at 2149.   Pt stated the pain was much more tolerable after receiving Fentanyl.  She has also been using a Kpad at the site which does help the pain.  Tolerating Clear liquids and IVF infusing currently. She also states that her bleeding today has only been some "spotting" but that yesterday she "soaked thru three pads".  Will continue to monitor.

## 2018-07-29 DIAGNOSIS — O210 Mild hyperemesis gravidarum: Secondary | ICD-10-CM

## 2018-07-29 DIAGNOSIS — Z8759 Personal history of other complications of pregnancy, childbirth and the puerperium: Secondary | ICD-10-CM | POA: Insufficient documentation

## 2018-07-29 MED ORDER — GLYCOPYRROLATE 0.2 MG/ML IJ SOLN
0.2000 mg | Freq: Four times a day (QID) | INTRAMUSCULAR | Status: DC
  Administered 2018-07-29 – 2018-08-02 (×17): 0.2 mg via INTRAVENOUS
  Filled 2018-07-29 (×22): qty 1

## 2018-07-29 NOTE — Progress Notes (Signed)
Patient refused to doppler FHT stating it hurts stomach to have it done

## 2018-07-29 NOTE — Progress Notes (Signed)
Patient ID: Tammy Andrews, female   DOB: January 28, 1991, 28 y.o.   MRN: [id number]   Subjective: Interval History:Had a bad day yesterday with 2 showers and worsening LLQ pain. Could not get her usual relief of being in the shower.  Objective: Vital signs in last 24 hours: Temp:  [97.8 F (36.6 C)-99.2 F (37.3 C)] 98.3 F (36.8 C) (01/12 0534) Pulse Rate:  [79-96] 96 (01/12 0534) Resp:  [16-18] 16 (01/12 0534) BP: (101-149)/(56-94) 148/94 (01/12 0534) SpO2:  [100 %] 100 % (01/12 0534) Weight:  [105.5 kg] 105.5 kg (01/12 0534)  Intake/Output from previous day: 01/11 0701 - 01/12 0700 In: 300 [P.O.:300] Out: 410 [Urine:350] Intake/Output this shift: No intake/output data recorded.  General appearance: alert, cooperative and appears stated age Neck: supple, symmetrical, trachea midline Lungs: normal effort Abdomen: soft, no masses, generalized tenderness, less so with u/s but no rebound or guarding Extremities: Homans sign is negative, no sign of DVT  Studies/Results: No results found.  Scheduled Meds: . amitriptyline  50 mg Oral Daily  . cyclobenzaprine  10 mg Oral TID  . diphenhydrAMINE  25 mg Oral Q6H  . docusate sodium  100 mg Oral Daily  . escitalopram  20 mg Oral Daily  . glycopyrrolate  0.2 mg Intravenous QID  . Influenza vac split quadrivalent PF  0.5 mL Intramuscular Tomorrow-1000  . ketorolac  30 mg Intravenous Q8H  . prenatal multivitamin  1 tablet Oral Q1200  . promethazine  12.5 mg Intravenous Q6H  . scopolamine  1 patch Transdermal Q72H   Continuous Infusions: . ondansetron (ZOFRAN) IV 8 mg (07/29/18 0238)  . 0.9 % sodium chloride with kcl 150 mL/hr at 07/29/18 0136   PRN Meds:acetaminophen, calcium carbonate, zolpidem  Assessment/Plan: Patient Active Problem List   Diagnosis Date Noted  . Hyperemesis affecting pregnancy, antepartum 07/27/2018  . Substance abuse (HCC) 06/22/2018   No significant improvement Addition of robinul Already on and  continue Elavil, flexeril, Benadryl, Toradol Continue Zofran and phenergan Try to advance diet today--emphasized holistic approach, need for nutrition and sleep.   LOS: 1 day   Reva Bores, MD 07/29/2018 7:40 AM

## 2018-07-30 MED ORDER — POLYETHYLENE GLYCOL 3350 17 G PO PACK
17.0000 g | PACK | Freq: Every day | ORAL | Status: DC
  Administered 2018-07-30 – 2018-08-02 (×4): 17 g via ORAL
  Filled 2018-07-30 (×4): qty 1

## 2018-07-30 MED ORDER — ACETAMINOPHEN 160 MG/5ML PO SOLN
650.0000 mg | Freq: Four times a day (QID) | ORAL | Status: DC | PRN
  Administered 2018-07-30 – 2018-08-01 (×4): 650 mg via ORAL
  Filled 2018-07-30 (×4): qty 20.3

## 2018-07-30 NOTE — Plan of Care (Signed)
Pt denying vomiting this morning. Complaining of L lower abd pain.  Rn continuing to monitor patient, n/v and appetite.

## 2018-07-31 ENCOUNTER — Inpatient Hospital Stay (HOSPITAL_BASED_OUTPATIENT_CLINIC_OR_DEPARTMENT_OTHER): Payer: Managed Care, Other (non HMO)

## 2018-07-31 DIAGNOSIS — O99212 Obesity complicating pregnancy, second trimester: Secondary | ICD-10-CM | POA: Diagnosis not present

## 2018-07-31 DIAGNOSIS — O10012 Pre-existing essential hypertension complicating pregnancy, second trimester: Secondary | ICD-10-CM

## 2018-07-31 DIAGNOSIS — O210 Mild hyperemesis gravidarum: Secondary | ICD-10-CM

## 2018-07-31 DIAGNOSIS — O4692 Antepartum hemorrhage, unspecified, second trimester: Secondary | ICD-10-CM | POA: Diagnosis not present

## 2018-07-31 DIAGNOSIS — Z3A12 12 weeks gestation of pregnancy: Secondary | ICD-10-CM

## 2018-07-31 DIAGNOSIS — O211 Hyperemesis gravidarum with metabolic disturbance: Principal | ICD-10-CM

## 2018-07-31 LAB — URINALYSIS, ROUTINE W REFLEX MICROSCOPIC
Bilirubin Urine: NEGATIVE
Glucose, UA: NEGATIVE mg/dL
Ketones, ur: 80 mg/dL — AB
Nitrite: NEGATIVE
Protein, ur: 100 mg/dL — AB
RBC / HPF: >50 RBC/hpf — ABNORMAL HIGH (ref 0–5)
Specific Gravity, Urine: 1.014 (ref 1.005–1.030)
pH: 6 (ref 5.0–8.0)

## 2018-07-31 MED ORDER — HYDROMORPHONE HCL 1 MG/ML IJ SOLN
1.0000 mg | Freq: Once | INTRAMUSCULAR | Status: AC
  Administered 2018-07-31: 1 mg via INTRAVENOUS
  Filled 2018-07-31: qty 1

## 2018-07-31 MED ORDER — METHYLPREDNISOLONE 4 MG PO TABS: 8.0000 mg | ORAL_TABLET | Freq: Every day | ORAL | Status: DC

## 2018-07-31 MED ORDER — METHYLPREDNISOLONE 16 MG PO TABS
16.0000 mg | ORAL_TABLET | Freq: Every day | ORAL | Status: DC
  Administered 2018-08-01 – 2018-08-02 (×2): 16 mg via ORAL
  Filled 2018-07-31 (×3): qty 1

## 2018-07-31 MED ORDER — METHYLPREDNISOLONE 4 MG PO TABS
8.0000 mg | ORAL_TABLET | Freq: Every day | ORAL | Status: DC
  Filled 2018-07-31: qty 2

## 2018-07-31 MED ORDER — METHYLPREDNISOLONE 16 MG PO TABS
16.0000 mg | ORAL_TABLET | Freq: Every day | ORAL | Status: AC
  Administered 2018-08-01 – 2018-08-02 (×2): 16 mg via ORAL
  Filled 2018-07-31 (×2): qty 1

## 2018-07-31 MED ORDER — METHYLPREDNISOLONE 4 MG PO TABS: 4.0000 mg | ORAL_TABLET | Freq: Every day | ORAL | Status: DC

## 2018-07-31 MED ORDER — METHYLPREDNISOLONE 16 MG PO TABS
16.0000 mg | ORAL_TABLET | Freq: Every day | ORAL | Status: DC
  Administered 2018-08-01 (×2): 16 mg via ORAL
  Filled 2018-07-31 (×2): qty 1

## 2018-07-31 MED ORDER — PANTOPRAZOLE SODIUM 40 MG IV SOLR
40.0000 mg | INTRAVENOUS | Status: DC
  Administered 2018-07-31 – 2018-08-02 (×3): 40 mg via INTRAVENOUS
  Filled 2018-07-31 (×5): qty 40

## 2018-07-31 MED ORDER — METHYLPREDNISOLONE SODIUM SUCC 125 MG IJ SOLR
48.0000 mg | Freq: Once | INTRAMUSCULAR | Status: AC
  Administered 2018-07-31: 48 mg via INTRAVENOUS
  Filled 2018-07-31: qty 0.77

## 2018-07-31 NOTE — Progress Notes (Signed)
Dr. Alysia Penna notified of pt c/o lower abd pain, vomited up benadryl wanting something for pain.  Orders rec.

## 2018-07-31 NOTE — Progress Notes (Signed)
Pt called out sobbing. Rn  Noted old blood on patient's pad, approx 25% saturated from the past 3 hours.  Patient rating pain 10/10 in lower left abdomen.  FHR auscultated via doppler-160bpm.   Dr Alysia Penna MD notified of pain, bleeding and FHR tones. New orders: 1mg  Dilaudid IV once.

## 2018-07-31 NOTE — Progress Notes (Signed)
  Pharmacy Consult:   MEDROL (METHYLPREDNISOLONE) TAPER  FOR HYPEREMESIS GRAVIDARUM PATIENTS  The following is a 14 day taper of methylprednisolone for hyperemesis. Doses on day 1  will be given IV.  All doses starting on day 2  will be given PO. (If patient cannot tolerate oral medications, contact the pharmacy to change route to IV.)   Date Day Morning Midday Bedtime  07/31/18 1 16  mg 16 mg 16 mg  08/01/18 2 16  mg 16 mg 16 mg  08/02/18 3 16  mg 16 mg 16 mg  08/03/18 4 16  mg 8 mg 16 mg  08/04/18 5 16  mg 8 mg 8 mg  08/05/18 6 8  mg 8 mg 8 mg  08/06/18 7 8  mg 4 mg 8 mg  08/07/18 8 8  mg 4 mg 4 mg  08/08/18 9 8  mg 4 mg   08/09/18 10 8  mg 4 mg   08/10/18 11 8  mg    08/11/18 12 8  mg    08/12/18 13 4  mg    08/13/18 14 4  mg     Check fasting blood sugars daily while on the taper. Notify MD if fasting blood sugar>95.  Hovey-Rankin, Crescencio Jozwiak 07/31/2018

## 2018-07-31 NOTE — Progress Notes (Signed)
Patient ID: Tammy Andrews, female   DOB: 06/11/1991, 28 y.o.   MRN: [id number] FACULTY PRACTICE ANTEPARTUM(COMPREHENSIVE) NOTE  Tammy Andrews is a 28 y.o. G3P0110 at [redacted]w[redacted]d by best clinical estimate who is admitted for hyperemesis.   Fetal presentation is unsure. Length of Stay:  3  Days  Subjective: Still with pain and spotting, nausea and vomiting Patient reports the fetal movement as none. Patient reports uterine contraction  activity as none. Patient reports  vaginal bleeding as spotting. Patient describes fluid per vagina as None.  Vitals:  Blood pressure (!) 148/99, pulse 89, temperature 98.2 F (36.8 C), temperature source Oral, resp. rate 16, height 5\' 7"  (1.702 m), weight 104.3 kg, last menstrual period 04/19/2018, SpO2 100 %, unknown if currently breastfeeding. Physical Examination:  General appearance - ill-appearing Heart - normal rate and regular rhythm Abdomen - soft, nontender, nondistended Fundal Height:  size equals dates Extremities: extremities normal, atraumatic, no cyanosis or edema and Homans sign is negative, no sign of DVT  Membranes:intact  Fetal Monitoring:  pos FHT  Labs:  No results found for this or any previous visit (from the past 24 hour(s)).   Medications:  Scheduled . amitriptyline  50 mg Oral Daily  . cyclobenzaprine  10 mg Oral TID  . diphenhydrAMINE  25 mg Oral Q6H  . docusate sodium  100 mg Oral Daily  . escitalopram  20 mg Oral Daily  . glycopyrrolate  0.2 mg Intravenous QID  . Influenza vac split quadrivalent PF  0.5 mL Intramuscular Tomorrow-1000  . ketorolac  30 mg Intravenous Q8H  . pantoprazole (PROTONIX) IV  40 mg Intravenous Q24H  . polyethylene glycol  17 g Oral Daily  . prenatal multivitamin  1 tablet Oral Q1200  . promethazine  12.5 mg Intravenous Q6H  . scopolamine  1 patch Transdermal Q72H   I have reviewed the patient's current medications.  ASSESSMENT: Patient Active Problem List   Diagnosis Date Noted  . History  of fetal demise, not currently pregnant 07/29/2018  . Hyperemesis gravidarum before end of [redacted] week gestation with dehydration 07/27/2018  . Substance abuse (HCC) 06/22/2018    PLAN: Add steroid taper for hyperemesis Repeat US for bleeding  Tammy Andrews 07/31/2018,9:32 AM

## 2018-07-31 NOTE — Progress Notes (Signed)
Pt called out sobbing went into rm pt bleed on bed sheets from vagina, mod amt of bright red blood.   Doppler fetal heart tones auscultated and heard but unable to count fetus moved away from doppler.

## 2018-07-31 NOTE — Progress Notes (Signed)
Pt sobbing with abd. Pain pt vomited up benadryl given po. Will call the dr. To get something else for pain.

## 2018-08-01 LAB — URINE CULTURE: Special Requests: NORMAL

## 2018-08-01 LAB — GLUCOSE, RANDOM: Glucose, Bld: 82 mg/dL (ref 70–99)

## 2018-08-01 LAB — PROTEIN, URINE, 24 HOUR
COLLECTION INTERVAL-UPROT: 24 h
Protein, 24H Urine: 3713 mg/d — ABNORMAL HIGH (ref 50–100)
Protein, Urine: 150 mg/dL
Urine Total Volume-UPROT: 2475 mL

## 2018-08-01 LAB — GLUCOSE, CAPILLARY: Glucose-Capillary: 82 mg/dL (ref 70–99)

## 2018-08-01 MED ORDER — HYDROMORPHONE HCL 1 MG/ML IJ SOLN
INTRAMUSCULAR | Status: AC
  Administered 2018-08-01: 1 mg
  Filled 2018-08-01: qty 1

## 2018-08-01 MED ORDER — HYDROMORPHONE HCL 1 MG/ML IJ SOLN: 0.5000 mg | Freq: Once | INTRAMUSCULAR | Status: AC

## 2018-08-01 MED ORDER — ENSURE ENLIVE PO LIQD
237.0000 mL | Freq: Three times a day (TID) | ORAL | Status: DC
  Administered 2018-08-01 – 2018-08-02 (×2): 237 mL via ORAL
  Filled 2018-08-01 (×4): qty 237

## 2018-08-01 MED ORDER — HYDROMORPHONE HCL 1 MG/ML IJ SOLN
0.5000 mg | INTRAMUSCULAR | Status: DC | PRN
  Administered 2018-08-01 – 2018-08-02 (×4): 0.5 mg via INTRAVENOUS
  Filled 2018-08-01 (×5): qty 0.5

## 2018-08-01 NOTE — Progress Notes (Signed)
Patient ID: Tammy Andrews, female   DOB: [birth year], 28 y.o.   MRN: [id number] FACULTY PRACTICE ANTEPARTUM(COMPREHENSIVE) NOTE  Francesca Strome is a 28 y.o. G3P0110 at [redacted]w[redacted]d by early ultrasound who is admitted for hyperemesis.   Fetal presentation is unsure. Length of Stay:  4  Days  Subjective: Low abdominal cramps Patient reports the fetal movement as none. Patient reports uterine contraction  activity as none. Patient reports  vaginal bleeding as spotting. Patient describes fluid per vagina as None.  Vitals:  Blood pressure 139/85, pulse 92, temperature 99.2 F (37.3 C), temperature source Oral, resp. rate 18, height 5\' 7"  (1.702 m), weight 103 kg, last menstrual period 04/19/2018, SpO2 100 %, unknown if currently breastfeeding. Physical Examination:  General appearance - alert, well appearing, and in no distress Heart - normal rate and regular rhythm Abdomen - soft, nontender, nondistended Fundal Height:  size equals dates Extremities: extremities normal, atraumatic, no cyanosis or edema and Homans sign is negative, no sign of DVT Membranes:intact    Labs:  Results for orders placed or performed during the hospital encounter of 07/27/18 (from the past 24 hour(s))  Glucose, capillary   Collection Time: 08/01/18  5:44 AM  Result Value Ref Range   Glucose-Capillary 82 70 - 99 mg/dL  Glucose, fasting - daily while on taper   Collection Time: 08/01/18  5:51 AM  Result Value Ref Range   Glucose, Bld 82 70 - 99 mg/dL  OBSTETRICS REPORT                       (Signed Final 07/31/2018 02:42 pm) ---------------------------------------------------------------------- Patient Info  ID #:       [id number]                          D.O.B.:  08-Feb-1991 (27 yrs)  Name:       Tammy Andrews                 Visit Date: 07/31/2018 12:11 pm ---------------------------------------------------------------------- Performed By  Performed By:     Lenise Arena        Ref. Address:     [address]                                                             [place], [place]                                                             [address]  Attending:        Noralee Space MD  [facility]  Referred By:      Adam Phenix                    MD ---------------------------------------------------------------------- Orders   #  Description                          Code         Ordered By   1  [place] MFM OB LIMITED                    76815.01     JAMES ARNOLD  ----------------------------------------------------------------------   #  Order #                    Accession #                 Episode #   1  [id number]                  [id number]                  [id number]  ---------------------------------------------------------------------- Indications   [redacted] weeks gestation of pregnancy                Z3A.12   Vaginal bleeding in pregnancy, second          O46.92   trimester   Hyperemesis gravidarum                         O21.0   Hypertension - Chronic/Pre-existing            O10.019   Obesity complicating pregnancy, second         O99.212   trimester (BMI 36)  ---------------------------------------------------------------------- Vital Signs  Weight (lb): 230                               Height:        5'7"  BMI:         36.02 ---------------------------------------------------------------------- Fetal Evaluation  Num Of Fetuses:         1  Fetal Heart Rate(bpm):  151  Cardiac Activity:       Observed  Placenta:               Anterior  Amniotic Fluid  AFI FV:      Within normal limits  Comment:    Large subchorionic hemorrhage noted. 12.4 x 5.3 x 11.6cm ---------------------------------------------------------------------- OB History  Gravidity:    3         Prem:   1  TOP:          1        Living:   0 ---------------------------------------------------------------------- Gestational Age  LMP:           14w 5d        Date:  04/19/18                 EDD:   01/24/19  Best:          12w 2d     Det. ByMarcella Dubs         EDD:   02/10/19                                      (  06/24/18) ---------------------------------------------------------------------- Cervix Uterus Adnexa  Cervix  Normal appearance by transabdominal scan.  Uterus  No abnormality visualized.  Left Ovary  Within normal limits.  Right Ovary  Within normal limits.  Cul De Sac  No free fluid seen.  Adnexa  No abnormality visualized. ---------------------------------------------------------------------- Impression  Patient was admitted 2 days ago with hyperemesis  gravidarum and vaginal bleeding. She reports persistent  vaginal bleeding and abdominal pain.  A limited ultrasound study was performed. The CRL  measurement is consistent with her previously-established  dates (ultrasound performed on 06/22/18). Good fetal heart  activity is seen. A large subchorionic hematoma, measuring  12.4 x 5.3 x 11.6 cm, is seen at the inferior edge of the  placenta.  I explained the findings and that subchorionic hemorrhage  increases the risk of miscarriage and preterm delivery. ----------------------------------------------------------------------                  Noralee Spaceavi Shankar, MD Electronically Signed Final Report   07/31/2018 02:42 pm   Medications:  Scheduled . amitriptyline  50 mg Oral Daily  . cyclobenzaprine  10 mg Oral TID  . diphenhydrAMINE  25 mg Oral Q6H  . docusate sodium  100 mg Oral Daily  . escitalopram  20 mg Oral Daily  . glycopyrrolate  0.2 mg Intravenous QID  . Influenza vac split quadrivalent PF  0.5 mL Intramuscular Tomorrow-1000  . methylPREDNISolone  16 mg Oral Q breakfast   Followed by  . [START ON 08/05/2018] methylPREDNISolone  8 mg Oral Q breakfast   Followed by  . [START ON 08/12/2018]  methylPREDNISolone  4 mg Oral Q breakfast  . methylPREDNISolone  16 mg Oral Q1400   Followed by  . [START ON 08/03/2018] methylPREDNISolone  8 mg Oral Q1400   Followed by  . [START ON 08/06/2018] methylPREDNISolone  4 mg Oral Q1400  . methylPREDNISolone  16 mg Oral QHS   Followed by  . [START ON 08/04/2018] methylPREDNISolone  8 mg Oral QHS   Followed by  . [START ON 08/07/2018] methylPREDNISolone  4 mg Oral QHS  . pantoprazole (PROTONIX) IV  40 mg Intravenous Q24H  . polyethylene glycol  17 g Oral Daily  . prenatal multivitamin  1 tablet Oral Q1200  . promethazine  12.5 mg Intravenous Q6H  . scopolamine  1 patch Transdermal Q72H   I have reviewed the patient's current medications.  ASSESSMENT: Patient Active Problem List   Diagnosis Date Noted  . History of fetal demise, not currently pregnant 07/29/2018  . Hyperemesis gravidarum before end of [redacted] week gestation with dehydration 07/27/2018  . Substance abuse (HCC) 06/22/2018    PLAN: Some improvement in nausea, will observe to see if she can tolerate po medication and nutrition  Scheryl DarterJames Arnold 08/01/2018,10:01 AM

## 2018-08-02 DIAGNOSIS — O468X2 Other antepartum hemorrhage, second trimester: Secondary | ICD-10-CM

## 2018-08-02 DIAGNOSIS — O418X20 Other specified disorders of amniotic fluid and membranes, second trimester, not applicable or unspecified: Secondary | ICD-10-CM

## 2018-08-02 LAB — GLUCOSE, RANDOM: Glucose, Bld: 99 mg/dL (ref 70–99)

## 2018-08-02 LAB — GLUCOSE, CAPILLARY: Glucose-Capillary: 95 mg/dL (ref 70–99)

## 2018-08-02 MED ORDER — AMITRIPTYLINE HCL 50 MG PO TABS: 50.0000 mg | ORAL_TABLET | Freq: Every day | ORAL | 1 refills | Status: DC

## 2018-08-02 MED ORDER — METHYLPREDNISOLONE 4 MG PO TABS: ORAL_TABLET | ORAL | 0 refills | Status: DC

## 2018-08-02 MED ORDER — ESCITALOPRAM OXALATE 20 MG PO TABS: 20.0000 mg | ORAL_TABLET | Freq: Every day | ORAL | 1 refills | Status: DC

## 2018-08-02 MED ORDER — PRENATAL MULTIVITAMIN CH: 1.0000 | ORAL_TABLET | Freq: Every day | ORAL | 1 refills | Status: DC

## 2018-08-02 MED ORDER — ONDANSETRON 4 MG PO TBDP: 4.0000 mg | ORAL_TABLET | Freq: Four times a day (QID) | ORAL | 0 refills | Status: DC | PRN

## 2018-08-02 NOTE — Discharge Instructions (Signed)
Hyperemesis Gravidarum  Hyperemesis gravidarum is a severe form of nausea and vomiting that happens during pregnancy. Hyperemesis is worse than morning sickness. It may cause you to have nausea or vomiting all day for many days. It may keep you from eating and drinking enough food and liquids, which can lead to dehydration, malnutrition, and weight loss. Hyperemesis usually occurs during the first half (the first 20 weeks) of pregnancy. It often goes away once a woman is in her second half of pregnancy. However, sometimes hyperemesis continues through an entire pregnancy.  What are the causes?  The cause of this condition is not known. It may be related to changes in chemicals (hormones) in the body during pregnancy, such as the high level of pregnancy hormone (human chorionic gonadotropin) or the increase in the female sex hormone (estrogen).  What are the signs or symptoms?  Symptoms of this condition include:  Nausea that does not go away.  Vomiting that does not allow you to keep any food down.  Weight loss.  Body fluid loss (dehydration).  Having no desire to eat, or not liking food that you have previously enjoyed.  How is this diagnosed?  This condition may be diagnosed based on:  A physical exam.  Your medical history.  Your symptoms.  Blood tests.  Urine tests.  How is this treated?  This condition is managed by controlling symptoms. This may include:  Following an eating plan. This can help lessen nausea and vomiting.  Taking prescription medicines.  An eating plan and medicines are often used together to help control symptoms. If medicines do not help relieve nausea and vomiting, you may need to receive fluids through an IV at the hospital.  Follow these instructions at home:  Eating and drinking    Avoid the following:  Drinking fluids with meals. Try not to drink anything during the 30 minutes before and after your meals.  Drinking more than 1 cup of fluid at a time.  Eating foods that trigger your  symptoms. These may include spicy foods, coffee, high-fat foods, very sweet foods, and acidic foods.  Skipping meals. Nausea can be more intense on an empty stomach. If you cannot tolerate food, do not force it. Try sucking on ice chips or other frozen items and make up for missed calories later.  Lying down within 2 hours after eating.  Being exposed to environmental triggers. These may include food smells, smoky rooms, closed spaces, rooms with strong smells, warm or humid places, overly loud and noisy rooms, and rooms with motion or flickering lights. Try eating meals in a well-ventilated area that is free of strong smells.  Quick and sudden changes in your movement.  Taking iron pills and multivitamins that contain iron. If you take prescription iron pills, do not stop taking them unless your health care provider approves.  Preparing food. The smell of food can spoil your appetite or trigger nausea.  To help relieve your symptoms:  Listen to your body. Everyone is different and has different preferences. Find what works best for you.  Eat and drink slowly.  Eat 5-6 small meals daily instead of 3 large meals. Eating small meals and snacks can help you avoid an empty stomach.  In the morning, before getting out of bed, eat a couple of crackers to avoid moving around on an empty stomach.  Try eating starchy foods as these are usually tolerated well. Examples include cereal, toast, bread, potatoes, pasta, rice, and pretzels.  Include at   least 1 serving of protein with your meals and snacks. Protein options include lean meats, poultry, seafood, beans, nuts, nut butters, eggs, cheese, and yogurt.  Try eating a protein-rich snack before bed. Examples of a protein-rick snack include cheese and crackers or a peanut butter sandwich made with 1 slice of whole-wheat bread and 1 tsp (5 g) of peanut butter.  Eat or suck on things that have ginger in them. It may help relieve nausea. Add  tsp ground ginger to hot tea or  choose ginger tea.  Try drinking 100% fruit juice or an electrolyte drink. An electrolyte drink contains sodium, potassium, and chloride.  Drink fluids that are cold, clear, and carbonated or sour. Examples include lemonade, ginger ale, lemon-lime soda, ice water, and sparkling water.  Brush your teeth or use a mouth rinse after meals.  Talk with your health care provider about starting a supplement of vitamin B6.  General instructions  Take over-the-counter and prescription medicines only as told by your health care provider.  Follow instructions from your health care provider about eating or drinking restrictions.  Continue to take your prenatal vitamins as told by your health care provider. If you are having trouble taking your prenatal vitamins, talk with your health care provider about different options.  Keep all follow-up and pre-birth (prenatal) visits as told by your health care provider. This is important.  Contact a health care provider if:  You have pain in your abdomen.  You have a severe headache.  You have vision problems.  You are losing weight.  You feel weak or dizzy.  Get help right away if:  You cannot drink fluids without vomiting.  You vomit blood.  You have constant nausea and vomiting.  You are very weak.  You faint.  You have a fever and your symptoms suddenly get worse.  Summary  Hyperemesis gravidarum is a severe form of nausea and vomiting that happens during pregnancy.  Making some changes to your eating habits may help relieve nausea and vomiting.  This condition may be managed with medicine.  If medicines do not help relieve nausea and vomiting, you may need to receive fluids through an IV at the hospital.  This information is not intended to replace advice given to you by your health care provider. Make sure you discuss any questions you have with your health care provider.  Document Released: 07/04/2005 Document Revised: 07/24/2017 Document Reviewed: 03/02/2016  [employer] Interactive  Patient Education  2019 [employer]

## 2018-08-02 NOTE — Progress Notes (Signed)
Teaching complete  Pt waiting for ride home

## 2018-08-02 NOTE — Discharge Summary (Signed)
Physician Discharge Summary  Patient ID: Tammy Andrews MRN: [id number] DOB/AGE: 28/02/1991 27 y.o.  Admit date: 07/27/2018 Discharge date: 08/02/2018  Admission Diagnoses:Active Problems:   Hyperemesis gravidarum before end of [redacted] week gestation with dehydration   Subchorionic hemorrhage in second trimester  Discharge Diagnoses:  Active Problems:   Hyperemesis gravidarum before end of [redacted] week gestation with dehydration   Subchorionic hemorrhage in second trimester   Discharged Condition: fair  Hospital Course:  Patient presents with  . Emesis  . Fatigue  . Abdominal Pain  . Vaginal Bleeding   HPI   Tammy Andrews is a 28 y.o. female G3P0110 with a history of gastric sleeve surgery, and known large subchorionic hemorrhage  @ [redacted]w[redacted]d here with continued nausea and vomiting, abdominal pain, and vaginal bleeding. Says she has lost 30 lbs + since pregnancy. Says she has not had anything to eat in the past 2-3 weeks. She had a 6 month lost with previous pregnancy and feels depressed during this pregnancy.  Says she smokes pot 4-5 times per day and then sits in the shower for a few hours which helps some.  Taking phenergan 1x per day which does not help.  Husband is present today and is concerned that she is depressed. He says with her 6 month loss previously that this pregnancy has brought on a lot of the same emotions she had when losing the baby. He says that she has not eaten a single meal in 2-3 weeks.   Pre pregnancy weight 254          OB History    Gravida  3   Para  1   Term  0   Preterm  1   AB  1   Living  0     SAB  0   TAB  1   Ectopic  0   Multiple  0   Live Births  0           Past Medical History:  Diagnosis Date  . Anxiety   . Asthma    childhood  . Depression   . Preterm labor          Past Surgical History:  Procedure Laterality Date  . DILATION AND CURETTAGE OF UTERUS    . LAPAROSCOPIC GASTRIC BAND REMOVAL  WITH LAPAROSCOPIC GASTRIC SLEEVE RESECTION  08/2014         Family History  Problem Relation Age of Onset  . Hypertension Father   . Diabetes Paternal Grandmother     Social History        Tobacco Use  . Smoking status: Former Smoker    Last attempt to quit: 2018    Years since quitting: 2.0  . Smokeless tobacco: Never Used  Substance Use Topics  . Alcohol use: Not Currently    Comment: not in pregnancy  . Drug use: Not Currently    Types: Marijuana    Comment: last used 07-10-18    Allergies: No Known Allergies         Medications Prior to Admission  Medication Sig Dispense Refill Last Dose  . acetaminophen (TYLENOL) 500 MG tablet Take 500 mg by mouth every 6 (six) hours as needed for moderate pain.   11/07/2017 at Unknown time  . acetaminophen (TYLENOL) 650 MG suppository Place 1 suppository (650 mg total) rectally every 6 (six) hours as needed. 24 suppository 0   . famotidine (PEPCID) 20 MG tablet Take 1 tablet (20 mg total) by mouth 2 (  two) times daily. 30 tablet 2   . ibuprofen (ADVIL,MOTRIN) 600 MG tablet Take 1 tablet (600 mg total) by mouth every 6 (six) hours as needed. 15 tablet 0   . labetalol (NORMODYNE) 200 MG tablet Take 1 tablet (200 mg total) by mouth 2 (two) times daily. (Patient not taking: Reported on 11/07/2017) 60 tablet 3 Not Taking at Unknown time  . metoCLOPramide (REGLAN) 10 MG tablet Take 1 tablet (10 mg total) by mouth every 6 (six) hours. 30 tablet 0   . nitrofurantoin, macrocrystal-monohydrate, (MACROBID) 100 MG capsule Take 1 capsule (100 mg total) by mouth 2 (two) times daily. 14 capsule 0   . ondansetron (ZOFRAN ODT) 4 MG disintegrating tablet Take 1 tablet (4 mg total) by mouth every 6 (six) hours as needed for nausea. (Patient not taking: Reported on 11/07/2017) 20 tablet 0 Not Taking at Unknown time  . pantoprazole (PROTONIX) 20 MG tablet Take 1 tablet (20 mg total) by mouth daily. 30 tablet 2 11/06/2017 at Unknown time   . phenazopyridine (PYRIDIUM) 200 MG tablet Take 1 tablet (200 mg total) by mouth 3 (three) times daily. 6 tablet 0   . Prenatal Vit-Fe Fumarate-FA (PREPLUS) 27-1 MG TABS Take 1 tablet by mouth daily. 30 tablet 13   . promethazine (PHENERGAN) 25 MG suppository Place 1 suppository (25 mg total) rectally every 6 (six) hours as needed for nausea or vomiting. 20 each 0          Results for orders placed or performed during the hospital encounter of 07/27/18 (from the past 48 hour(s))  Urinalysis, Routine w reflex microscopic     Status: Abnormal   Collection Time: 07/27/18 11:38 AM  Result Value Ref Range   Color, Urine AMBER (A) YELLOW    Comment: BIOCHEMICALS MAY BE AFFECTED BY COLOR   APPearance HAZY (A) CLEAR   Specific Gravity, Urine 1.027 1.005 - 1.030   pH 6.0 5.0 - 8.0   Glucose, UA NEGATIVE NEGATIVE mg/dL   Hgb urine dipstick MODERATE (A) NEGATIVE   Bilirubin Urine NEGATIVE NEGATIVE   Ketones, ur 80 (A) NEGATIVE mg/dL   Protein, ur >=161>=300 (A) NEGATIVE mg/dL   Nitrite NEGATIVE NEGATIVE   Leukocytes, UA NEGATIVE NEGATIVE   RBC / HPF 6-10 0 - 5 RBC/hpf   WBC, UA 6-10 0 - 5 WBC/hpf   Bacteria, UA NONE SEEN NONE SEEN   Squamous Epithelial / LPF 0-5 0 - 5   Mucus PRESENT     Comment: Performed at [facility], [address] [place], [place] [address]  Comprehensive metabolic panel     Status: Abnormal   Collection Time: 07/27/18  1:42 PM  Result Value Ref Range   Sodium 133 (L) 135 - 145 mmol/L   Potassium 3.3 (L) 3.5 - 5.1 mmol/L   Chloride 101 98 - 111 mmol/L   CO2 18 (L) 22 - 32 mmol/L   Glucose, Bld 68 (L) 70 - 99 mg/dL   BUN 19 6 - 20 mg/dL   Creatinine, Ser 5.400.79 0.44 - 1.00 mg/dL   Calcium 9.3 8.9 - 98.110.3 mg/dL   Total Protein 8.3 (H) 6.5 - 8.1 g/dL   Albumin 4.2 3.5 - 5.0 g/dL   AST 17 15 - 41 U/L   ALT 17 0 - 44 U/L   Alkaline Phosphatase 34 (L) 38 - 126 U/L   Total Bilirubin 0.7 0.3 - 1.2 mg/dL   GFR calc non Af Amer  >60 >60 mL/min   GFR calc Af  Amer >60 >60 mL/min   Anion gap 14 5 - 15    Comment: Performed at [facility], [address] [place], [place] [address]   Review of Systems  Constitutional: Positive for activity change, appetite change and fatigue. Negative for fever.  Gastrointestinal: Positive for abdominal pain, anal bleeding, nausea and vomiting. Negative for diarrhea.  Genitourinary: Positive for vaginal bleeding and vaginal discharge. Negative for dysuria.  Neurological: Positive for weakness.   Physical Exam   Blood pressure 123/74, pulse 88, temperature 97.6 F (36.4 C), temperature source Oral, resp. rate 18, weight 98.9 kg, last menstrual period 04/19/2018, SpO2 100 %, unknown if currently breastfeeding.  Physical Exam  Constitutional: She is oriented to person, place, and time. She appears well-developed and well-nourished.  Non-toxic appearance. She has a sickly appearance. She appears ill. She appears distressed.  HENT:  Head: Normocephalic.  GI: Soft. Normal appearance. There is abdominal tenderness in the suprapubic area. There is no rigidity, no rebound and no guarding.  Genitourinary:    Genitourinary Comments: Vagina - Small amount of brown/pink vaginal discharge, no odor  Cervix - No contact bleeding, no active bleeding  Bimanual exam: Cervix closed, posterior  Chaperone present for exam.    Musculoskeletal: Normal range of motion.  Neurological: She is alert and oriented to person, place, and time.  Skin: Skin is warm.  Psychiatric: Her affect is labile. Her affect is not inappropriate. She is withdrawn. She exhibits a depressed mood.   MAU Course  Procedures  None  MDM  O positive blood type  30+ weight loss in pregnancy LR bolus & D5LR bolus Zofran 8 mg IV Pepcid 20 Haldol 2 mg IV and Toradol 60 mg IM CBC & CMP Discussed admission with Dr. Shawnie PonsPratt.  Assessment and Plan   A:   1. Cannabinoid hyperemesis syndrome (HCC)   2.  Rapid weight loss   3. Depressed mood     P:  Admit to [facility] pysch consult, dietician, and home health.   Venia Carbonasch, Jennifer I, NP 07/27/2018 2:35 PM She continue to have nausea and emesis until day 2 after starting steroid taper. Lower abdominal cramping and bleeding due to subchorionic hemorrhage continued but US showed the pregnancy was progressing. She tolerated a diet the day of discharge and she was [facility]  Consults: None  Significant Diagnostic Studies: radiology: Ultrasound: OB limited  Treatments: IV hydration and steroids: solu-medrol  Discharge Exam: Blood pressure (!) 152/102, pulse 100, temperature 98.2 F (36.8 C), temperature source Oral, resp. rate 18, height 5\' 7"  (1.702 m), weight 101.8 kg, last menstrual period 04/19/2018, SpO2 100 %, unknown if currently breastfeeding. General appearance: alert, cooperative and no distress Head: Normocephalic, without obvious abnormality, atraumatic  Disposition:  Discharge disposition: 01-Home or Self Care        Allergies as of 08/02/2018   No Known Allergies     Medication List    STOP taking these medications   acetaminophen 500 MG tablet Commonly known as:  TYLENOL   acetaminophen 650 MG suppository Commonly known as:  TYLENOL   famotidine 20 MG tablet Commonly known as:  PEPCID   ibuprofen 600 MG tablet Commonly known as:  ADVIL,MOTRIN   labetalol 200 MG tablet Commonly known as:  NORMODYNE   metoCLOPramide 10 MG tablet Commonly known as:  REGLAN   nitrofurantoin (macrocrystal-monohydrate) 100 MG capsule Commonly known as:  MACROBID   phenazopyridine 200 MG tablet Commonly known as:  PYRIDIUM  TAKE these medications   amitriptyline 50 MG tablet Commonly known as:  ELAVIL Take 1 tablet (50 mg total) by mouth daily. Start taking on:  August 03, 2018   escitalopram 20 MG tablet Commonly known as:  LEXAPRO Take 1 tablet (20 mg total) by mouth  daily. Start taking on:  August 03, 2018   methylPREDNISolone 4 MG tablet Commonly known as:  MEDROL 4 tablets at night day one, 2 tablets PO for 3 days, then finish with one tablet PO HS   ondansetron 4 MG disintegrating tablet Commonly known as:  ZOFRAN ODT Take 1 tablet (4 mg total) by mouth every 6 (six) hours as needed for nausea.   prenatal multivitamin Tabs tablet Take 1 tablet by mouth daily at 12 noon. What changed:    medication strength  when to take this   promethazine 25 MG tablet Commonly known as:  PHENERGAN Take 25 mg by mouth every 6 (six) hours as needed for nausea or vomiting. What changed:  Another medication with the same name was removed. Continue taking this medication, and follow the directions you see here.      Follow-up Information    [facility] Follow up in 1 day(s).   Specialty:  Obstetrics and Gynecology Contact information: [address] [place] [address] [phone]          Signed: Scheryl Darter 08/02/2018, 11:52 AM

## 2018-08-03 ENCOUNTER — Ambulatory Visit (INDEPENDENT_AMBULATORY_CARE_PROVIDER_SITE_OTHER): Payer: Medicaid Other | Admitting: Advanced Practice Midwife

## 2018-08-03 ENCOUNTER — Encounter: Payer: Self-pay | Admitting: Advanced Practice Midwife

## 2018-08-03 VITALS — BP 125/65 | HR 104 | Wt 227.3 lb

## 2018-08-03 DIAGNOSIS — K5900 Constipation, unspecified: Secondary | ICD-10-CM | POA: Diagnosis not present

## 2018-08-03 DIAGNOSIS — O99611 Diseases of the digestive system complicating pregnancy, first trimester: Secondary | ICD-10-CM | POA: Diagnosis not present

## 2018-08-03 DIAGNOSIS — Z3482 Encounter for supervision of other normal pregnancy, second trimester: Secondary | ICD-10-CM | POA: Diagnosis present

## 2018-08-03 DIAGNOSIS — O10912 Unspecified pre-existing hypertension complicating pregnancy, second trimester: Secondary | ICD-10-CM

## 2018-08-03 DIAGNOSIS — O10919 Unspecified pre-existing hypertension complicating pregnancy, unspecified trimester: Secondary | ICD-10-CM

## 2018-08-03 DIAGNOSIS — O0990 Supervision of high risk pregnancy, unspecified, unspecified trimester: Secondary | ICD-10-CM | POA: Insufficient documentation

## 2018-08-03 DIAGNOSIS — O211 Hyperemesis gravidarum with metabolic disturbance: Secondary | ICD-10-CM | POA: Diagnosis not present

## 2018-08-03 DIAGNOSIS — Z23 Encounter for immunization: Secondary | ICD-10-CM

## 2018-08-03 DIAGNOSIS — F1910 Other psychoactive substance abuse, uncomplicated: Secondary | ICD-10-CM

## 2018-08-03 DIAGNOSIS — Z3480 Encounter for supervision of other normal pregnancy, unspecified trimester: Secondary | ICD-10-CM

## 2018-08-03 DIAGNOSIS — O468X2 Other antepartum hemorrhage, second trimester: Secondary | ICD-10-CM

## 2018-08-03 DIAGNOSIS — Z8759 Personal history of other complications of pregnancy, childbirth and the puerperium: Secondary | ICD-10-CM

## 2018-08-03 DIAGNOSIS — O418X20 Other specified disorders of amniotic fluid and membranes, second trimester, not applicable or unspecified: Secondary | ICD-10-CM | POA: Diagnosis not present

## 2018-08-03 MED ORDER — BISACODYL 10 MG RE SUPP: 10.0000 mg | Freq: Every day | RECTAL | 1 refills | Status: DC | PRN

## 2018-08-03 NOTE — Progress Notes (Signed)
Subjective:    Tammy Andrews is being seen today for her first obstetrical visit.  This is not a planned pregnancy, but she and her husband are adjusting to the idea. She is at [redacted]w[redacted]d gestation by 7 week [place]. Dating reviewed w/ Pt.  Her obstetrical history is significant for CHTN, HEG,  fetal demise   At ~ 6 months. Pt is poor historian, but states baby was deceased upon arrival and was small for gestational age. Relationship with FOB: spouse, living together. Patient does intend to breast feed. Pregnancy history fully reviewed.  Pt was recently discharged after admission for HEG. Also has large [facility] and is still having small amount of bleeding. Of note she did a 24 hour urine while inpatient that was 3700, but pt reports that it was contaminated with a moderate amount of blood.   Patient reports significant improvement in N/V. Feeling much better.   Review of Systems:   Review of Systems  Constitutional: Negative for appetite change (Improved), chills and fever.  Gastrointestinal: Negative for abdominal pain, nausea and vomiting.  Genitourinary: Positive for vaginal bleeding. Negative for dysuria and vaginal discharge.  Neurological: Negative for dizziness.  Psychiatric/Behavioral: Negative for dysphoric mood. The patient is nervous/anxious.     Objective:     BP 125/65   Pulse (!) 104   Wt 227 lb 4.8 oz (103.1 kg)   LMP 04/19/2018 (Approximate)   BMI 35.60 kg/m  Physical Exam  Nursing note and vitals reviewed. Constitutional: She is oriented to person, place, and time. She appears well-developed and well-nourished. No distress.  Eyes: No scleral icterus.  Cardiovascular: Normal rate, regular rhythm and normal heart sounds.  Respiratory: Effort normal and breath sounds normal. No respiratory distress.  GI: Soft. She exhibits no distension. There is no abdominal tenderness.  Genitourinary:    Genitourinary Comments: declined   Musculoskeletal: Normal range of motion.      General: No edema.  Neurological: She is alert and oriented to person, place, and time.  Skin: Skin is warm and dry.  Psychiatric: She has a normal mood and affect.      Fetal Exam Fetal Monitor Review: Mode: hand-held doppler probe.   Baseline rate: + FHR.         Assessment:     1. Supervision of other normal pregnancy, antepartum  - Flu Vaccine QUAD 36+ mos IM - Genetic Screening - Hemoglobinopathy Evaluation - Obstetric Panel, Including HIV - [place] MFM OB DETAIL +14 WK; Future  2. History of fetal demise, not currently pregnant - Request records - Plan antenatal testing - [facility] at [place]  3. Hyperemesis gravidarum before end of [redacted] week gestation with dehydration - Stable on current regimen   4. Subchorionic hemorrhage of placenta in second trimester, single or unspecified fetus - Bleeding decreased - Bleeding precautions.   5. Substance abuse (HCC) - Encouraged to avoid [facility]  6. Chronic hypertension affecting pregnancy - Plan antenatal testing - Protein / creatinine ratio, urine---CONTAMINATED-NEED TO RECOLLECT - ASA  7. Constipation during pregnancy in first trimester  - bisacodyl (DULCOLAX) 10 MG suppository; Place 1 suppository (10 mg total) rectally daily as needed for moderate constipation.  Dispense: 12 suppository; Refill: 1   Plan:     Initial labs drawn. Prenatal vitamins. Problem list reviewed and updated. NIPS discussed: ordered. Role of ultrasound in pregnancy discussed; fetal survey: ordered. Amniocentesis discussed: not indicated. Follow up in 4 weeks. .Discussed clinic routines, schedule of care and testing, genetic screening options, involvement of students  and residents under the direct supervision of APPs and doctors and presence of female providers. Pt verbalized understanding.    Dorathy Kinsman 08/03/2018

## 2018-08-04 LAB — PROTEIN / CREATININE RATIO, URINE
CREATININE, UR: 269.2 mg/dL
Protein, Ur: 197.1 mg/dL
Protein/Creat Ratio: 732 mg/g creat — ABNORMAL HIGH (ref 0–200)

## 2018-08-06 ENCOUNTER — Inpatient Hospital Stay (HOSPITAL_COMMUNITY)
Admission: AD | Admit: 2018-08-06 | Discharge: 2018-08-06 | Disposition: A | Discharge status: Home / Self Care | Payer: Managed Care, Other (non HMO) | Attending: Obstetrics and Gynecology | Admitting: Obstetrics and Gynecology

## 2018-08-06 ENCOUNTER — Inpatient Hospital Stay (HOSPITAL_COMMUNITY): Payer: Managed Care, Other (non HMO)

## 2018-08-06 ENCOUNTER — Encounter (HOSPITAL_COMMUNITY): Payer: Self-pay | Admitting: *Deleted

## 2018-08-06 DIAGNOSIS — M549 Dorsalgia, unspecified: Secondary | ICD-10-CM | POA: Diagnosis present

## 2018-08-06 DIAGNOSIS — K5900 Constipation, unspecified: Secondary | ICD-10-CM | POA: Diagnosis not present

## 2018-08-06 DIAGNOSIS — Z87891 Personal history of nicotine dependence: Secondary | ICD-10-CM | POA: Diagnosis not present

## 2018-08-06 DIAGNOSIS — Z3A13 13 weeks gestation of pregnancy: Secondary | ICD-10-CM | POA: Diagnosis not present

## 2018-08-06 DIAGNOSIS — O9989 Other specified diseases and conditions complicating pregnancy, childbirth and the puerperium: Secondary | ICD-10-CM

## 2018-08-06 DIAGNOSIS — O99612 Diseases of the digestive system complicating pregnancy, second trimester: Secondary | ICD-10-CM

## 2018-08-06 DIAGNOSIS — O99611 Diseases of the digestive system complicating pregnancy, first trimester: Secondary | ICD-10-CM

## 2018-08-06 DIAGNOSIS — O99619 Diseases of the digestive system complicating pregnancy, unspecified trimester: Secondary | ICD-10-CM

## 2018-08-06 DIAGNOSIS — O99891 Other specified diseases and conditions complicating pregnancy: Secondary | ICD-10-CM

## 2018-08-06 LAB — URINALYSIS, ROUTINE W REFLEX MICROSCOPIC
Bilirubin Urine: NEGATIVE
Glucose, UA: NEGATIVE mg/dL
Ketones, ur: NEGATIVE mg/dL
Nitrite: NEGATIVE
Protein, ur: 100 mg/dL — AB
Specific Gravity, Urine: 1.024 (ref 1.005–1.030)
pH: 6 (ref 5.0–8.0)

## 2018-08-06 LAB — CBC WITH DIFFERENTIAL/PLATELET
Basophils Absolute: 0 10*3/uL (ref 0.0–0.1)
Basophils Relative: 0 %
EOS PCT: 1 %
Eosinophils Absolute: 0.1 10*3/uL (ref 0.0–0.5)
HCT: 27.9 % — ABNORMAL LOW (ref 36.0–46.0)
Hemoglobin: 9.6 g/dL — ABNORMAL LOW (ref 12.0–15.0)
LYMPHS PCT: 19 %
Lymphs Abs: 2.5 10*3/uL (ref 0.7–4.0)
MCH: 31.7 pg (ref 26.0–34.0)
MCHC: 34.4 g/dL (ref 30.0–36.0)
MCV: 92.1 fL (ref 80.0–100.0)
Monocytes Absolute: 0.5 10*3/uL (ref 0.1–1.0)
Monocytes Relative: 4 %
Neutro Abs: 10.5 10*3/uL — ABNORMAL HIGH (ref 1.7–7.7)
Neutrophils Relative %: 76 %
Platelets: 272 10*3/uL (ref 150–400)
RBC: 3.03 MIL/uL — ABNORMAL LOW (ref 3.87–5.11)
RDW: 13.7 % (ref 11.5–15.5)
WBC: 13.6 10*3/uL — ABNORMAL HIGH (ref 4.0–10.5)
nRBC: 0 % (ref 0.0–0.2)

## 2018-08-06 LAB — BASIC METABOLIC PANEL
Anion gap: 9 (ref 5–15)
BUN: 9 mg/dL (ref 6–20)
CO2: 23 mmol/L (ref 22–32)
Calcium: 8.8 mg/dL — ABNORMAL LOW (ref 8.9–10.3)
Chloride: 101 mmol/L (ref 98–111)
Creatinine, Ser: 0.54 mg/dL (ref 0.44–1.00)
GFR calc Af Amer: >60 mL/min (ref >60)
GFR calc non Af Amer: >60 mL/min (ref >60)
Glucose, Bld: 85 mg/dL (ref 70–99)
Potassium: 3.8 mmol/L (ref 3.5–5.1)
Sodium: 133 mmol/L — ABNORMAL LOW (ref 135–145)

## 2018-08-06 LAB — RAPID URINE DRUG SCREEN, HOSP PERFORMED
Amphetamines: NOT DETECTED
Barbiturates: NOT DETECTED
Benzodiazepines: NOT DETECTED
Cocaine: NOT DETECTED
Opiates: NOT DETECTED
Tetrahydrocannabinol: POSITIVE — AB

## 2018-08-06 LAB — MAGNESIUM: Magnesium: 1.9 mg/dL (ref 1.7–2.4)

## 2018-08-06 MED ORDER — ASPIRIN EC 81 MG PO TBEC: 81.0000 mg | DELAYED_RELEASE_TABLET | Freq: Every day | ORAL | 2 refills | Status: DC

## 2018-08-06 MED ORDER — M.V.I. ADULT IV INJ
Freq: Once | INTRAVENOUS | Status: AC
  Administered 2018-08-06: 19:00:00 via INTRAVENOUS
  Filled 2018-08-06: qty 1000

## 2018-08-06 MED ORDER — PEG 3350-KCL-NABCB-NACL-NASULF 236 G PO SOLR: 4000.0000 mL | Freq: Once | ORAL | 0 refills | Status: AC

## 2018-08-06 MED ORDER — PROMETHAZINE HCL 25 MG/ML IJ SOLN
25.0000 mg | Freq: Once | INTRAVENOUS | Status: AC
  Administered 2018-08-06: 25 mg via INTRAVENOUS
  Filled 2018-08-06: qty 1

## 2018-08-06 MED ORDER — HYDROMORPHONE HCL 1 MG/ML IJ SOLN
1.0000 mg | Freq: Once | INTRAMUSCULAR | Status: AC
  Administered 2018-08-06: 1 mg via INTRAVENOUS
  Filled 2018-08-06: qty 1

## 2018-08-06 MED ORDER — FAMOTIDINE IN NACL 20-0.9 MG/50ML-% IV SOLN
20.0000 mg | Freq: Once | INTRAVENOUS | Status: AC
  Administered 2018-08-06: 20 mg via INTRAVENOUS
  Filled 2018-08-06: qty 50

## 2018-08-06 MED ORDER — ZOLPIDEM TARTRATE 5 MG PO TABS
10.0000 mg | ORAL_TABLET | Freq: Once | ORAL | Status: AC
  Administered 2018-08-06: 10 mg via ORAL
  Filled 2018-08-06: qty 2

## 2018-08-06 NOTE — MAU Provider Note (Addendum)
History     CSN: [id number]  Arrival date and time: 08/06/18 1528   None     Chief Complaint  Patient presents with  . Abdominal Pain  . Back Pain  . Emesis  . Constipation   Tammy Andrews is a 28 y.o. G3P0110 at [redacted]w[redacted]d presenting with severe mid-back pain (10/10). Patient is upset and crying loudly, with intermittent bouts of wretching (occasionally bringing up bile). She said nothing has helped the pain, it is as bad as it was at her last admission "and I had to have dilaudid" for it. She reports the nausea is due to the pain. She also has increased spitting, and no BM x10 days despite trying two enemas, stool softeners twice, and Miralax. She says she has been able to tolerate PO intake, and has not used marijuana since before her last admission.    OB History    Gravida  3   Para  1   Term  0   Preterm  1   AB  1   Living  0     SAB  0   TAB  1   Ectopic  0   Multiple  0   Live Births  0           Past Medical History:  Diagnosis Date  . Anxiety   . Asthma    childhood  . Depression   . Preterm labor     Past Surgical History:  Procedure Laterality Date  . DILATION AND CURETTAGE OF UTERUS    . LAPAROSCOPIC GASTRIC BAND REMOVAL WITH LAPAROSCOPIC GASTRIC SLEEVE RESECTION  08/2014    Family History  Problem Relation Age of Onset  . Hypertension Father   . Diabetes Paternal Grandmother     Social History   Tobacco Use  . Smoking status: Former Smoker    Last attempt to quit: 2018    Years since quitting: 2.0  . Smokeless tobacco: Never Used  Substance Use Topics  . Alcohol use: Not Currently    Comment: not in pregnancy  . Drug use: Not Currently    Types: Marijuana    Comment: last used 07-26-18    Allergies: No Known Allergies  Medications Prior to Admission  Medication Sig Dispense Refill Last Dose  . amitriptyline (ELAVIL) 50 MG tablet Take 1 tablet (50 mg total) by mouth daily. 20 tablet 1 Past Week at Unknown time  .  bisacodyl (DULCOLAX) 10 MG suppository Place 1 suppository (10 mg total) rectally daily as needed for moderate constipation. 12 suppository 1 Past Week at Unknown time  . escitalopram (LEXAPRO) 20 MG tablet Take 1 tablet (20 mg total) by mouth daily. 30 tablet 1 Past Week at Unknown time  . ondansetron (ZOFRAN ODT) 4 MG disintegrating tablet Take 1 tablet (4 mg total) by mouth every 6 (six) hours as needed for nausea. 20 tablet 0 Past Week at Unknown time  . promethazine (PHENERGAN) 25 MG tablet Take 25 mg by mouth every 6 (six) hours as needed for nausea or vomiting.   Past Week at Unknown time  . methylPREDNISolone (MEDROL) 4 MG tablet 4 tablets at night day one, 2 tablets PO for 3 days, then finish with one tablet PO HS 15 tablet 0 Unknown  . Prenatal Vit-Fe Fumarate-FA (PRENATAL MULTIVITAMIN) TABS tablet Take 1 tablet by mouth daily at 12 noon. 30 tablet 1 Has not started    Review of Systems  Constitutional: Negative for appetite change and fever.  HENT:  Negative.   Eyes: Negative.  Negative for visual disturbance.  Respiratory: Negative.  Negative for chest tightness and shortness of breath.   Cardiovascular: Negative.  Negative for chest pain.  Gastrointestinal: Positive for constipation, nausea and vomiting. Negative for abdominal pain and diarrhea.  Genitourinary: Negative.  Negative for difficulty urinating, pelvic pain, urgency, vaginal bleeding and vaginal discharge.  Musculoskeletal: Positive for back pain (mid-back 10/10).  Skin: Negative.   Allergic/Immunologic: Negative.   Neurological: Negative.   Hematological: Negative.   Psychiatric/Behavioral: Positive for agitation (Upset, crying loudly).   Physical Exam   Blood pressure (!) 161/98, pulse (!) 101, temperature 98.3 F (36.8 C), temperature source Oral, resp. rate 20, weight 104 kg, last menstrual period 04/19/2018, SpO2 100 %, unknown if currently breastfeeding.  Physical Exam  Nursing note and vitals  reviewed. Constitutional: She is oriented to person, place, and time. She appears well-developed and well-nourished. She appears distressed.  HENT:  Head: Normocephalic.  Cardiovascular: Normal rate and regular rhythm.  Respiratory: Effort normal. No respiratory distress.  GI:  Could not complete abd exam, patient doubled over and unable to straighten   Musculoskeletal: Normal range of motion.  Neurological: She is alert and oriented to person, place, and time.  Skin: Skin is warm and dry. She is not diaphoretic.  Psychiatric:  Difficulty verbalizing beyond pain level and what hasn't worked to produce a bowel movement. Spent entire exam crying and/or retching from pain.     MAU Course  Procedures  MDM Pepcid, Phenergan, Dilaudid, LR with multivitamin infusion UA, UDS, renal ultrasound Enema after ultrasound is completed - 1L of soap suds enema in and pt reported no urge to void. Consult with MD ([place]) CBC, BMP, serum magnesium level X-ray of abdomen (supine/upright)  Assessment and Plan    Tammy Andrews, SNM 08/06/2018, 5:32 PM   CNM attestation:  I have seen and examined this patient and agree with above documentation in the SNM's note.   Tammy BareChynna Miklas is a 28 y.o. G3P0110 at 6871w4d reporting severe mid-back pain; rated 10/10. She is crying and very upset. She has vomiting of mostly bile and spit. She reports not having a BM in > 11 days. She has tried Miralax, enemas, stool softeners. She states the N/V is from the pain. She states that the last time she was here she got Dilaudid and that helped with her pain. She is able to eat and drink. She denies any marijuana use since she was last here. +FM, denies LOF, VB, contractions, vaginal discharge.  PE: Patient Vitals for the past 24 hrs:  BP Temp Temp src Pulse Resp SpO2 Weight  08/06/18 1815 119/69 - - 94 - - -  08/06/18 1547 (!) 161/98 98.3 F (36.8 C) Oral (!) 101 20 100 % 104 kg   Gen: calm comfortable,  NAD Resp: normal effort, no distress Heart: Regular rate Abd: Soft, NT, gravid, S=D  FHTs by doppler: 152 bpm  ROS, labs, PMH reviewed  Orders Placed This Encounter  Procedures  . US RENAL  . DG Abd 2 Views  . Urinalysis, Routine w reflex microscopic  . Urine rapid drug screen (hosp performed)  . CBC with Differential/Platelet  . Basic metabolic panel  . Magnesium  . Soap suds enema  . Discharge patient   Meds ordered this encounter  Medications  . promethazine (PHENERGAN) 25 mg in lactated ringers 1,000 mL infusion  . lactated ringers 1,000 mL with multivitamins adult (INFUVITE ADULT) 10 mL infusion  . famotidine (  PEPCID) IVPB 20 mg premix  . HYDROmorphone (DILAUDID) injection 1 mg  . HYDROmorphone (DILAUDID) injection 1 mg  . polyethylene glycol (GOLYTELY/NULYTELY) 236 g solution    Sig: Take 4,000 mLs by mouth once for 1 dose.    Dispense:  4000 mL    Refill:  0    MDM IVFs: Phenergan 25 mg in LR 1000 ml @ 999 ml/hr; followed by MVI in LR 1000 ml @ 500 ml/hr -- somewhat improved nausea/vomiting Abdominal X-Ray (upright and supine)  Assessment: 1. Constipation during pregnancy in second trimester   2. Severe back pain   3. Constipation during pregnancy   4. Back pain affecting pregnancy in second trimester     Plan: - Discharge home  - Rx for Golytely sent -- advised to drink until BM happend  - Follow-up in [facility] on Thursday 1/23 or Friday 1/24 and as scheduled at your doctor's office for next prenatal visit or sooner as needed if symptoms worsen. - Return to maternity admissions if symptoms worsen  I personally was present during the history, physical exam, and medical decision-making activities of this service and have verified that the service and findings are accurately documented in the student's note.   Raelyn MoraDawson, Taraann Olthoff, CNM 08/06/2018 9:13 PM

## 2018-08-06 NOTE — MAU Note (Signed)
Her stomach and back hurts so bad.  Has not had a bm in like 10days. (pt in tears)  Provider at the clinic told her to do enemas- did 2 with no results.  Also vomiting.  Continues to bleed

## 2018-08-06 NOTE — MAU Note (Signed)
Pt sobbing during IV start, states her back is "killing her."  Pt also retching @ times.  Provider informed.

## 2018-08-06 NOTE — Patient Instructions (Signed)

## 2018-08-06 NOTE — Progress Notes (Signed)
Dr. Vergie Living in to discuss plan for discharge with pt. Requests Ambien prior to discharge, med ordered.

## 2018-08-07 ENCOUNTER — Encounter: Payer: Self-pay | Admitting: Family Medicine

## 2018-08-07 ENCOUNTER — Telehealth: Payer: Self-pay | Admitting: Obstetrics and Gynecology

## 2018-08-07 LAB — HEMOGLOBINOPATHY EVALUATION
Ferritin: 147 ng/mL (ref 15–150)
HGB A: 97.7 % (ref 96.4–98.8)
HGB SOLUBILITY: NEGATIVE
Hgb A2 Quant: 2.3 % (ref 1.8–3.2)
Hgb C: 0 %
Hgb F Quant: 0 % (ref 0.0–2.0)
Hgb S: 0 %
Hgb Variant: 0 %

## 2018-08-07 LAB — OBSTETRIC PANEL, INCLUDING HIV
Antibody Screen: NEGATIVE
Basophils Absolute: 0 10*3/uL (ref 0.0–0.2)
Basos: 0 %
EOS (ABSOLUTE): 0.1 10*3/uL (ref 0.0–0.4)
EOS: 0 %
HEMOGLOBIN: 9.8 g/dL — AB (ref 11.1–15.9)
HIV Screen 4th Generation wRfx: NONREACTIVE
Hematocrit: 28.6 % — ABNORMAL LOW (ref 34.0–46.6)
Hepatitis B Surface Ag: NEGATIVE
IMMATURE GRANULOCYTES: 0 %
Immature Grans (Abs): 0 10*3/uL (ref 0.0–0.1)
Lymphocytes Absolute: 5.4 10*3/uL — ABNORMAL HIGH (ref 0.7–3.1)
Lymphs: 40 %
MCH: 31.4 pg (ref 26.6–33.0)
MCHC: 34.3 g/dL (ref 31.5–35.7)
MCV: 92 fL (ref 79–97)
Monocytes Absolute: 1.3 10*3/uL — ABNORMAL HIGH (ref 0.1–0.9)
Monocytes: 9 %
Neutrophils Absolute: 6.8 10*3/uL (ref 1.4–7.0)
Neutrophils: 51 %
Platelets: 268 10*3/uL (ref 150–450)
RBC: 3.12 x10E6/uL — ABNORMAL LOW (ref 3.77–5.28)
RDW: 13.8 % (ref 11.7–15.4)
RH TYPE: POSITIVE
RPR Ser Ql: NONREACTIVE
Rubella Antibodies, IGG: 17.9 index (ref >0.99)
WBC: 13.6 10*3/uL — ABNORMAL HIGH (ref 3.4–10.8)

## 2018-08-07 NOTE — Telephone Encounter (Signed)
Called and left a VM about her appointment on 01/23

## 2018-08-08 ENCOUNTER — Encounter: Payer: Self-pay | Admitting: Family Medicine

## 2018-08-08 NOTE — BH Specialist Note (Signed)
Integrated Behavioral Health Initial Visit  MRN: [id number] Name: Rhylynn Rudie  Number of Integrated Behavioral Health Clinician visits:: 1/6 Session Start time: 2:50  Session End time: 3:25 Total time: 35 minutes  Type of Service: Integrated Behavioral Health- Individual/Family Interpretor:No. Interpretor Name and Language: n/a   Warm Hand Off Completed.       SUBJECTIVE: Christany Beller is a 28 y.o. female accompanied by n/a Patient was referred by Kimmswick Bing, MD for depression and anxiety. Patient reports the following symptoms/concerns: Pt states her primary concern today is abdominal pain and hyperemesis; pt attributes symptoms of depression and anxiety to physical pain, fear of losing another baby, and moving to [place] with no support system.  Duration of problem: Current pregnancy; Severity of problem: severe  OBJECTIVE: Mood: Anxious and Depressed and Affect: Tearful Risk of harm to self or others: No plan to harm self or others  LIFE CONTEXT: Family and Social: Pt lives with FOB and his child School/Work: - Self-Care: Warm baths at night to cope with pain Life Changes: Current pregnancy and move to [place]   GOALS ADDRESSED: Patient will: 1. Reduce symptoms of: anxiety, depression, insomnia and stress 2. Increase knowledge and/or ability of: coping skills and healthy habits  3. Demonstrate ability to: Increase healthy adjustment to current life circumstances and Increase adequate support systems for patient/family  INTERVENTIONS: Interventions utilized: Mindfulness or Management consultant, Psychoeducation and/or Health Education and Link to [employer]  Standardized Assessments completed: GAD-7 and PHQ 9  ASSESSMENT: Patient currently experiencing Adjustment disorder with mixed anxious and depressed mood.   Patient may benefit from psychoeducation and brief therapeutic interventions regarding coping with symptoms of depression and  anxiety .  PLAN: 1. Follow up with behavioral health clinician on : Next medical visit, or earlier, as needed 2. Behavioral recommendations:  -CALM relaxation breathing exercise twice daily (morning; at bedtime) -Read educational materials regarding coping with symptoms of depression and anxiety -Consider apps as additional self-coping strategy for distraction from physical pain -Consider [facility] for additional community support 3. Referral(s): Integrated Art gallery manager (In Clinic) and [employer]:  [employer] 4. "From scale of 1-10, how likely are you to follow plan?": -  Rae Lips, LCSW  Depression screen [facility] 2/9 08/09/2018 08/03/2018  Decreased Interest 3 3  Down, Depressed, Hopeless 3 2  PHQ - 2 Score 6 5  Altered sleeping 3 3  Tired, decreased energy 3 3  Change in appetite 3 3  Feeling bad or failure about yourself  3 0  Trouble concentrating 3 3  Moving slowly or fidgety/restless 3 3  Suicidal thoughts 0 0  PHQ-9 Score 24 20   GAD 7 : Generalized Anxiety Score 08/09/2018 08/03/2018  Nervous, Anxious, on Edge 2 2  Control/stop worrying 2 2  Worry too much - different things 2 1  Trouble relaxing 3 3  Restless 2 1  Easily annoyed or irritable 3 3  Afraid - awful might happen 2 1  Total GAD 7 Score 16 13

## 2018-08-09 ENCOUNTER — Encounter: Payer: Self-pay | Admitting: Obstetrics and Gynecology

## 2018-08-09 ENCOUNTER — Inpatient Hospital Stay (HOSPITAL_COMMUNITY)
Admission: AD | Admit: 2018-08-09 | Discharge: 2018-08-09 | Disposition: A | Discharge status: Home / Self Care | Payer: Managed Care, Other (non HMO) | Attending: Obstetrics and Gynecology | Admitting: Obstetrics and Gynecology

## 2018-08-09 ENCOUNTER — Ambulatory Visit (INDEPENDENT_AMBULATORY_CARE_PROVIDER_SITE_OTHER): Payer: 59 | Admitting: Clinical

## 2018-08-09 ENCOUNTER — Encounter (HOSPITAL_COMMUNITY): Payer: Self-pay | Admitting: *Deleted

## 2018-08-09 ENCOUNTER — Ambulatory Visit: Payer: Self-pay | Admitting: Obstetrics and Gynecology

## 2018-08-09 VITALS — BP 143/84 | HR 93 | Wt 225.0 lb

## 2018-08-09 DIAGNOSIS — F4323 Adjustment disorder with mixed anxiety and depressed mood: Secondary | ICD-10-CM | POA: Diagnosis not present

## 2018-08-09 DIAGNOSIS — O99612 Diseases of the digestive system complicating pregnancy, second trimester: Secondary | ICD-10-CM | POA: Insufficient documentation

## 2018-08-09 DIAGNOSIS — O219 Vomiting of pregnancy, unspecified: Secondary | ICD-10-CM

## 2018-08-09 DIAGNOSIS — Z3A14 14 weeks gestation of pregnancy: Secondary | ICD-10-CM | POA: Insufficient documentation

## 2018-08-09 DIAGNOSIS — K5900 Constipation, unspecified: Secondary | ICD-10-CM

## 2018-08-09 DIAGNOSIS — Z87891 Personal history of nicotine dependence: Secondary | ICD-10-CM | POA: Diagnosis not present

## 2018-08-09 DIAGNOSIS — O99619 Diseases of the digestive system complicating pregnancy, unspecified trimester: Principal | ICD-10-CM

## 2018-08-09 DIAGNOSIS — O99012 Anemia complicating pregnancy, second trimester: Secondary | ICD-10-CM

## 2018-08-09 DIAGNOSIS — O26892 Other specified pregnancy related conditions, second trimester: Secondary | ICD-10-CM | POA: Diagnosis not present

## 2018-08-09 DIAGNOSIS — O211 Hyperemesis gravidarum with metabolic disturbance: Secondary | ICD-10-CM

## 2018-08-09 DIAGNOSIS — O139 Gestational [pregnancy-induced] hypertension without significant proteinuria, unspecified trimester: Secondary | ICD-10-CM

## 2018-08-09 DIAGNOSIS — R109 Unspecified abdominal pain: Secondary | ICD-10-CM | POA: Insufficient documentation

## 2018-08-09 DIAGNOSIS — Z9884 Bariatric surgery status: Secondary | ICD-10-CM

## 2018-08-09 DIAGNOSIS — Z3480 Encounter for supervision of other normal pregnancy, unspecified trimester: Secondary | ICD-10-CM

## 2018-08-09 DIAGNOSIS — O210 Mild hyperemesis gravidarum: Secondary | ICD-10-CM | POA: Insufficient documentation

## 2018-08-09 DIAGNOSIS — O132 Gestational [pregnancy-induced] hypertension without significant proteinuria, second trimester: Secondary | ICD-10-CM

## 2018-08-09 DIAGNOSIS — O99019 Anemia complicating pregnancy, unspecified trimester: Secondary | ICD-10-CM | POA: Insufficient documentation

## 2018-08-09 DIAGNOSIS — O4690 Antepartum hemorrhage, unspecified, unspecified trimester: Secondary | ICD-10-CM | POA: Insufficient documentation

## 2018-08-09 DIAGNOSIS — O99013 Anemia complicating pregnancy, third trimester: Secondary | ICD-10-CM

## 2018-08-09 DIAGNOSIS — F1210 Cannabis abuse, uncomplicated: Secondary | ICD-10-CM

## 2018-08-09 DIAGNOSIS — O4692 Antepartum hemorrhage, unspecified, second trimester: Secondary | ICD-10-CM

## 2018-08-09 LAB — CBC
HCT: 27.3 % — ABNORMAL LOW (ref 36.0–46.0)
Hemoglobin: 9.4 g/dL — ABNORMAL LOW (ref 12.0–15.0)
MCH: 31.8 pg (ref 26.0–34.0)
MCHC: 34.4 g/dL (ref 30.0–36.0)
MCV: 92.2 fL (ref 80.0–100.0)
Platelets: 266 10*3/uL (ref 150–400)
RBC: 2.96 MIL/uL — ABNORMAL LOW (ref 3.87–5.11)
RDW: 13.2 % (ref 11.5–15.5)
WBC: 11.1 10*3/uL — ABNORMAL HIGH (ref 4.0–10.5)

## 2018-08-09 LAB — URINALYSIS, ROUTINE W REFLEX MICROSCOPIC
Bilirubin Urine: NEGATIVE
Glucose, UA: NEGATIVE mg/dL
KETONES UR: 5 mg/dL — AB
Nitrite: NEGATIVE
Protein, ur: 100 mg/dL — AB
Specific Gravity, Urine: 1.018 (ref 1.005–1.030)
pH: 9 — ABNORMAL HIGH (ref 5.0–8.0)

## 2018-08-09 LAB — BASIC METABOLIC PANEL
Anion gap: 8 (ref 5–15)
BUN: 7 mg/dL (ref 6–20)
CO2: 23 mmol/L (ref 22–32)
Calcium: 8.6 mg/dL — ABNORMAL LOW (ref 8.9–10.3)
Chloride: 103 mmol/L (ref 98–111)
Creatinine, Ser: 0.58 mg/dL (ref 0.44–1.00)
GFR calc Af Amer: >60 mL/min (ref >60)
GFR calc non Af Amer: >60 mL/min (ref >60)
Glucose, Bld: 81 mg/dL (ref 70–99)
Potassium: 3.6 mmol/L (ref 3.5–5.1)
Sodium: 134 mmol/L — ABNORMAL LOW (ref 135–145)

## 2018-08-09 MED ORDER — PROMETHAZINE HCL 25 MG RE SUPP: 25.0000 mg | Freq: Four times a day (QID) | RECTAL | 1 refills | Status: DC | PRN

## 2018-08-09 MED ORDER — PROMETHAZINE HCL 25 MG/ML IJ SOLN
25.0000 mg | Freq: Once | INTRAMUSCULAR | Status: AC
  Administered 2018-08-09: 25 mg via INTRAVENOUS
  Filled 2018-08-09: qty 1

## 2018-08-09 MED ORDER — SCOPOLAMINE 1 MG/3DAYS TD PT72: 1.0000 | MEDICATED_PATCH | TRANSDERMAL | 1 refills | Status: DC

## 2018-08-09 MED ORDER — DICYCLOMINE HCL 20 MG PO TABS
20.0000 mg | ORAL_TABLET | Freq: Once | ORAL | Status: DC
  Filled 2018-08-09: qty 1

## 2018-08-09 MED ORDER — DICYCLOMINE HCL 10 MG PO CAPS
20.0000 mg | ORAL_CAPSULE | Freq: Once | ORAL | Status: AC
  Administered 2018-08-09: 20 mg via ORAL
  Filled 2018-08-09: qty 2

## 2018-08-09 MED ORDER — LACTATED RINGERS IV BOLUS
1000.0000 mL | Freq: Once | INTRAVENOUS | Status: AC
  Administered 2018-08-09: 1000 mL via INTRAVENOUS

## 2018-08-09 MED ORDER — FAMOTIDINE IN NACL 20-0.9 MG/50ML-% IV SOLN
20.0000 mg | Freq: Once | INTRAVENOUS | Status: AC
  Administered 2018-08-09: 20 mg via INTRAVENOUS
  Filled 2018-08-09: qty 50

## 2018-08-09 MED ORDER — SCOPOLAMINE 1 MG/3DAYS TD PT72
1.0000 | MEDICATED_PATCH | Freq: Once | TRANSDERMAL | Status: DC
  Administered 2018-08-09: 1.5 mg via TRANSDERMAL
  Filled 2018-08-09: qty 1

## 2018-08-09 MED ORDER — MAGNESIUM CITRATE PO SOLN: 1.0000 | Freq: Every day | ORAL | 0 refills | Status: AC

## 2018-08-09 MED ORDER — ONDANSETRON HCL 4 MG/2ML IJ SOLN
4.0000 mg | Freq: Once | INTRAMUSCULAR | Status: AC
  Administered 2018-08-09: 4 mg via INTRAVENOUS
  Filled 2018-08-09: qty 2

## 2018-08-09 NOTE — MAU Note (Signed)
Patient was seen in MAU on Monday for constipation and returns today for worsening constipation, abdominal and back pain, and vomiting. Patient has Golytly and states "I throw it up every time I drink a few ounces."

## 2018-08-09 NOTE — Discharge Instructions (Signed)

## 2018-08-09 NOTE — Progress Notes (Signed)
Prenatal Visit Note Date: 08/09/2018 Clinic: [facility]  Subjective:  Tammy Andrews is a 28 y.o. G3P0110 at [redacted]w[redacted]d being seen today for ongoing prenatal care.  She is currently monitored for the following issues for this high-risk pregnancy and has Hyperemesis gravidarum before end of [redacted] week gestation with dehydration; History of fetal demise, not currently pregnant; Subchorionic hemorrhage in second trimester; Supervision of high risk pregnancy, antepartum; Constipation during pregnancy; Back pain affecting pregnancy; Marijuana abuse; H/O bariatric surgery; and Anemia in pregnancy on their problem list.  Patient reports went to MAU this morning and had small liquid BM there but nothing since, and u/s there showed SLIUP.   Contractions: Not present. Vag. Bleeding: Moderate.  Movement: Absent. Denies leaking of fluid.   The following portions of the patient's history were reviewed and updated as appropriate: allergies, current medications, past family history, past medical history, past social history, past surgical history and problem list. Problem list updated.  Objective:   Vitals:   08/09/18 1532 08/09/18 1536  BP: (!) 141/91 (!) 143/84  Pulse: 93   Weight: 225 lb (102.1 kg)     Fetal Status:     Movement: Absent     General:  Alert, oriented and cooperative. Patient is in no acute distress.  Skin: Skin is warm and dry. No rash noted.   Cardiovascular: Normal heart rate noted  Respiratory: Normal respiratory effort, no problems with respiration noted  Abdomen: Soft, gravid, appropriate for gestational age. Pain/Pressure: Present     Pelvic:  Cervical exam deferred        Extremities: Normal range of motion.  Edema: None  Mental Status: Normal mood and affect. Normal behavior. Normal judgment and thought content.   Urinalysis:      Assessment and Plan:  Pregnancy: G3P0110 at [redacted]w[redacted]d  1. Constipation during pregnancy, antepartum Refer to GI. D/w her to  stop THC use as can cause cyclic n/v. I told her to continue with tid miralax and will try mag citrate  - Ambulatory referral to Gastroenterology  2. Marijuana abuse  3. H/O bariatric surgery  4. Anemia during pregnancy in third trimester May need IV iron later in pregnancy if continued GI issues  5. Hyperemesis gravidarum before end of [redacted] week gestation with dehydration Continue with phenergan. Pt able states able to take miralax and some PO. Will need weekly visits to hopefully keep from needing to go to MAU - Ambulatory referral to Gastroenterology  6. Transient HTN Continue to follow. May have cHTN dx.   7. BH Seen by Asher Muir today.   8. IUFD Patient states was in January 2018 in [facility] in [place], [place]. Will try and request records. Pt states was about 24wks and woke up with bleeding (no pain) and went to hospital and dx with IUFD and had IOL.   9. VB in pregnancy No change in plan of care b/c if she has bleeding then not a cerclage candidate and no medications shown to be effective. Will continue with close surveillance.   Preterm labor symptoms and general obstetric precautions including but not limited to vaginal bleeding, contractions, leaking of fluid and fetal movement were reviewed in detail with the patient. Please refer to After Visit Summary for other counseling recommendations.  Return in about 1 week (around 08/16/2018) for hrob.   Alger Bing, MD

## 2018-08-09 NOTE — MAU Provider Note (Signed)
Chief Complaint: Abdominal Pain; Back Pain; Constipation; and Vaginal Bleeding   First Provider Initiated Contact with Patient 08/09/18 0818     SUBJECTIVE HPI: Tammy Andrews is a 28 y.o. G3P0110 at [redacted]w[redacted]d who presents to Maternity Admissions reporting abdominal pain, back pain, constipation, and vaginal bleeding. This has been an ongoing issue with her during this pregnancy. Has been seen in MAU 4 times & was admitted 2 weeks ago for symptoms including HEG & depression.   Was last seen in MAU on Monday for constipation. Had xray that showed large amount of stool in colon. Has been taking dulcolax & golytly without relief but has been unable to keep the golytly down. Last BM was 14 days ago. Flatus has decreased. Last took dulcolax yesterday.   Has continued nausea & vomiting. Stopped taking zofran due to constipation. Has phenergan at home. States she can't eat or drink due to the constipation, n/v, and pain. Is able to tolerate water.   Has a known large [facility]. Has had some dark red bleeding. Not saturating pads or passing clots.  No fever/chills, flank pain, dysuria, upper abdominal pain.    Location: lower abdomen & back Quality: sharp, cramping Severity: 10/10 on pain scale Duration: 3 weeks Timing: constant Modifying factors: nothing makes better or worse Associated signs and symptoms: n/v, constipation, vaginal bleeding  Past Medical History:  Diagnosis Date  . Anxiety   . Asthma    childhood  . Depression   . Preterm labor    OB History  Gravida Para Term Preterm AB Living  3 1 0 1 1 0  SAB TAB Ectopic Multiple Live Births  0 1 0 0 0    # Outcome Date GA Lbr Len/2nd Weight Sex Delivery Anes PTL Lv  3 Current           2 Preterm      Vag-Spont     1 TAB            Past Surgical History:  Procedure Laterality Date  . DILATION AND CURETTAGE OF UTERUS    . LAPAROSCOPIC GASTRIC BAND REMOVAL WITH LAPAROSCOPIC GASTRIC SLEEVE RESECTION  08/2014   Social History    Socioeconomic History  . Marital status: Single    Spouse name: Not on file  . Number of children: Not on file  . Years of education: Not on file  . Highest education level: Not on file  Occupational History  . Not on file  Social Needs  . Financial resource strain: Not on file  . Food insecurity:    Worry: Sometimes true    Inability: Never true  . Transportation needs:    Medical: No    Non-medical: Yes  Tobacco Use  . Smoking status: Former Smoker    Last attempt to quit: 2018    Years since quitting: 2.0  . Smokeless tobacco: Never Used  Substance and Sexual Activity  . Alcohol use: Not Currently    Comment: not in pregnancy  . Drug use: Not Currently    Types: Marijuana    Comment: last used 08-01-18  . Sexual activity: Not Currently    Birth control/protection: None  Lifestyle  . Physical activity:    Days per week: Not on file    Minutes per session: Not on file  . Stress: Not on file  Relationships  . Social connections:    Talks on phone: Not on file    Gets together: Not on file    Attends religious  service: Not on file    Active member of club or organization: Not on file    Attends meetings of clubs or organizations: Not on file    Relationship status: Not on file  . Intimate partner violence:    Fear of current or ex partner: Not on file    Emotionally abused: Not on file    Physically abused: Not on file    Forced sexual activity: Not on file  Other Topics Concern  . Not on file  Social History Narrative   Pt lives in [place] with husband and step-son; no psychiatrist   Family History  Problem Relation Age of Onset  . Hypertension Father   . Diabetes Paternal Grandmother    No current facility-administered medications on file prior to encounter.    Current Outpatient Medications on File Prior to Encounter  Medication Sig Dispense Refill  . amitriptyline (ELAVIL) 50 MG tablet Take 1 tablet (50 mg total) by mouth daily. 20 tablet 1  .  aspirin EC 81 MG tablet Take 1 tablet (81 mg total) by mouth daily. Take after 12 weeks for prevention of preeclampssia later in pregnancy 300 tablet 2  . bisacodyl (DULCOLAX) 10 MG suppository Place 1 suppository (10 mg total) rectally daily as needed for moderate constipation. 12 suppository 1  . escitalopram (LEXAPRO) 20 MG tablet Take 1 tablet (20 mg total) by mouth daily. 30 tablet 1  . methylPREDNISolone (MEDROL) 4 MG tablet 4 tablets at night day one, 2 tablets PO for 3 days, then finish with one tablet PO HS 15 tablet 0  . ondansetron (ZOFRAN ODT) 4 MG disintegrating tablet Take 1 tablet (4 mg total) by mouth every 6 (six) hours as needed for nausea. 20 tablet 0  . Prenatal Vit-Fe Fumarate-FA (PRENATAL MULTIVITAMIN) TABS tablet Take 1 tablet by mouth daily at 12 noon. 30 tablet 1  . promethazine (PHENERGAN) 25 MG tablet Take 25 mg by mouth every 6 (six) hours as needed for nausea or vomiting.     No Known Allergies  I have reviewed patient's Past Medical Hx, Surgical Hx, Family Hx, Social Hx, medications and allergies.   Review of Systems  Constitutional: Positive for appetite change. Negative for chills and fever.  Gastrointestinal: Positive for abdominal pain, constipation, nausea and vomiting. Negative for anal bleeding, diarrhea and rectal pain.  Genitourinary: Positive for vaginal bleeding. Negative for dysuria, flank pain and vaginal discharge.  Musculoskeletal: Positive for back pain.    OBJECTIVE Patient Vitals for the past 24 hrs:  BP Temp Temp src Pulse Resp SpO2  08/09/18 1414 130/75 98.9 F (37.2 C) Oral 91 18 -  08/09/18 0743 (!) 144/87 97.9 F (36.6 C) Oral 100 17 100 %  08/09/18 0742 - - - - - 100 %   Constitutional: Well-developed, well-nourished female in no acute distress.  Cardiovascular: normal rate & rhythm, no murmur Respiratory: normal rate and effort. Lung sounds clear throughout GI: TTP throughout lower abdomen. Soft. No rebound. BS x4 MS: Extremities  nontender, no edema, normal ROM Neurologic: Alert and oriented x 4.  GU:   Cervix closed. Small amount of dark red blood LAB RESULTS Results for orders placed or performed during the hospital encounter of 08/09/18 (from the past 24 hour(s))  Urinalysis, Routine w reflex microscopic     Status: Abnormal   Collection Time: 08/09/18  7:41 AM  Result Value Ref Range   Color, Urine YELLOW YELLOW   APPearance HAZY (A) CLEAR   Specific Gravity, Urine  1.018 1.005 - 1.030   pH 9.0 (H) 5.0 - 8.0   Glucose, UA NEGATIVE NEGATIVE mg/dL   Hgb urine dipstick LARGE (A) NEGATIVE   Bilirubin Urine NEGATIVE NEGATIVE   Ketones, ur 5 (A) NEGATIVE mg/dL   Protein, ur 161 (A) NEGATIVE mg/dL   Nitrite NEGATIVE NEGATIVE   Leukocytes, UA TRACE (A) NEGATIVE   RBC / HPF 21-50 0 - 5 RBC/hpf   WBC, UA 6-10 0 - 5 WBC/hpf   Bacteria, UA RARE (A) NONE SEEN   Squamous Epithelial / LPF 6-10 0 - 5   Mucus PRESENT   CBC     Status: Abnormal   Collection Time: 08/09/18 10:16 AM  Result Value Ref Range   WBC 11.1 (H) 4.0 - 10.5 K/uL   RBC 2.96 (L) 3.87 - 5.11 MIL/uL   Hemoglobin 9.4 (L) 12.0 - 15.0 g/dL   HCT 09.6 (L) 04.5 - 40.9 %   MCV 92.2 80.0 - 100.0 fL   MCH 31.8 26.0 - 34.0 pg   MCHC 34.4 30.0 - 36.0 g/dL   RDW 81.1 91.4 - 78.2 %   Platelets 266 150 - 400 K/uL  Basic metabolic panel     Status: Abnormal   Collection Time: 08/09/18 10:16 AM  Result Value Ref Range   Sodium 134 (L) 135 - 145 mmol/L   Potassium 3.6 3.5 - 5.1 mmol/L   Chloride 103 98 - 111 mmol/L   CO2 23 22 - 32 mmol/L   Glucose, Bld 81 70 - 99 mg/dL   BUN 7 6 - 20 mg/dL   Creatinine, Ser 9.56 0.44 - 1.00 mg/dL   Calcium 8.6 (L) 8.9 - 10.3 mg/dL   GFR calc non Af Amer >60 >60 mL/min   GFR calc Af Amer >60 >60 mL/min   Anion gap 8 5 - 15    IMAGING Pt informed that the ultrasound is considered a limited OB ultrasound and is not intended to be a complete ultrasound exam.  Patient also informed that the ultrasound is not being  completed with the intent of assessing for fetal or placental anomalies or any pelvic abnormalities.  Explained that the purpose of today's ultrasound is to assess for  viability.  Patient acknowledges the purpose of the exam and the limitations of the study.   Live IUP with FHR 160s    MAU COURSE Orders Placed This Encounter  Procedures  . Culture, OB Urine  . Urinalysis, Routine w reflex microscopic  . CBC  . Basic metabolic panel  . Soap suds enema  . Discharge patient   Meds ordered this encounter  Medications  . lactated ringers bolus 1,000 mL  . promethazine (PHENERGAN) injection 25 mg  . scopolamine (TRANSDERM-SCOP) 1 MG/3DAYS 1.5 mg  . DISCONTD: dicyclomine (BENTYL) tablet 20 mg  . dicyclomine (BENTYL) capsule 20 mg  . lactated ringers bolus 1,000 mL  . famotidine (PEPCID) IVPB 20 mg premix  . ondansetron (ZOFRAN) injection 4 mg  . promethazine (PHENERGAN) 25 MG suppository    Sig: Place 1 suppository (25 mg total) rectally every 6 (six) hours as needed for nausea.    Dispense:  12 suppository    Refill:  1    Order Specific Question:   Supervising Provider    Answer:   ERVIN, MICHAEL L [[id number]]  . scopolamine (TRANSDERM-SCOP, 1.5 MG,) 1 MG/3DAYS    Sig: Place 1 patch (1.5 mg total) onto the skin every 3 (three) days for 24 days.    Dispense:  4 patch    Refill:  1    Order Specific Question:   Supervising Provider    Answer:   ERVIN, MICHAEL L [[id number]]    MDM Pt did not tolerate doppler for FHTs. Bedside ultrasound performed & showed live IUP. See note above.   Cervix closed with small amount of bleeding. Pt has known SCH. RH positive.   Soap suds enema with good results. Pt reports minimal improvement in pain. Discussed expectations of pain/discomfort after 2 weeks of constipation. Patient upset, states the pain started before then and won't go away. She reports that she spends most of her day in the bathtub and otherwise has trouble getting up and walking. Nothing  makes the pain better.   CBC, BMP, IV fluids & phenergan ordered. After nausea meds given, will give bentyl.  Pt vomited after bentyl. Ordered pepcid & 4 mg of zofran (will not continue zofran at home).   Pt requesting dilaudid. Discussed reasons not to give dilaudid including constipation. Looked up patient on DEA database for [place] & [place], not found.   Pt agreeable to be discharged so she can keep her appointments in the office today. Rx scopolamine patches & phenergan suppositories. Given written information regarding constipation and bowel regimen.    ASSESSMENT 1. Constipation during pregnancy in second trimester   2. Supervision of other normal pregnancy, antepartum   3. [redacted] weeks gestation of pregnancy   4. Abdominal pain during pregnancy in second trimester   5. Nausea and vomiting during pregnancy prior to [redacted] weeks gestation     PLAN Discharge home in stable condition.   Allergies as of 08/09/2018   No Known Allergies     Medication List    STOP taking these medications   methylPREDNISolone 4 MG tablet Commonly known as:  MEDROL   ondansetron 4 MG disintegrating tablet Commonly known as:  ZOFRAN ODT     TAKE these medications   amitriptyline 50 MG tablet Commonly known as:  ELAVIL Take 1 tablet (50 mg total) by mouth daily.   aspirin EC 81 MG tablet Take 1 tablet (81 mg total) by mouth daily. Take after 12 weeks for prevention of preeclampssia later in pregnancy   bisacodyl 10 MG suppository Commonly known as:  DULCOLAX Place 1 suppository (10 mg total) rectally daily as needed for moderate constipation.   escitalopram 20 MG tablet Commonly known as:  LEXAPRO Take 1 tablet (20 mg total) by mouth daily.   prenatal multivitamin Tabs tablet Take 1 tablet by mouth daily at 12 noon.   promethazine 25 MG tablet Commonly known as:  PHENERGAN Take 25 mg by mouth every 6 (six) hours as needed for nausea or vomiting. What changed:  Another medication with the same  name was added. Make sure you understand how and when to take each.   promethazine 25 MG suppository Commonly known as:  PHENERGAN Place 1 suppository (25 mg total) rectally every 6 (six) hours as needed for nausea. What changed:  You were already taking a medication with the same name, and this prescription was added. Make sure you understand how and when to take each.   scopolamine 1 MG/3DAYS Commonly known as:  TRANSDERM-SCOP (1.5 MG) Place 1 patch (1.5 mg total) onto the skin every 3 (three) days for 24 days.        Judeth Horn, NP 08/09/2018  2:31 PM

## 2018-08-10 ENCOUNTER — Encounter: Payer: Self-pay | Admitting: Physician Assistant

## 2018-08-10 LAB — CULTURE, OB URINE

## 2018-08-15 ENCOUNTER — Ambulatory Visit: Payer: Self-pay | Admitting: Physician Assistant

## 2018-08-15 ENCOUNTER — Inpatient Hospital Stay (HOSPITAL_COMMUNITY)
Admission: AD | Admit: 2018-08-15 | Discharge: 2018-08-15 | Disposition: A | Discharge status: Home / Self Care | Payer: Managed Care, Other (non HMO) | Attending: Obstetrics and Gynecology | Admitting: Obstetrics and Gynecology

## 2018-08-15 ENCOUNTER — Encounter (HOSPITAL_COMMUNITY): Payer: Self-pay

## 2018-08-15 DIAGNOSIS — O26892 Other specified pregnancy related conditions, second trimester: Secondary | ICD-10-CM | POA: Insufficient documentation

## 2018-08-15 DIAGNOSIS — K117 Disturbances of salivary secretion: Secondary | ICD-10-CM

## 2018-08-15 DIAGNOSIS — Z3A14 14 weeks gestation of pregnancy: Secondary | ICD-10-CM

## 2018-08-15 DIAGNOSIS — Z029 Encounter for administrative examinations, unspecified: Secondary | ICD-10-CM

## 2018-08-15 DIAGNOSIS — O99612 Diseases of the digestive system complicating pregnancy, second trimester: Secondary | ICD-10-CM

## 2018-08-15 DIAGNOSIS — R1012 Left upper quadrant pain: Secondary | ICD-10-CM | POA: Diagnosis not present

## 2018-08-15 DIAGNOSIS — Z87891 Personal history of nicotine dependence: Secondary | ICD-10-CM | POA: Diagnosis not present

## 2018-08-15 DIAGNOSIS — K5901 Slow transit constipation: Secondary | ICD-10-CM | POA: Diagnosis not present

## 2018-08-15 DIAGNOSIS — R1032 Left lower quadrant pain: Secondary | ICD-10-CM | POA: Insufficient documentation

## 2018-08-15 LAB — CBC
HEMATOCRIT: 29.6 % — AB (ref 36.0–46.0)
Hemoglobin: 10.1 g/dL — ABNORMAL LOW (ref 12.0–15.0)
MCH: 31.5 pg (ref 26.0–34.0)
MCHC: 34.1 g/dL (ref 30.0–36.0)
MCV: 92.2 fL (ref 80.0–100.0)
Platelets: 240 10*3/uL (ref 150–400)
RBC: 3.21 MIL/uL — ABNORMAL LOW (ref 3.87–5.11)
RDW: 13.8 % (ref 11.5–15.5)
WBC: 13.9 10*3/uL — ABNORMAL HIGH (ref 4.0–10.5)
nRBC: 0 % (ref 0.0–0.2)

## 2018-08-15 LAB — URINALYSIS, ROUTINE W REFLEX MICROSCOPIC
Glucose, UA: NEGATIVE mg/dL
Ketones, ur: 20 mg/dL — AB
Nitrite: NEGATIVE
Protein, ur: >=300 mg/dL — AB
Specific Gravity, Urine: 1.027 (ref 1.005–1.030)
pH: 6 (ref 5.0–8.0)

## 2018-08-15 LAB — WET PREP, GENITAL
Sperm: NONE SEEN
Trich, Wet Prep: NONE SEEN
Yeast Wet Prep HPF POC: NONE SEEN

## 2018-08-15 MED ORDER — ACETAMINOPHEN 500 MG PO TABS
1000.0000 mg | ORAL_TABLET | Freq: Four times a day (QID) | ORAL | Status: DC | PRN
  Administered 2018-08-15: 1000 mg via ORAL
  Filled 2018-08-15: qty 2

## 2018-08-15 MED ORDER — GLYCOPYRROLATE 1 MG PO TABS: 1.0000 mg | ORAL_TABLET | Freq: Three times a day (TID) | ORAL | 0 refills | Status: DC

## 2018-08-15 NOTE — MAU Note (Signed)
Pt presents to MAU with c/o of severe abdominal pain and back pain that she states started 2 months ago, today just more severe. Pt has had vaginal bleeding that is ongoing due to subchorionic hemorrhage diagnosis. Pt has also had ongoing vomiting in this pregnancy.

## 2018-08-15 NOTE — Discharge Instructions (Signed)
Chronic Constipation  Chronic constipation is a condition in which a person has three or fewer bowel movements a week, for three months or longer. This condition is especially common in older adults. The two main kinds of chronic constipation are secondary constipation and functional constipation. Secondary constipation results from another condition or a treatment. Functional constipation, also called primary or idiopathic constipation, is divided into three types:  Normal transit constipation. In this type, movement of stool through the colon (stool transit) occurs normally.  Slow transit constipation. In this type, stool moves slowly through the colon.  Outlet constipation or pelvic floor dysfunction. In this type, the nerves and muscles that empty the rectum do not work normally. What are the causes? Causes of secondary constipation may include:  Failing to drink enough fluid, eat enough food or fiber, or get physically active.  Pregnancy.  A tear in the anus (anal fissure).  Blockage in the bowel (bowel obstruction).  Narrowing of the bowel (bowel stricture).  Having a long-term medical condition, such as: ? Diabetes. ? Hypothyroidism. ? Multiple sclerosis. ? Parkinson disease. ? Stroke. ? Spinal cord injury. ? Dementia. ? Colon cancer. ? Inflammatory bowel disease (IBD). ? Iron-deficiency anemia. ? Outward collapse of the rectum (rectal prolapse). ? Hemorrhoids.  Taking certain medicines, including: ? Narcotics. These are a certain type of prescription pain medicine. ? Antacids. ? Iron supplements. ? Water pills (diuretics). ? Certain blood pressure medicines. ? Anti-seizure medicines. ? Antidepressants. ? Medicines for Parkinson disease. The cause of functional constipation is not known, but some conditions are associated with it. These conditions include:  Stress.  Problems in the nerves and muscles that control stool transit.  Weak or impaired pelvic floor  muscles. What increases the risk? You may be at higher risk for chronic constipation if you:  Are older than age 70.  Are female.  Live in a long-term care facility.  Do not get much exercise or physical activity (have a sedentary lifestyle).  Do not drink enough fluids.  Do not eat enough food, especially fiber.  Have a long-term disease.  Have a mental health disorder or eating disorder.  Take many medicines. What are the signs or symptoms? The main symptom of chronic constipation is having three or fewer bowel movements a week for several weeks. Other signs and symptoms may vary from person to person. These include:  Pushing hard (straining) to pass stool.  Painful bowel movements.  Having hard or lumpy stools.  Having lower belly discomfort, such as cramps or bloating.  Being unable to have a bowel movement when you feel the urge.  Feeling like you still need to pass stool after a bowel movement.  Feeling that you have something in your rectum that is blocking or preventing bowel movements.  Seeing blood on the toilet paper or in your stool.  Worsening confusion (in older adults). How is this diagnosed? This condition may be diagnosed based on:  Symptoms and medical history. You will be asked about your symptoms, lifestyle, diet, and any medicines that you are taking.  Physical exam. ? Your belly (abdomen) will be examined. ? A digital rectal exam may be done. For this exam, a health care provider places a lubricated, gloved finger into the rectum.  Other tests to check for any underlying causes of your constipation. These may be ordered if you have bleeding in your rectum, weight loss, or a family history of colon cancer. In these cases, you may have: ? Imaging studies of   the colon. These may include X-ray, ultrasound, or CT scan. ? Blood tests. ? A procedure to examine the inside of your colon (colonoscopy). ? More specialized tests to check:  Whether  your anal sphincter works well. This is a ring-shaped muscle that controls the closing of the anus.  How well food moves through your colon. ? Tests to measure the nerve signal in your pelvic floor muscles (electromyography). How is this treated? Treatment for chronic constipation depends on the cause. Most often, treatment starts with:  Being more active and getting regular exercise.  Drinking more fluids.  Adding fiber to your diet. Sources of fiber include fruits, vegetables, whole grains, and fiber supplements.  Using medicines such as stool softeners or medicines that increase contractions in your digestive system (pro-motility agents).  Training your pelvic muscles with biofeedback.  Surgery, if there is obstruction. Treatment for secondary chronic constipation depends on the underlying condition. You may need to:  Stop or change some medicines if they cause constipation.  Use a fiber supplement (bulk laxative) or stool softener.  Use prescription laxative. This works by absorbing water into your colon (osmotic laxative). You may also need to see a specialist who treats conditions of the digestive system (gastroenterologist). Follow these instructions at home:   Take over-the-counter and prescription medicines only as told by your health care provider.  If you are taking a laxative, take it as told by your health care provider.  Eat a balanced diet that includes enough fiber. Ask your health care provider to recommend a diet that is right for you.  Drink clear fluids, especially water. Avoid drinking alcohol, caffeine, and soda.  Drink enough fluid to keep your urine pale yellow.  Get some physical activity every day. Ask your health care provider what physical activities are safe for you.  Get colon cancer screenings as told by your health care provider.  Keep all follow-up visits as told by your health care provider. This is important. Contact a health care  provider if:  You are having three or fewer bowel movements a week.  Your stools are hard or lumpy.  You notice blood on the toilet paper or in your stool after you have a bowel movement.  You have unexplained weight loss.  You have rectum (rectal) pain.  You have stool leakage.  You experience nausea or vomiting. Get help right away if:  You have rectal bleeding or you pass blood clots.  You have severe rectal pain.  You have body tissue that pushes out (protrudes) from your anus.  You have severe pain or bloating (distension) in your abdomen.  You have vomiting that you cannot control. Summary  Chronic constipation is a condition in which a person has three or fewer bowel movements a week, for three months or longer.  You may have a higher risk for this condition if you are an older adult, or if you do not drink enough water or get enough physical activity (are sedentary).  Treatment for this condition depends on the cause. Most treatments for chronic constipation include adding fiber to your diet, drinking more fluids, and getting more physical activity. You may also need to treat any underlying medical conditions or stop or change certain medicines if they cause constipation.  If lifestyle changes do not relieve constipation, your health care provider may recommend taking a laxative. This information is not intended to replace advice given to you by your health care provider. Make sure you discuss any questions you   have with your health care provider. Document Released: 01/31/2017 Document Revised: 03/21/2017 Document Reviewed: 03/21/2017 Elsevier Interactive Patient Education  2019 [employer]  

## 2018-08-15 NOTE — MAU Provider Note (Signed)
History     CSN: [id number]  Arrival date and time: 08/15/18 [id number]   First Provider Initiated Contact with Patient 08/15/18 0809      Chief Complaint  Patient presents with  . Back Pain  . Abdominal Pain   G3P0110 @14 .6 wks presenting for abdominal and back pain. Reports pain onset 2 mos ago but worse over last few days. Abdominal pain is located LUQ, LLQ, and SP. Unable to describe consistency. Pain is intermittent. Rates 10/10. Took Tylenol about a week ago. Denies urinary sx. Having ongoing N/V but is improved over the last 2 days with Phenergan and Scopolamine, but is having excess salivation and spitting. Reports last BM was on 1/23 when she was here for constipation and had 3 enemas. She was sent home with Miralax but only took it for 1 day. Admits to marijuana use 4 days ago, and states "it's the only way I can eat". Back pain is central from mid back to lower. No recent injury or strenuous activity. Rates pain 10/10. States "I can't get out of the (bath)tub, it's the only thing that helps my back". Reports intermittent VB for weeks, amount varies, and [facility] Denies vaginal discharge, itching, or odor.   OB History    Gravida  3   Para  1   Term  0   Preterm  1   AB  1   Living  0     SAB  0   TAB  1   Ectopic  0   Multiple  0   Live Births  0        Obstetric Comments  G2: woke with bleeding and dx with IUFD when went to hospital. IOL SVD        Past Medical History:  Diagnosis Date  . Anxiety   . Asthma    childhood  . Depression   . Preterm labor     Past Surgical History:  Procedure Laterality Date  . DILATION AND CURETTAGE OF UTERUS    . LAPAROSCOPIC GASTRIC SLEEVE RESECTION  08/2014    Family History  Problem Relation Age of Onset  . Hypertension Father   . Diabetes Paternal Grandmother     Social History   Tobacco Use  . Smoking status: Former Smoker    Last attempt to quit: 2018    Years since quitting: 2.0  .  Smokeless tobacco: Never Used  Substance Use Topics  . Alcohol use: Not Currently    Comment: not in pregnancy  . Drug use: Not Currently    Types: Marijuana    Comment: last used 08-01-18    Allergies: No Known Allergies  Medications Prior to Admission  Medication Sig Dispense Refill Last Dose  . acetaminophen (TYLENOL) 500 MG tablet Take 1,000 mg by mouth every 6 (six) hours as needed for mild pain.   08/15/2018 at Unknown time  . promethazine (PHENERGAN) 25 MG tablet Take 25 mg by mouth every 6 (six) hours as needed for nausea or vomiting.   Past Week at Unknown time  . scopolamine (TRANSDERM-SCOP, 1.5 MG,) 1 MG/3DAYS Place 1 patch (1.5 mg total) onto the skin every 3 (three) days for 24 days. 4 patch 1 Past Week at Unknown time  . amitriptyline (ELAVIL) 50 MG tablet Take 1 tablet (50 mg total) by mouth daily. (Patient not taking: Reported on 08/09/2018) 20 tablet 1 Not Taking at Unknown time  . aspirin EC 81 MG tablet Take 1 tablet (81 mg  total) by mouth daily. Take after 12 weeks for prevention of preeclampssia later in pregnancy (Patient not taking: Reported on 08/09/2018) 300 tablet 2 Not Taking at Unknown time  . bisacodyl (DULCOLAX) 10 MG suppository Place 1 suppository (10 mg total) rectally daily as needed for moderate constipation. (Patient not taking: Reported on 08/09/2018) 12 suppository 1 Not Taking at Unknown time  . escitalopram (LEXAPRO) 20 MG tablet Take 1 tablet (20 mg total) by mouth daily. (Patient not taking: Reported on 08/09/2018) 30 tablet 1 Not Taking at Unknown time  . Prenatal Vit-Fe Fumarate-FA (PRENATAL MULTIVITAMIN) TABS tablet Take 1 tablet by mouth daily at 12 noon. (Patient not taking: Reported on 08/09/2018) 30 tablet 1 Not Taking at Unknown time  . promethazine (PHENERGAN) 25 MG suppository Place 1 suppository (25 mg total) rectally every 6 (six) hours as needed for nausea. (Patient not taking: Reported on 08/09/2018) 12 suppository 1 Not Taking at Unknown time     Review of Systems  Constitutional: Negative for chills and fever.  Gastrointestinal: Positive for abdominal pain, constipation, nausea and vomiting.  Genitourinary: Positive for vaginal bleeding. Negative for dysuria, frequency, urgency and vaginal discharge.   Physical Exam   Blood pressure 127/73, pulse 92, temperature 98.2 F (36.8 C), temperature source Oral, resp. rate 18, height 5\' 6"  (1.676 m), weight 97.1 kg, last menstrual period 04/19/2018, unknown if currently breastfeeding.  Physical Exam  Nursing note and vitals reviewed. Constitutional: She is oriented to person, place, and time. She appears well-developed and well-nourished. No distress (appears comfortable).  HENT:  Head: Normocephalic and atraumatic.  Neck: Normal range of motion.  Respiratory: Effort normal. No respiratory distress.  GI: Soft. She exhibits no distension and no mass. There is no abdominal tenderness. There is no rebound, no guarding and no CVA tenderness.  Genitourinary:    Genitourinary Comments: External: no lesions or erythema Vagina: rugated, pink, moist, scant brown bloody discharge Cervix closed/long    Musculoskeletal: Normal range of motion.     Cervical back: Normal.     Thoracic back: Normal.     Lumbar back: Normal.  Neurological: She is alert and oriented to person, place, and time.  Skin: Skin is warm and dry.  Psychiatric: She has a normal mood and affect.  FHT: 150  Results for orders placed or performed during the hospital encounter of 08/15/18 (from the past 24 hour(s))  Urinalysis, Routine w reflex microscopic     Status: Abnormal   Collection Time: 08/15/18  7:54 AM  Result Value Ref Range   Color, Urine AMBER (A) YELLOW   APPearance CLOUDY (A) CLEAR   Specific Gravity, Urine 1.027 1.005 - 1.030   pH 6.0 5.0 - 8.0   Glucose, UA NEGATIVE NEGATIVE mg/dL   Hgb urine dipstick LARGE (A) NEGATIVE   Bilirubin Urine SMALL (A) NEGATIVE   Ketones, ur 20 (A) NEGATIVE mg/dL    Protein, ur >=119>=300 (A) NEGATIVE mg/dL   Nitrite NEGATIVE NEGATIVE   Leukocytes, UA SMALL (A) NEGATIVE   RBC / HPF 11-20 0 - 5 RBC/hpf   WBC, UA 21-50 0 - 5 WBC/hpf   Bacteria, UA RARE (A) NONE SEEN   Squamous Epithelial / LPF 21-50 0 - 5   Mucus PRESENT   Wet prep, genital     Status: Abnormal   Collection Time: 08/15/18  8:25 AM  Result Value Ref Range   Yeast Wet Prep HPF POC NONE SEEN NONE SEEN   Trich, Wet Prep NONE SEEN NONE SEEN  Clue Cells Wet Prep HPF POC PRESENT (A) NONE SEEN   WBC, Wet Prep HPF POC FEW (A) NONE SEEN   Sperm NONE SEEN   CBC     Status: Abnormal   Collection Time: 08/15/18  8:52 AM  Result Value Ref Range   WBC 13.9 (H) 4.0 - 10.5 K/uL   RBC 3.21 (L) 3.87 - 5.11 MIL/uL   Hemoglobin 10.1 (L) 12.0 - 15.0 g/dL   HCT 16.1 (L) 09.6 - 04.5 %   MCV 92.2 80.0 - 100.0 fL   MCH 31.5 26.0 - 34.0 pg   MCHC 34.1 30.0 - 36.0 g/dL   RDW 40.9 81.1 - 91.4 %   Platelets 240 150 - 400 K/uL   nRBC 0.0 0.0 - 0.2 %   MAU Course  Procedures Orders Placed This Encounter  Procedures  . Wet prep, genital    Standing Status:   Standing    Number of Occurrences:   1    Order Specific Question:   Patient immune status    Answer:   Normal  . Culture, OB Urine    Standing Status:   Standing    Number of Occurrences:   1  . Urinalysis, Routine w reflex microscopic    Standing Status:   Standing    Number of Occurrences:   1  . CBC    Standing Status:   Standing    Number of Occurrences:   1  . Discharge patient    Order Specific Question:   Discharge disposition    Answer:   01-Home or Self Care [1]    Order Specific Question:   Discharge patient date    Answer:   08/15/2018   Meds ordered this encounter  Medications  . acetaminophen (TYLENOL) tablet 1,000 mg  . glycopyrrolate (ROBINUL) 1 MG tablet    Sig: Take 1 tablet (1 mg total) by mouth 3 (three) times daily.    Dispense:  90 tablet    Refill:  0    Order Specific Question:   Supervising Provider    Answer:    CONSTANT, PEGGY [[id number]]   MDM Chart review: this is her 9th MAU visit this pregnancy, has been treated for cannabis hyperemesis, VB with known SCH, and constipation. UA contains WBCs and Hgb but contaminated, will send culture. No evidence of acute abdominal process or pyelo. Pain likely d/t chronic constipation. She needs to use a daily bowel regimen, recommend Miralax bid until BM then daily. Will Rx Robinul for spitting. Advised against marijuana use as this may be contributing to abd pain and N/V. Stable for discharge home.   Assessment and Plan   1. [redacted] weeks gestation of pregnancy   2. Slow transit constipation   3. Ptyalism    Discharge home Follow up in [facility] as scheduled Rx Robinul  Allergies as of 08/15/2018   No Known Allergies     Medication List    TAKE these medications   acetaminophen 500 MG tablet Commonly known as:  TYLENOL Take 1,000 mg by mouth every 6 (six) hours as needed for mild pain.   amitriptyline 50 MG tablet Commonly known as:  ELAVIL Take 1 tablet (50 mg total) by mouth daily.   aspirin EC 81 MG tablet Take 1 tablet (81 mg total) by mouth daily. Take after 12 weeks for prevention of preeclampssia later in pregnancy   bisacodyl 10 MG suppository Commonly known as:  DULCOLAX Place 1 suppository (10 mg total) rectally daily as  needed for moderate constipation.   escitalopram 20 MG tablet Commonly known as:  LEXAPRO Take 1 tablet (20 mg total) by mouth daily.   glycopyrrolate 1 MG tablet Commonly known as:  ROBINUL Take 1 tablet (1 mg total) by mouth 3 (three) times daily.   prenatal multivitamin Tabs tablet Take 1 tablet by mouth daily at 12 noon.   promethazine 25 MG tablet Commonly known as:  PHENERGAN Take 25 mg by mouth every 6 (six) hours as needed for nausea or vomiting.   promethazine 25 MG suppository Commonly known as:  PHENERGAN Place 1 suppository (25 mg total) rectally every 6 (six) hours as needed for nausea.   scopolamine 1  MG/3DAYS Commonly known as:  TRANSDERM-SCOP (1.5 MG) Place 1 patch (1.5 mg total) onto the skin every 3 (three) days for 24 days.      Donette Larry, CNM 08/15/2018, 9:33 AM

## 2018-08-16 LAB — CULTURE, OB URINE

## 2018-08-16 LAB — GC/CHLAMYDIA PROBE AMP (~~LOC~~) NOT AT ARMC
Chlamydia: NEGATIVE
Neisseria Gonorrhea: NEGATIVE

## 2018-08-17 ENCOUNTER — Encounter (HOSPITAL_COMMUNITY): Payer: Self-pay | Admitting: *Deleted

## 2018-08-17 ENCOUNTER — Other Ambulatory Visit: Payer: Self-pay

## 2018-08-17 ENCOUNTER — Ambulatory Visit (INDEPENDENT_AMBULATORY_CARE_PROVIDER_SITE_OTHER): Payer: Managed Care, Other (non HMO) | Admitting: Obstetrics and Gynecology

## 2018-08-17 ENCOUNTER — Inpatient Hospital Stay (HOSPITAL_COMMUNITY)
Admission: AD | Admit: 2018-08-17 | Discharge: 2018-08-23 | DRG: 832 | Disposition: A | Discharge status: Home / Self Care | Payer: Managed Care, Other (non HMO) | Attending: Obstetrics and Gynecology | Admitting: Obstetrics and Gynecology

## 2018-08-17 VITALS — BP 131/84 | HR 96 | Temp 98.7°F | Wt 213.0 lb

## 2018-08-17 DIAGNOSIS — Z3A15 15 weeks gestation of pregnancy: Secondary | ICD-10-CM

## 2018-08-17 DIAGNOSIS — K5900 Constipation, unspecified: Secondary | ICD-10-CM | POA: Diagnosis present

## 2018-08-17 DIAGNOSIS — O210 Mild hyperemesis gravidarum: Principal | ICD-10-CM | POA: Diagnosis present

## 2018-08-17 DIAGNOSIS — O99012 Anemia complicating pregnancy, second trimester: Secondary | ICD-10-CM | POA: Diagnosis present

## 2018-08-17 DIAGNOSIS — O209 Hemorrhage in early pregnancy, unspecified: Secondary | ICD-10-CM | POA: Diagnosis present

## 2018-08-17 DIAGNOSIS — O99512 Diseases of the respiratory system complicating pregnancy, second trimester: Secondary | ICD-10-CM | POA: Diagnosis present

## 2018-08-17 DIAGNOSIS — O10012 Pre-existing essential hypertension complicating pregnancy, second trimester: Secondary | ICD-10-CM | POA: Diagnosis present

## 2018-08-17 DIAGNOSIS — O211 Hyperemesis gravidarum with metabolic disturbance: Secondary | ICD-10-CM

## 2018-08-17 DIAGNOSIS — R109 Unspecified abdominal pain: Secondary | ICD-10-CM | POA: Diagnosis not present

## 2018-08-17 DIAGNOSIS — R748 Abnormal levels of other serum enzymes: Secondary | ICD-10-CM

## 2018-08-17 DIAGNOSIS — O99612 Diseases of the digestive system complicating pregnancy, second trimester: Secondary | ICD-10-CM | POA: Diagnosis not present

## 2018-08-17 DIAGNOSIS — Z87891 Personal history of nicotine dependence: Secondary | ICD-10-CM | POA: Diagnosis not present

## 2018-08-17 DIAGNOSIS — O0992 Supervision of high risk pregnancy, unspecified, second trimester: Secondary | ICD-10-CM

## 2018-08-17 DIAGNOSIS — O0990 Supervision of high risk pregnancy, unspecified, unspecified trimester: Secondary | ICD-10-CM

## 2018-08-17 DIAGNOSIS — F1210 Cannabis abuse, uncomplicated: Secondary | ICD-10-CM

## 2018-08-17 DIAGNOSIS — O99322 Drug use complicating pregnancy, second trimester: Secondary | ICD-10-CM | POA: Diagnosis present

## 2018-08-17 DIAGNOSIS — O219 Vomiting of pregnancy, unspecified: Secondary | ICD-10-CM

## 2018-08-17 DIAGNOSIS — D649 Anemia, unspecified: Secondary | ICD-10-CM | POA: Diagnosis present

## 2018-08-17 DIAGNOSIS — M545 Low back pain: Secondary | ICD-10-CM | POA: Diagnosis present

## 2018-08-17 DIAGNOSIS — O99842 Bariatric surgery status complicating pregnancy, second trimester: Secondary | ICD-10-CM | POA: Diagnosis present

## 2018-08-17 DIAGNOSIS — O99611 Diseases of the digestive system complicating pregnancy, first trimester: Secondary | ICD-10-CM | POA: Diagnosis not present

## 2018-08-17 DIAGNOSIS — O4690 Antepartum hemorrhage, unspecified, unspecified trimester: Secondary | ICD-10-CM

## 2018-08-17 DIAGNOSIS — O26892 Other specified pregnancy related conditions, second trimester: Secondary | ICD-10-CM | POA: Diagnosis present

## 2018-08-17 DIAGNOSIS — R1032 Left lower quadrant pain: Secondary | ICD-10-CM | POA: Diagnosis present

## 2018-08-17 DIAGNOSIS — O9989 Other specified diseases and conditions complicating pregnancy, childbirth and the puerperium: Secondary | ICD-10-CM | POA: Diagnosis not present

## 2018-08-17 DIAGNOSIS — O99619 Diseases of the digestive system complicating pregnancy, unspecified trimester: Secondary | ICD-10-CM

## 2018-08-17 DIAGNOSIS — O4692 Antepartum hemorrhage, unspecified, second trimester: Secondary | ICD-10-CM

## 2018-08-17 LAB — TYPE AND SCREEN
ABO/RH(D): O POS
Antibody Screen: NEGATIVE

## 2018-08-17 LAB — PROTEIN / CREATININE RATIO, URINE
Creatinine, Urine: 556 mg/dL
Protein Creatinine Ratio: 0.32 mg/mg{Cre} — ABNORMAL HIGH (ref 0.00–0.15)
Total Protein, Urine: 178 mg/dL

## 2018-08-17 LAB — POCT URINALYSIS DIP (DEVICE)
Glucose, UA: NEGATIVE mg/dL
Ketones, ur: 40 mg/dL — AB
Leukocytes, UA: NEGATIVE
Nitrite: POSITIVE — AB
Protein, ur: >=300 mg/dL — AB
Specific Gravity, Urine: >=1.03 (ref 1.005–1.030)
Urobilinogen, UA: 2 mg/dL — ABNORMAL HIGH (ref 0.0–1.0)
pH: 6 (ref 5.0–8.0)

## 2018-08-17 LAB — TSH: TSH: 0.474 u[IU]/mL (ref 0.350–4.500)

## 2018-08-17 MED ORDER — METHYLPREDNISOLONE 16 MG PO TABS
16.0000 mg | ORAL_TABLET | Freq: Every day | ORAL | Status: AC
  Administered 2018-08-18 – 2018-08-20 (×2): 16 mg via ORAL
  Filled 2018-08-17 (×3): qty 1

## 2018-08-17 MED ORDER — METHYLPREDNISOLONE 16 MG PO TABS
16.0000 mg | ORAL_TABLET | Freq: Every day | ORAL | Status: AC
  Administered 2018-08-20 – 2018-08-21 (×2): 16 mg via ORAL
  Filled 2018-08-17 (×4): qty 1

## 2018-08-17 MED ORDER — METHYLPREDNISOLONE 4 MG PO TABS
8.0000 mg | ORAL_TABLET | Freq: Every day | ORAL | Status: DC
  Administered 2018-08-22 – 2018-08-23 (×2): 8 mg via ORAL
  Filled 2018-08-17 (×2): qty 2

## 2018-08-17 MED ORDER — METHYLPREDNISOLONE 4 MG PO TABS: 4.0000 mg | ORAL_TABLET | Freq: Every day | ORAL | Status: DC

## 2018-08-17 MED ORDER — POLYETHYLENE GLYCOL 3350 17 GM/SCOOP PO POWD
1.0000 | Freq: Once | ORAL | Status: DC
  Filled 2018-08-17: qty 255

## 2018-08-17 MED ORDER — PRENATAL MULTIVITAMIN CH
1.0000 | ORAL_TABLET | Freq: Every day | ORAL | Status: DC
  Administered 2018-08-18 – 2018-08-21 (×2): 1 via ORAL
  Filled 2018-08-17 (×3): qty 1

## 2018-08-17 MED ORDER — METHYLPREDNISOLONE 4 MG PO TABS
8.0000 mg | ORAL_TABLET | Freq: Every day | ORAL | Status: AC
  Administered 2018-08-20 – 2018-08-22 (×3): 8 mg via ORAL
  Filled 2018-08-17 (×3): qty 2

## 2018-08-17 MED ORDER — METHYLPREDNISOLONE 4 MG PO TABS
4.0000 mg | ORAL_TABLET | Freq: Every day | ORAL | Status: DC
  Administered 2018-08-22: 4 mg via ORAL

## 2018-08-17 MED ORDER — PYRIDOXINE HCL 100 MG/ML IJ SOLN
100.0000 mg | Freq: Every day | INTRAMUSCULAR | Status: DC
  Administered 2018-08-17 – 2018-08-21 (×5): 100 mg via INTRAVENOUS
  Filled 2018-08-17 (×6): qty 1

## 2018-08-17 MED ORDER — CALCIUM CARBONATE ANTACID 500 MG PO CHEW: 2.0000 | CHEWABLE_TABLET | ORAL | Status: DC | PRN

## 2018-08-17 MED ORDER — SODIUM CHLORIDE 0.9 % IV SOLN
25.0000 mg | INTRAVENOUS | Status: DC
  Administered 2018-08-17 – 2018-08-22 (×14): 25 mg via INTRAVENOUS
  Filled 2018-08-17 (×15): qty 1

## 2018-08-17 MED ORDER — ACETAMINOPHEN 325 MG PO TABS
650.0000 mg | ORAL_TABLET | ORAL | Status: DC | PRN
  Administered 2018-08-17 – 2018-08-20 (×4): 650 mg via ORAL
  Filled 2018-08-17 (×6): qty 2

## 2018-08-17 MED ORDER — METHYLPREDNISOLONE SODIUM SUCC 125 MG IJ SOLR
48.0000 mg | Freq: Once | INTRAMUSCULAR | Status: AC
  Administered 2018-08-17: 48 mg via INTRAVENOUS
  Filled 2018-08-17: qty 0.77

## 2018-08-17 MED ORDER — METHYLPREDNISOLONE 16 MG PO TABS
16.0000 mg | ORAL_TABLET | Freq: Every day | ORAL | Status: AC
  Filled 2018-08-17 (×2): qty 1

## 2018-08-17 MED ORDER — METHYLPREDNISOLONE 4 MG PO TABS
8.0000 mg | ORAL_TABLET | Freq: Every day | ORAL | Status: DC
  Administered 2018-08-21 – 2018-08-22 (×2): 8 mg via ORAL
  Filled 2018-08-17 (×2): qty 2

## 2018-08-17 MED ORDER — METHYLPREDNISOLONE 4 MG PO TABS
4.0000 mg | ORAL_TABLET | Freq: Every day | ORAL | Status: DC
  Filled 2018-08-17: qty 1

## 2018-08-17 MED ORDER — DOCUSATE SODIUM 100 MG PO CAPS: 100.0000 mg | ORAL_CAPSULE | Freq: Every day | ORAL | Status: DC

## 2018-08-17 MED ORDER — METOCLOPRAMIDE HCL 5 MG/ML IJ SOLN
10.0000 mg | Freq: Four times a day (QID) | INTRAMUSCULAR | Status: DC | PRN
  Administered 2018-08-18 – 2018-08-22 (×4): 10 mg via INTRAVENOUS
  Filled 2018-08-17 (×4): qty 2

## 2018-08-17 NOTE — H&P (Signed)
Tammy Andrews is a 28 y.o. female G3P0110 at [redacted]w[redacted]d presenting for hyperemesis gravidarum. Patient reports 7lb weight loss since her last admission. She reports taking phenergan, scopolamine patch and tylenol without improvement in her symptoms. She reports severe constipation since 07/25/2018.  Patient with prenatal care at [facility] complicated by [facility] currently not on medication and history of a 24-week IUFD  OB History    Gravida  3   Para  1   Term  0   Preterm  1   AB  1   Living  0     SAB  0   TAB  1   Ectopic  0   Multiple  0   Live Births  0        Obstetric Comments  G2: woke with bleeding and dx with IUFD when went to hospital. IOL SVD       Past Medical History:  Diagnosis Date  . Anxiety   . Asthma    childhood  . Depression   . Preterm labor    Past Surgical History:  Procedure Laterality Date  . DILATION AND CURETTAGE OF UTERUS    . LAPAROSCOPIC GASTRIC SLEEVE RESECTION  08/2014   Family History: family history includes Diabetes in her paternal grandmother; Hypertension in her father. Social History:  reports that she quit smoking about 2 years ago. She has never used smokeless tobacco. She reports previous alcohol use. She reports previous drug use. Drug: Marijuana.     Maternal Diabetes: No Genetic Screening: pending Maternal Substance Abuse:  THC Significant Maternal Medications:  None Significant Maternal Lab Results:  None Other Comments:  None  ROS  See pertinent in HPI History   Blood pressure 130/90, pulse 99, temperature 98.4 F (36.9 C), temperature source Oral, resp. rate 20, height 5\' 6"  (1.676 m), weight 96.6 kg, last menstrual period 04/19/2018, SpO2 99 %, unknown if currently breastfeeding. Exam Physical Exam  GENERAL: Well-developed, well-nourished female in no acute distress.  LUNGS: Clear to auscultation bilaterally.  HEART: Regular rate and rhythm. ABDOMEN: Soft, nontender, nondistended.  PELVIC: Not  performed EXTREMITIES: No cyanosis, clubbing, or edema, 2+ distal pulses.  Prenatal labs: ABO, Rh: O/Positive/-- (01/17 1206) Antibody: Negative (01/17 1206) Rubella: 17.90 (01/17 1206) RPR: Non Reactive (01/17 1206)  HBsAg: Negative (01/17 1206)  HIV: Non Reactive (01/17 1206)  GBS:     Assessment/Plan: 28 yo G3P0110 at [redacted]w[redacted]d with hyperemesis gravidarum - Admit to antepartum - IV hydration - IV antiemetics - Medrol taper - Will check TSH - Continue monitoring BP - Will keep NPO for now   Appolonia Ackert 08/17/2018, 1:36 PM

## 2018-08-17 NOTE — Progress Notes (Signed)
Large subchorionic hemorrhage

## 2018-08-17 NOTE — Progress Notes (Signed)
Pharmacy Consult:   MEDROL (METHYLPREDNISOLONE) TAPER  FOR HYPEREMESIS GRAVIDARUM PATIENTS  The following is a 14 day taper of methylprednisolone for hyperemesis. Doses on day 1  will be given IV.  All doses starting on day 2  will be given PO. (If patient cannot tolerate oral medications, contact the pharmacy to change route to IV.)   Date Day Morning Midday Bedtime  1/31 1  48 mg   2/1 2 16  mg 16 mg 16 mg  2/2 3 16  mg 16 mg 16 mg  2/3 4 16  mg 8 mg 16 mg  2/4 5 16  mg 8 mg 8 mg  2/5 6 8  mg 8 mg 8 mg  2/6 7 8  mg 4 mg 8 mg  2/7 8 8  mg 4 mg 4 mg  2/8 9 8  mg 4 mg   2/9 10 8  mg 4 mg   2/10 11 8  mg    2/11 12 8  mg    2/12 13 4  mg    2/13 14 4  mg     Check fasting blood sugars daily while on the taper. Notify MD if fasting blood sugar>95.  Natasha Bence 08/17/2018

## 2018-08-17 NOTE — Progress Notes (Signed)
Subjective:  Tammy Andrews is a 28 y.o. G3P0110 at [redacted]w[redacted]d being seen today for ongoing prenatal care.  She is currently monitored for the following issues for this high-risk pregnancy and has Hyperemesis gravidarum before end of [redacted] week gestation with dehydration; History of fetal demise, not currently pregnant; Subchorionic hemorrhage in second trimester; Supervision of high risk pregnancy, antepartum; Constipation during pregnancy; Back pain affecting pregnancy; Marijuana abuse; H/O bariatric surgery; Anemia in pregnancy; Transient hypertension of pregnancy; and Vaginal bleeding during pregnancy on their problem list.  Patient reports N/V and unable to keep anything down. Constipation, last BM 08/09/18.  Contractions: Not present. Vag. Bleeding: Moderate.  Movement: Absent. Denies leaking of fluid.   The following portions of the patient's history were reviewed and updated as appropriate: allergies, current medications, past family history, past medical history, past social history, past surgical history and problem list. Problem list updated.  Objective:   Vitals:   08/17/18 0914  BP: 131/84  Pulse: 96  Temp: 98.7 F (37.1 C)  Weight: 213 lb (96.6 kg)    Fetal Status: Fetal Heart Rate (bpm): 147   Movement: Absent     General:  Alert, oriented and cooperative. Patient is in no acute distress.  Skin: Skin is warm and dry. No rash noted.   Cardiovascular: Normal heart rate noted  Respiratory: Normal respiratory effort, no problems with respiration noted  Abdomen: Soft, gravid, appropriate for gestational age. Pain/Pressure: Present     Pelvic:  Cervical exam deferred        Extremities: Normal range of motion.  Edema: None  Mental Status: Normal mood and affect. Normal behavior. Normal judgment and thought content.   Urinalysis:      Assessment and Plan:  Pregnancy: G3P0110 at [redacted]w[redacted]d Hyperemesis  D/T continued wt loss, will admit pt to Antennal unit for additional eval and  hydration.   There are no diagnoses linked to this encounter. Preterm labor symptoms and general obstetric precautions including but not limited to vaginal bleeding, contractions, leaking of fluid and fetal movement were reviewed in detail with the patient. Please refer to After Visit Summary for other counseling recommendations.  Return in about 2 weeks (around 08/31/2018) for OB visit.   Hermina Staggers, MD

## 2018-08-18 DIAGNOSIS — O219 Vomiting of pregnancy, unspecified: Secondary | ICD-10-CM

## 2018-08-18 DIAGNOSIS — K5900 Constipation, unspecified: Secondary | ICD-10-CM

## 2018-08-18 DIAGNOSIS — R109 Unspecified abdominal pain: Secondary | ICD-10-CM

## 2018-08-18 DIAGNOSIS — O9989 Other specified diseases and conditions complicating pregnancy, childbirth and the puerperium: Secondary | ICD-10-CM

## 2018-08-18 DIAGNOSIS — O211 Hyperemesis gravidarum with metabolic disturbance: Secondary | ICD-10-CM

## 2018-08-18 DIAGNOSIS — O99611 Diseases of the digestive system complicating pregnancy, first trimester: Secondary | ICD-10-CM

## 2018-08-18 LAB — GLUCOSE, CAPILLARY: Glucose-Capillary: 72 mg/dL (ref 70–99)

## 2018-08-18 MED ORDER — KETOROLAC TROMETHAMINE 30 MG/ML IJ SOLN
30.0000 mg | Freq: Four times a day (QID) | INTRAMUSCULAR | Status: AC
  Administered 2018-08-18 (×4): 30 mg via INTRAVENOUS
  Filled 2018-08-18 (×4): qty 1

## 2018-08-18 MED ORDER — MAGNESIUM HYDROXIDE 400 MG/5ML PO SUSP
30.0000 mL | Freq: Once | ORAL | Status: AC
  Administered 2018-08-18: 30 mL via ORAL
  Filled 2018-08-18: qty 30

## 2018-08-18 MED ORDER — METHYLPREDNISOLONE SODIUM SUCC 40 MG IJ SOLR
16.0000 mg | Freq: Once | INTRAMUSCULAR | Status: AC
  Administered 2018-08-18: 16 mg via INTRAVENOUS
  Filled 2018-08-18: qty 0.4

## 2018-08-18 MED ORDER — ZOLPIDEM TARTRATE 5 MG PO TABS
5.0000 mg | ORAL_TABLET | Freq: Every evening | ORAL | Status: DC | PRN
  Administered 2018-08-18: 5 mg via ORAL
  Filled 2018-08-18: qty 1

## 2018-08-18 MED ORDER — CYCLOBENZAPRINE HCL 10 MG PO TABS
10.0000 mg | ORAL_TABLET | Freq: Three times a day (TID) | ORAL | Status: DC | PRN
  Administered 2018-08-18 – 2018-08-22 (×9): 10 mg via ORAL
  Filled 2018-08-18 (×11): qty 1

## 2018-08-18 NOTE — Plan of Care (Signed)
Pts. Condition will continue to improve 

## 2018-08-18 NOTE — Progress Notes (Signed)
Patient ID: Tammy Andrews, female   DOB: [birth year], 28 y.o.   MRN: [id number]   Subjective: Interval History:Still with nausea, wants to try clears today. She notes increasing back and LLQ pain. Has had some success with Enema, wants to try this again.  Objective: Vital signs in last 24 hours: Temp:  [98 F (36.7 C)-99 F (37.2 C)] 98.8 F (37.1 C) (02/01 0741) Pulse Rate:  [92-99] 92 (02/01 0741) Resp:  [17-20] 18 (02/01 0741) BP: (107-158)/(66-98) 127/67 (02/01 0741) SpO2:  [99 %-100 %] 100 % (02/01 0741) Weight:  [96.6 kg] 96.6 kg (01/31 1312)  Intake/Output from previous day: 01/31 0701 - 02/01 0700 In: 120 [P.O.:120] Out: 175 [Urine:175] Intake/Output this shift: No intake/output data recorded.  General appearance: alert, cooperative, appears stated age and resting without a lot of movement Back: lower back discomfort without true tenderness Lungs: normal effort Heart: regular rate and rhythm Abdomen: soft, LLQ tenderness, no rebound or guarding  Results for orders placed or performed during the hospital encounter of 08/17/18 (from the past 24 hour(s))  Type and screen [facility] OF      Status: None   Collection Time: 08/17/18  2:12 PM  Result Value Ref Range   ABO/RH(D) O POS    Antibody Screen NEG    Sample Expiration      08/20/2018 Performed at [facility], [address] [place], [place] [address]   TSH     Status: None   Collection Time: 08/17/18  2:12 PM  Result Value Ref Range   TSH 0.474 0.350 - 4.500 uIU/mL  Protein / creatinine ratio, urine     Status: Abnormal   Collection Time: 08/17/18  7:59 PM  Result Value Ref Range   Creatinine, Urine 556.00 mg/dL   Total Protein, Urine 178 mg/dL   Protein Creatinine Ratio 0.32 (H) 0.00 - 0.15 mg/mg[Cre]  Glucose, capillary     Status: None   Collection Time: 08/18/18  5:41 AM  Result Value Ref Range   Glucose-Capillary 72 70 - 99 mg/dL    Scheduled Meds: . docusate sodium  100 mg  Oral Daily  . ketorolac  30 mg Intravenous Q6H  . methylPREDNISolone  16 mg Oral Q breakfast   Followed by  . [START ON 08/22/2018] methylPREDNISolone  8 mg Oral Q breakfast   Followed by  . [START ON 08/29/2018] methylPREDNISolone  4 mg Oral Q breakfast  . methylPREDNISolone  16 mg Oral Q1400   Followed by  . [START ON 08/20/2018] methylPREDNISolone  8 mg Oral Q1400   Followed by  . [START ON 08/23/2018] methylPREDNISolone  4 mg Oral Q1400  . methylPREDNISolone  16 mg Oral QHS   Followed by  . [START ON 08/21/2018] methylPREDNISolone  8 mg Oral QHS   Followed by  . [START ON 08/24/2018] methylPREDNISolone  4 mg Oral QHS  . polyethylene glycol powder  1 Container Oral Once  . prenatal multivitamin  1 tablet Oral Q1200  . pyridOXINE  100 mg Intravenous Daily   Continuous Infusions: . promethazine (PHENERGAN) IV infusion 25 mg (08/18/18 0740)   PRN Meds:acetaminophen, calcium carbonate, cyclobenzaprine, metoCLOPramide (REGLAN) injection  Assessment/Plan: Active Problems:   Hyperemesis affecting pregnancy, antepartum   LLQ pain   Back pain   Severe constipation  Repeat soap suds enema Advance to clears Continue Phenergan infusion Continue Medrol taper Add flexeril and toradol and tylenol for pain    LOS: 1 day   Reva Bores, MD 08/18/2018 7:54 AM

## 2018-08-19 ENCOUNTER — Inpatient Hospital Stay (HOSPITAL_COMMUNITY): Payer: Managed Care, Other (non HMO)

## 2018-08-19 DIAGNOSIS — O210 Mild hyperemesis gravidarum: Principal | ICD-10-CM

## 2018-08-19 LAB — GLUCOSE, CAPILLARY: GLUCOSE-CAPILLARY: 94 mg/dL (ref 70–99)

## 2018-08-19 MED ORDER — METHYLPREDNISOLONE SODIUM SUCC 40 MG IJ SOLR
16.0000 mg | Freq: Once | INTRAMUSCULAR | Status: AC
  Administered 2018-08-19: 16 mg via INTRAVENOUS
  Filled 2018-08-19: qty 0.4

## 2018-08-19 MED ORDER — MAGNESIUM CITRATE PO SOLN
1.0000 | Freq: Every day | ORAL | Status: AC
  Administered 2018-08-19 – 2018-08-21 (×3): 1 via ORAL
  Filled 2018-08-19 (×2): qty 296

## 2018-08-19 MED ORDER — PANTOPRAZOLE SODIUM 40 MG IV SOLR
40.0000 mg | Freq: Every day | INTRAVENOUS | Status: DC
  Administered 2018-08-19 – 2018-08-22 (×4): 40 mg via INTRAVENOUS
  Filled 2018-08-19 (×5): qty 40

## 2018-08-19 NOTE — Progress Notes (Signed)
Patient woke up from her sleep and pulled her IV out, site bleeding.  Patient crying, wanting to take a shower.  Complete bed linens changed and provided stuff to take a shower.

## 2018-08-19 NOTE — Progress Notes (Signed)
Patient ID: Tammy Andrews, female   DOB: Dec 26, 1990, 28 y.o.   MRN: [id number]   Subjective: Patient reports tolerating jello yesterday and nausea/emesis occuring with Toradol. Patient reports some improvement in her vaginal bleeding which was not as heavy but is still pink in color. She reports persistent back pain and reports the passage of some stool following enema  Objective: Vital signs in last 24 hours: Temp:  [98.6 F (37 C)-99.1 F (37.3 C)] 98.6 F (37 C) (02/02 0330) Pulse Rate:  [91-101] 96 (02/02 0330) Resp:  [17-19] 17 (02/02 0330) BP: (109-150)/(66-100) 131/78 (02/02 0330) SpO2:  [100 %] 100 % (02/02 0330)  Intake/Output from previous day: 02/01 0701 - 02/02 0700 In: 240 [P.O.:240] Out: 600 [Urine:500] Intake/Output this shift: No intake/output data recorded.  General appearance: alert, cooperative, appears stated age and resting without a lot of movement Back: lower back discomfort without true tenderness Lungs: normal effort Heart: regular rate and rhythm Abdomen: soft, LLQ tenderness, no rebound or guarding  Results for orders placed or performed during the hospital encounter of 08/17/18 (from the past 24 hour(s))  Glucose, capillary     Status: None   Collection Time: 08/19/18  4:58 AM  Result Value Ref Range   Glucose-Capillary 94 70 - 99 mg/dL    Scheduled Meds: . docusate sodium  100 mg Oral Daily  . methylPREDNISolone  16 mg Oral Q breakfast   Followed by  . [START ON 08/22/2018] methylPREDNISolone  8 mg Oral Q breakfast   Followed by  . [START ON 08/29/2018] methylPREDNISolone  4 mg Oral Q breakfast  . methylPREDNISolone  16 mg Oral Q1400   Followed by  . [START ON 08/20/2018] methylPREDNISolone  8 mg Oral Q1400   Followed by  . [START ON 08/23/2018] methylPREDNISolone  4 mg Oral Q1400  . methylPREDNISolone  16 mg Oral QHS   Followed by  . [START ON 08/21/2018] methylPREDNISolone  8 mg Oral QHS   Followed by  . [START ON 08/24/2018] methylPREDNISolone   4 mg Oral QHS  . pantoprazole (PROTONIX) IV  40 mg Intravenous Daily  . polyethylene glycol powder  1 Container Oral Once  . prenatal multivitamin  1 tablet Oral Q1200  . pyridOXINE  100 mg Intravenous Daily   Continuous Infusions: . promethazine (PHENERGAN) IV infusion 125 mL/hr at 08/19/18 0556   PRN Meds:acetaminophen, calcium carbonate, cyclobenzaprine, metoCLOPramide (REGLAN) injection, zolpidem  Assessment/Plan: Active Problems:   Hyperemesis affecting pregnancy, antepartum   LLQ pain   Back pain   Severe constipation  Continue clear diet Continue Phenergan infusion Continue Medrol taper Continue flexeril/toradol and tylenol for pain Will add protonix today Left renal ultrasound to rule out stone as possible etiology of her back pain     LOS: 2 days   Catalina Antigua, MD 08/19/2018 7:18 AM

## 2018-08-20 ENCOUNTER — Encounter: Payer: Self-pay | Admitting: *Deleted

## 2018-08-20 ENCOUNTER — Encounter (HOSPITAL_COMMUNITY): Payer: Self-pay | Admitting: Obstetrics and Gynecology

## 2018-08-20 DIAGNOSIS — O209 Hemorrhage in early pregnancy, unspecified: Secondary | ICD-10-CM

## 2018-08-20 DIAGNOSIS — O99612 Diseases of the digestive system complicating pregnancy, second trimester: Secondary | ICD-10-CM

## 2018-08-20 LAB — TYPE AND SCREEN
ABO/RH(D): O POS
Antibody Screen: NEGATIVE

## 2018-08-20 LAB — GLUCOSE, CAPILLARY: Glucose-Capillary: 118 mg/dL — ABNORMAL HIGH (ref 70–99)

## 2018-08-20 LAB — GLUCOSE, RANDOM: Glucose, Bld: 121 mg/dL — ABNORMAL HIGH (ref 70–99)

## 2018-08-20 MED ORDER — POLYETHYLENE GLYCOL 3350 17 G PO PACK: 17.0000 g | PACK | Freq: Two times a day (BID) | ORAL | Status: DC

## 2018-08-20 MED ORDER — POLYETHYLENE GLYCOL 3350 17 G PO PACK: 17.0000 g | PACK | Freq: Every day | ORAL | Status: DC

## 2018-08-20 MED ORDER — ZOLPIDEM TARTRATE 5 MG PO TABS
5.0000 mg | ORAL_TABLET | Freq: Every evening | ORAL | Status: DC | PRN
  Administered 2018-08-20 – 2018-08-22 (×2): 5 mg via ORAL
  Filled 2018-08-20 (×2): qty 1

## 2018-08-20 NOTE — Progress Notes (Signed)
OB Note Patient states small BM after rpt enema. N/v today so nothing PO. D/w her that if not improved by tomorrow then will get GI consult  Cornelia Copa MD Attending Center for [facility] ([facility]ulty [facility]) 08/20/2018 Time: [id number]

## 2018-08-20 NOTE — Progress Notes (Signed)
Daily Antepartum Note  Admission Date: 08/17/2018 Current Date: 08/20/2018 10:33 AM  Tammy Andrews is a 28 y.o. G3P0110 @ [redacted]w[redacted]d, HD#4, admitted for constipation, n/v of pregnancy.  Pregnancy complicated by: Patient Active Problem List   Diagnosis Date Noted  . Marijuana abuse 08/09/2018  . H/O bariatric surgery 08/09/2018  . Anemia in pregnancy 08/09/2018  . Vaginal bleeding during pregnancy 08/09/2018  . Constipation during pregnancy 08/06/2018  . Back pain affecting pregnancy 08/06/2018  . Supervision of high risk pregnancy, antepartum 08/03/2018  . Subchorionic hemorrhage in second trimester 08/02/2018  . History of fetal demise, not currently pregnant 07/29/2018  . Hyperemesis gravidarum before end of [redacted] week gestation with dehydration 07/27/2018  . Chronic hypertension during pregnancy, antepartum 11/03/2017    Overnight/24hr events:  none  Subjective:  Patient able to take some liquids. No further BMs after molasses and milk enema yesterday. No current nausea or vomiting. Two pads VB yesterday (BRB and dark), still with vague lower back and lower belly discomfort.   Objective:     Current Vital Signs 24h Vital Sign Ranges  T 98.8 F (37.1 C) Temp  Avg: 98.9 F (37.2 C)  Min: 98.7 F (37.1 C)  Max: 99.1 F (37.3 C)  BP 135/83 BP  Min: 119/73  Max: 137/89  HR 86 Pulse  Avg: 94  Min: 86  Max: 98  RR 17 Resp  Avg: 17.5  Min: 17  Max: 18  SaO2 100 % Room Air SpO2  Avg: 100 %  Min: 100 %  Max: 100 %       24 Hour I/O Current Shift I/O  Time Ins Outs 02/02 0701 - 02/03 0700 In: 250 [P.O.:250] Out: 202  No intake/output data recorded.   Patient Vitals for the past 24 hrs:  BP Temp Temp src Pulse Resp SpO2  08/20/18 0535 135/83 98.8 F (37.1 C) Oral 86 17 100 %  08/19/18 2007 124/69 99.1 F (37.3 C) Oral 96 17 100 %  08/19/18 1548 137/89 98.7 F (37.1 C) Oral 98 18 100 %  08/19/18 1135 119/73 99 F (37.2 C) Oral 96 18 100 %    Physical exam: General: Well  nourished, well developed female in no acute distress. Abdomen: gravid nttp Cardiovascular: S1, S2 normal, no murmur, rub or gallop, regular rate and rhythm Respiratory: CTAB Extremities: no clubbing, cyanosis or edema Skin: Warm and dry.   Medications: Current Facility-Administered Medications  Medication Dose Route Frequency Provider Last Rate Last Dose  . acetaminophen (TYLENOL) tablet 650 mg  650 mg Oral Q4H PRN Constant, Peggy, MD   650 mg at 08/20/18 0946  . cyclobenzaprine (FLEXERIL) tablet 10 mg  10 mg Oral TID PRN Reva Bores, MD   10 mg at 08/20/18 0601  . magnesium citrate solution 1 Bottle  1 Bottle Oral Daily Willodean Rosenthal, MD   1 Bottle at 08/19/18 1420  . methylPREDNISolone (MEDROL) tablet 16 mg  16 mg Oral Q breakfast Constant, Peggy, MD   16 mg at 08/20/18 0851   Followed by  . [START ON 08/22/2018] methylPREDNISolone (MEDROL) tablet 8 mg  8 mg Oral Q breakfast Constant, Peggy, MD       Followed by  . [START ON 08/29/2018] methylPREDNISolone (MEDROL) tablet 4 mg  4 mg Oral Q breakfast Constant, Peggy, MD      . methylPREDNISolone (MEDROL) tablet 16 mg  16 mg Oral QHS Constant, Peggy, MD   16 mg at 08/18/18 2205   Followed by  . [  START ON 08/21/2018] methylPREDNISolone (MEDROL) tablet 8 mg  8 mg Oral QHS Constant, Peggy, MD       Followed by  . [START ON 08/24/2018] methylPREDNISolone (MEDROL) tablet 4 mg  4 mg Oral QHS Constant, Peggy, MD      . methylPREDNISolone (MEDROL) tablet 8 mg  8 mg Oral Q1400 Constant, Peggy, MD       Followed by  . [START ON 08/23/2018] methylPREDNISolone (MEDROL) tablet 4 mg  4 mg Oral Q1400 Constant, Peggy, MD      . metoCLOPramide (REGLAN) injection 10 mg  10 mg Intravenous Q6H PRN Constant, Peggy, MD   10 mg at 08/19/18 1645  . pantoprazole (PROTONIX) injection 40 mg  40 mg Intravenous Daily Constant, Peggy, MD   40 mg at 08/20/18 0947  . polyethylene glycol (MIRALAX / GLYCOLAX) packet 17 g  17 g Oral Daily [place] Bing, MD      .  prenatal multivitamin tablet 1 tablet  1 tablet Oral Q1200 Constant, Peggy, MD   1 tablet at 08/18/18 1300  . promethazine (PHENERGAN) 25 mg in sodium chloride 0.9 % 1,000 mL infusion  25 mg Intravenous Continuous Constant, Peggy, MD 125 mL/hr at 08/20/18 0521 25 mg at 08/20/18 0521  . pyridOXINE (B-6) injection 100 mg  100 mg Intravenous Daily Constant, Peggy, MD   100 mg at 08/20/18 0947    Labs:  Recent Labs  Lab 08/15/18 0852  WBC 13.9*  HGB 10.1*  HCT 29.6*  PLT 240    Recent Labs  Lab 08/20/18 0530  GLUCOSE 121*     Radiology: no new imaging  Assessment & Plan:  Pt improving *Pregnancy: PNV, FHTs qshift *N/V of pregnancy: continue steroid taper (D#4), current medication regimen *Constipation: continue current regimen. Pt amenable to another M-M enema.  *VB in pregnancy: pt asked to keep pads so I can see how much. No current interventions given the early gestational age. Pt is Rh pos *cHTN: no current issue. Pt not on any meds.  *PPx: SCDs, OOB ad lib *FEN/GI: will transition to full liquid *Dispo: potentially 2/5  Cornelia Copa MD Attending Center for [facility])

## 2018-08-21 ENCOUNTER — Encounter (HOSPITAL_COMMUNITY): Payer: Self-pay | Admitting: Gastroenterology

## 2018-08-21 LAB — COMPREHENSIVE METABOLIC PANEL
ALT: 10 U/L (ref 0–44)
AST: 14 U/L — ABNORMAL LOW (ref 15–41)
Albumin: 3.5 g/dL (ref 3.5–5.0)
Alkaline Phosphatase: 29 U/L — ABNORMAL LOW (ref 38–126)
Anion gap: 8 (ref 5–15)
CO2: 20 mmol/L — AB (ref 22–32)
Calcium: 8.6 mg/dL — ABNORMAL LOW (ref 8.9–10.3)
Chloride: 103 mmol/L (ref 98–111)
Creatinine, Ser: 0.43 mg/dL — ABNORMAL LOW (ref 0.44–1.00)
GFR calc non Af Amer: >60 mL/min (ref >60)
Glucose, Bld: 98 mg/dL (ref 70–99)
Potassium: 3.3 mmol/L — ABNORMAL LOW (ref 3.5–5.1)
SODIUM: 131 mmol/L — AB (ref 135–145)
Total Bilirubin: 0.7 mg/dL (ref 0.3–1.2)
Total Protein: 7 g/dL (ref 6.5–8.1)

## 2018-08-21 LAB — CBC
HCT: 26.8 % — ABNORMAL LOW (ref 36.0–46.0)
Hemoglobin: 9.2 g/dL — ABNORMAL LOW (ref 12.0–15.0)
MCH: 31.3 pg (ref 26.0–34.0)
MCHC: 34.3 g/dL (ref 30.0–36.0)
MCV: 91.2 fL (ref 80.0–100.0)
NRBC: 0 % (ref 0.0–0.2)
Platelets: 263 10*3/uL (ref 150–400)
RBC: 2.94 MIL/uL — ABNORMAL LOW (ref 3.87–5.11)
RDW: 13.6 % (ref 11.5–15.5)
WBC: 14.8 10*3/uL — ABNORMAL HIGH (ref 4.0–10.5)

## 2018-08-21 LAB — AMYLASE: Amylase: 171 U/L — ABNORMAL HIGH (ref 28–100)

## 2018-08-21 LAB — GLUCOSE, RANDOM: Glucose, Bld: 90 mg/dL (ref 70–99)

## 2018-08-21 LAB — GLUCOSE, CAPILLARY: Glucose-Capillary: 86 mg/dL (ref 70–99)

## 2018-08-21 LAB — LIPASE, BLOOD: LIPASE: 63 U/L — AB (ref 11–51)

## 2018-08-21 LAB — MAGNESIUM: Magnesium: 1.8 mg/dL (ref 1.7–2.4)

## 2018-08-21 MED ORDER — POLYETHYLENE GLYCOL 3350 17 GM/SCOOP PO POWD
17.0000 g | Freq: Every day | ORAL | Status: DC
  Filled 2018-08-21: qty 238

## 2018-08-21 MED ORDER — ENSURE ENLIVE PO LIQD
237.0000 mL | Freq: Four times a day (QID) | ORAL | Status: DC
  Administered 2018-08-21 – 2018-08-23 (×6): 237 mL via ORAL
  Filled 2018-08-21 (×8): qty 237

## 2018-08-21 MED ORDER — POLYETHYLENE GLYCOL 3350 17 GM/SCOOP PO POWD
17.0000 g | Freq: Four times a day (QID) | ORAL | Status: DC
  Administered 2018-08-21 – 2018-08-22 (×5): 17 g via ORAL
  Filled 2018-08-21: qty 255

## 2018-08-21 MED ORDER — VITAMIN B-6 50 MG PO TABS
50.0000 mg | ORAL_TABLET | Freq: Every day | ORAL | Status: DC
  Administered 2018-08-22 – 2018-08-23 (×2): 50 mg via ORAL
  Filled 2018-08-21 (×2): qty 1

## 2018-08-21 MED ORDER — ENSURE ENLIVE PO LIQD
237.0000 mL | Freq: Two times a day (BID) | ORAL | Status: DC
  Administered 2018-08-21 (×2): 237 mL via ORAL
  Filled 2018-08-21 (×2): qty 237

## 2018-08-21 MED ORDER — POTASSIUM CHLORIDE CRYS ER 20 MEQ PO TBCR
30.0000 meq | EXTENDED_RELEASE_TABLET | Freq: Two times a day (BID) | ORAL | Status: DC
  Administered 2018-08-21 – 2018-08-22 (×3): 30 meq via ORAL
  Filled 2018-08-21 (×4): qty 1

## 2018-08-21 MED ORDER — POTASSIUM CHLORIDE 10 MEQ/100ML IV SOLN
10.0000 meq | INTRAVENOUS | Status: AC
  Administered 2018-08-21 (×3): 10 meq via INTRAVENOUS
  Filled 2018-08-21 (×3): qty 100

## 2018-08-21 NOTE — Progress Notes (Signed)
Initial Nutrition Assessment  DOCUMENTATION CODES:   Obesity unspecified  INTERVENTION:  Soft diet, small freq meals/snacks Ensure Enlive QID Please weigh pt.  NUTRITION DIAGNOSIS:  Inadequate oral intake related to nausea, vomiting as evidenced by energy intake < 75% for > 7 days.  Pt with significant concerns for malnutrition given reported weight loss ( 17 % of usual) and reported typical po intake  GOAL:  Weight gain  MONITOR:  Weight trends  REASON FOR ASSESSMENT:  Antenatal, Other (Comment)(hyperemesis with significant weight loss)   ASSESSMENT:   15 5/7 weeks IUP, hyperemesis. Multiple adm to [facility]. Reports prepregnancy weight of 256 lbs, overall 44 lb weight loss. reports 13 lb weight loss in 1 week PTA. Improved tol of diet today. Had consumed grits and dry toast just before my visit. Is able to tolerate Ensure well and has requested the Ensure be increased to 4 times per day  Pt reports many days ( at home ) she can keep 2 Ensure down ( Ensure Enlive 350 Kcal, 20 g protein )  Diet Order:   Diet Order            Diet NPO time specified Except for: Sips with Meds  Diet effective midnight        DIET SOFT Room service appropriate? Yes; Fluid consistency: Thin  Diet effective now             EDUCATION NEEDS:   No education needs have been identified at this time(discussed progession of diet, smal freq low fat bland meals)  Skin:  Skin Assessment: Reviewed RN Assessment  Last BM:  2/3  Height:   Ht Readings from Last 1 Encounters:  08/17/18 5\' 6"  (1.676 m)    Weight:   Wt Readings from Last 1 Encounters:  08/17/18 96.6 kg    Ideal Body Weight:   130 lbs  BMI:  Body mass index is 34.38 kg/m.  Estimated Nutritional Needs:   Kcal:  2200 -2400   Protein:  98-108 g  Fluid:  2.5 L    Elisabeth Cara M.Odis Luster LDN Neonatal Nutrition Support Specialist/RD III Pager [phone]      Phone [phone]

## 2018-08-21 NOTE — Progress Notes (Signed)
Daily Antepartum Note  Admission Date: 08/17/2018 Current Date: 08/21/2018 9:20 AM  Tammy Andrews is a 28 y.o. G3P0110 @ [redacted]w[redacted]d, HD#5, admitted for constipation, n/v of pregnancy.  Pregnancy complicated by: Patient Active Problem List   Diagnosis Date Noted  . Marijuana abuse 08/09/2018  . H/O bariatric surgery 08/09/2018  . Anemia in pregnancy 08/09/2018  . Vaginal bleeding during pregnancy 08/09/2018  . Constipation during pregnancy 08/06/2018  . Back pain affecting pregnancy 08/06/2018  . Supervision of high risk pregnancy, antepartum 08/03/2018  . Subchorionic hemorrhage in second trimester 08/02/2018  . History of fetal demise, not currently pregnant 07/29/2018  . Hyperemesis gravidarum before end of [redacted] week gestation with dehydration 07/27/2018  . Chronic hypertension during pregnancy, antepartum 11/03/2017    Overnight/24hr events:  Had n/v earlier this morning at around 3am  Subjective:  Minimal PO. No BMs since enema yesterday. Stable VB. Pt continues to have low back pain that "feels like back is caving in"  Objective:     Current Vital Signs 24h Vital Sign Ranges  T 99.3 F (37.4 C) Temp  Avg: 98.8 F (37.1 C)  Min: 97.9 F (36.6 C)  Max: 99.3 F (37.4 C)  BP 132/85 BP  Min: 132/85  Max: 149/95  HR 95 Pulse  Avg: 92.3  Min: 83  Max: 110  RR 18 Resp  Avg: 17.4  Min: 16  Max: 18  SaO2 99 % Room Air SpO2  Avg: 99.8 %  Min: 99 %  Max: 100 %       24 Hour I/O Current Shift I/O  Time Ins Outs 02/03 0701 - 02/04 0700 In: 9371.8 [I.V.:9371.8] Out: -  No intake/output data recorded.   Patient Vitals for the past 24 hrs:  BP Temp Temp src Pulse Resp SpO2  08/21/18 0753 132/85 99.3 F (37.4 C) Oral 95 18 99 %  08/21/18 0509 (!) 143/88 98.8 F (37.1 C) Oral 87 18 100 %  08/20/18 2211 135/87 98.7 F (37.1 C) Oral 83 - 100 %  08/20/18 1939 (!) 149/95 99.3 F (37.4 C) Oral 90 17 100 %  08/20/18 1714 (!) 146/93 97.9 F (36.6 C) Axillary (!) 110 18 100 %   08/20/18 1432 132/88 98.8 F (37.1 C) Oral 89 16 100 %   FHT: 145  Physical exam: General: Well nourished, well developed female in no acute distress. Abdomen: gravid nttp Cardiovascular: S1, S2 normal, no murmur, rub or gallop, regular rate and rhythm Respiratory: CTAB Extremities: no clubbing, cyanosis or edema Skin: Warm and dry.   Medications: Current Facility-Administered Medications  Medication Dose Route Frequency Provider Last Rate Last Dose  . acetaminophen (TYLENOL) tablet 650 mg  650 mg Oral Q4H PRN Constant, Peggy, MD   650 mg at 08/20/18 0946  . cyclobenzaprine (FLEXERIL) tablet 10 mg  10 mg Oral TID PRN Reva Bores, MD   10 mg at 08/21/18 0758  . feeding supplement (ENSURE ENLIVE) (ENSURE ENLIVE) liquid 237 mL  237 mL Oral BID BM [place] Bing, MD      . magnesium citrate solution 1 Bottle  1 Bottle Oral Daily Willodean Rosenthal, MD   1 Bottle at 08/20/18 1054  . [START ON 08/22/2018] methylPREDNISolone (MEDROL) tablet 8 mg  8 mg Oral Q breakfast Constant, Peggy, MD       Followed by  . [START ON 08/29/2018] methylPREDNISolone (MEDROL) tablet 4 mg  4 mg Oral Q breakfast Constant, Peggy, MD      . methylPREDNISolone (MEDROL) tablet  8 mg  8 mg Oral Q1400 Constant, Peggy, MD   8 mg at 08/20/18 1332   Followed by  . [START ON 08/23/2018] methylPREDNISolone (MEDROL) tablet 4 mg  4 mg Oral Q1400 Constant, Peggy, MD      . methylPREDNISolone (MEDROL) tablet 8 mg  8 mg Oral QHS Constant, Peggy, MD       Followed by  . [START ON 08/24/2018] methylPREDNISolone (MEDROL) tablet 4 mg  4 mg Oral QHS Constant, Peggy, MD      . metoCLOPramide (REGLAN) injection 10 mg  10 mg Intravenous Q6H PRN Constant, Peggy, MD   10 mg at 08/19/18 1645  . pantoprazole (PROTONIX) injection 40 mg  40 mg Intravenous Daily Constant, Peggy, MD   40 mg at 08/20/18 0947  . polyethylene glycol (MIRALAX / GLYCOLAX) packet 17 g  17 g Oral Daily [place] Bing, MD      . prenatal multivitamin tablet 1  tablet  1 tablet Oral Q1200 Constant, Peggy, MD   1 tablet at 08/18/18 1300  . promethazine (PHENERGAN) 25 mg in sodium chloride 0.9 % 1,000 mL infusion  25 mg Intravenous Continuous Constant, Peggy, MD 125 mL/hr at 08/21/18 0741 25 mg at 08/21/18 0741  . pyridOXINE (B-6) injection 100 mg  100 mg Intravenous Daily Constant, Peggy, MD   100 mg at 08/20/18 0947  . zolpidem (AMBIEN) tablet 5 mg  5 mg Oral QHS PRN Hermina Staggers, MD   5 mg at 08/20/18 2235    Labs:  Recent Labs  Lab 08/15/18 0852  WBC 13.9*  HGB 10.1*  HCT 29.6*  PLT 240    Recent Labs  Lab 08/20/18 0530 08/21/18 0519  GLUCOSE 121* 90     Radiology: no new imaging  Assessment & Plan:  Pt improving *Pregnancy: PNV, FHTs qshift *N/V of pregnancy: Will consult GI todaycontinue steroid taper (D#5), current medication regimen. Pt states she's tried reglan and diclegis in the past with no help. *Constipation: continue current regimen.  *VB in pregnancy: Stable  No current interventions given the early gestational age. Pt is Rh pos *cHTN: no current issue. Pt not on any meds.  *PPx: SCDs, OOB ad lib *FEN/GI: MIVF with phenergan, full liquid.  *Dispo: when able to take PO  Cornelia Copa. MD Attending Center for [facility])

## 2018-08-21 NOTE — Consult Note (Signed)
Reason for Consult: Hyperemesis constipation abdominal pain Referring Physician: OB team  Tammy Andrews is an 28 y.o. female.  HPI: Patient seen and examined and case discussed with her husband and hospital computer chart reviewed and she has had 3 pregnancies all with hyperemesis the first 1 ending in a stillborn the second she required termination after 1 week because of the sickness and with this when she is lost 50 pounds and increased upper abdominal pain nausea vomiting and constipation in between pregnancies she is asymptomatic and 2 years ago she had a repeat endoscopy which showed her sleeve was okay she says and that was done in [place] and her family history is negative for any GI issues or pregnancy problems and today she is actually feeling better and is able to keep liquids down and has no new complaints  Past Medical History:  Diagnosis Date  . Anxiety   . Asthma    childhood  . Depression   . Preterm labor     Past Surgical History:  Procedure Laterality Date  . DILATION AND CURETTAGE OF UTERUS    . LAPAROSCOPIC GASTRIC SLEEVE RESECTION  08/2014    Family History  Problem Relation Age of Onset  . Hypertension Father   . Diabetes Paternal Grandmother     Social History:  reports that she quit smoking about 2 years ago. She has never used smokeless tobacco. She reports previous alcohol use. She reports previous drug use. Drug: Marijuana.  Allergies:  Allergies  Allergen Reactions  . Pork-Derived Products     Medications: I have reviewed the patient's current medications.  Results for orders placed or performed during the hospital encounter of 08/17/18 (from the past 48 hour(s))  Glucose, capillary     Status: Abnormal   Collection Time: 08/20/18  5:29 AM  Result Value Ref Range   Glucose-Capillary 118 (H) 70 - 99 mg/dL  Glucose, fasting - daily while on taper     Status: Abnormal   Collection Time: 08/20/18  5:30 AM  Result Value Ref Range   Glucose,  Bld 121 (H) 70 - 99 mg/dL    Comment: Performed at [facility], [address], [place], [place] [address]  Type and screen [facility] [place]     Status: None   Collection Time: 08/20/18  6:23 AM  Result Value Ref Range   ABO/RH(D) O POS    Antibody Screen NEG    Sample Expiration      08/23/2018 Performed at [facility], [address], [place], [place] [address]   Glucose, capillary     Status: None   Collection Time: 08/21/18  5:08 AM  Result Value Ref Range   Glucose-Capillary 86 70 - 99 mg/dL  Glucose, fasting - daily while on taper     Status: None   Collection Time: 08/21/18  5:19 AM  Result Value Ref Range   Glucose, Bld 90 70 - 99 mg/dL    Comment: Performed at [facility], [address] [place], [place] [address]  Magnesium     Status: None   Collection Time: 08/21/18 10:14 AM  Result Value Ref Range   Magnesium 1.8 1.7 - 2.4 mg/dL    Comment: Performed at [facility], [address] [place], [place] [address]  CBC     Status: Abnormal   Collection Time: 08/21/18 10:14 AM  Result Value Ref Range   WBC 14.8 (H) 4.0 - 10.5 K/uL   RBC 2.94 (L) 3.87 - 5.11  MIL/uL   Hemoglobin 9.2 (L) 12.0 - 15.0 g/dL   HCT 19.3 (L) 79.0 - 24.0 %   MCV 91.2 80.0 - 100.0 fL   MCH 31.3 26.0 - 34.0 pg   MCHC 34.3 30.0 - 36.0 g/dL   RDW 97.3 53.2 - 99.2 %   Platelets 263 150 - 400 K/uL   nRBC 0.0 0.0 - 0.2 %    Comment: Performed at [facility], [address], [place], [place] [address]  Amylase     Status: Abnormal   Collection Time: 08/21/18 10:14 AM  Result Value Ref Range   Amylase 171 (H) 28 - 100 U/L    Comment: Performed at [facility], [address], [place], [place] [address]  Lipase, blood     Status: Abnormal   Collection Time: 08/21/18 10:14 AM  Result Value Ref Range   Lipase 63 (H) 11 - 51 U/L    Comment: Performed at [facility], [address], [place], [place] [address]  Comprehensive metabolic panel     Status:  Abnormal   Collection Time: 08/21/18 10:14 AM  Result Value Ref Range   Sodium 131 (L) 135 - 145 mmol/L   Potassium 3.3 (L) 3.5 - 5.1 mmol/L   Chloride 103 98 - 111 mmol/L   CO2 20 (L) 22 - 32 mmol/L   Glucose, Bld 98 70 - 99 mg/dL   BUN <5 (L) 6 - 20 mg/dL   Creatinine, Ser 8.92 (L) 0.44 - 1.00 mg/dL   Calcium 8.6 (L) 8.9 - 10.3 mg/dL   Total Protein 7.0 6.5 - 8.1 g/dL   Albumin 3.5 3.5 - 5.0 g/dL   AST 14 (L) 15 - 41 U/L   ALT 10 0 - 44 U/L   Alkaline Phosphatase 29 (L) 38 - 126 U/L   Total Bilirubin 0.7 0.3 - 1.2 mg/dL   GFR calc non Af Amer >60 >60 mL/min   GFR calc Af Amer >60 >60 mL/min   Anion gap 8 5 - 15    Comment: Performed at [facility], [address] [place], [place] [address]    No results found.  ROS negative except above Blood pressure 132/85, pulse 95, temperature 99.3 F (37.4 C), temperature source Oral, resp. rate 18, height 5\' 6"  (1.676 m), weight 96.6 kg, last menstrual period 04/19/2018, SpO2 99 %, unknown if currently breastfeeding. Physical Exam vital signs stable afebrile no acute distress patient in good spirits exam pertinent for her abdomen being soft occasional bowel sounds no succussion splash nontender white count mildly elevated globin stable chemistries okay very minimal increased amylase lipase probably due to pregnancy potassium a little low positive THC on 3 blood tests regular x-ray okay  Assessment/Plan: Hyperemesis and constipation Plan: Would recommend a gallbladder and pancreas ultrasound just to be sure they are not playing a role and I would recommend giving her potassium to keep her level greater than 4 and caution her about THC use which can play a role with nausea and vomiting related gastric emptying etc. and vaping can do the same and I would minimize pain medicine although she has asked for some and would increase MiraLAX up to 4 times a day and consider Ensure boost or even TPN if needed and please call us back if we can be of  any further assistance with this hospital stay  [facility] E 08/21/2018, 2:12 PM

## 2018-08-22 ENCOUNTER — Inpatient Hospital Stay (HOSPITAL_COMMUNITY): Payer: Managed Care, Other (non HMO)

## 2018-08-22 LAB — COMPREHENSIVE METABOLIC PANEL
ALT: 11 U/L (ref 0–44)
AST: 15 U/L (ref 15–41)
Albumin: 3 g/dL — ABNORMAL LOW (ref 3.5–5.0)
Alkaline Phosphatase: 25 U/L — ABNORMAL LOW (ref 38–126)
Anion gap: 5 (ref 5–15)
BUN: <5 mg/dL — ABNORMAL LOW (ref 6–20)
CALCIUM: 8.3 mg/dL — AB (ref 8.9–10.3)
CO2: 23 mmol/L (ref 22–32)
Chloride: 106 mmol/L (ref 98–111)
Creatinine, Ser: 0.4 mg/dL — ABNORMAL LOW (ref 0.44–1.00)
GFR calc Af Amer: >60 mL/min (ref >60)
GFR calc non Af Amer: >60 mL/min (ref >60)
Glucose, Bld: 82 mg/dL (ref 70–99)
Potassium: 3.2 mmol/L — ABNORMAL LOW (ref 3.5–5.1)
Sodium: 134 mmol/L — ABNORMAL LOW (ref 135–145)
TOTAL PROTEIN: 6.2 g/dL — AB (ref 6.5–8.1)
Total Bilirubin: 0.4 mg/dL (ref 0.3–1.2)

## 2018-08-22 LAB — GLUCOSE, CAPILLARY: Glucose-Capillary: 123 mg/dL — ABNORMAL HIGH (ref 70–99)

## 2018-08-22 LAB — LIPASE, BLOOD: Lipase: 48 U/L (ref 11–51)

## 2018-08-22 LAB — AMYLASE: Amylase: 116 U/L — ABNORMAL HIGH (ref 28–100)

## 2018-08-22 LAB — GLUCOSE, RANDOM: Glucose, Bld: 119 mg/dL — ABNORMAL HIGH (ref 70–99)

## 2018-08-22 MED ORDER — PANTOPRAZOLE SODIUM 40 MG PO TBEC
40.0000 mg | DELAYED_RELEASE_TABLET | Freq: Every day | ORAL | Status: DC
  Administered 2018-08-23: 40 mg via ORAL
  Filled 2018-08-22: qty 1

## 2018-08-22 MED ORDER — POTASSIUM CHLORIDE CRYS ER 20 MEQ PO TBCR
30.0000 meq | EXTENDED_RELEASE_TABLET | Freq: Two times a day (BID) | ORAL | Status: DC
  Administered 2018-08-22 – 2018-08-23 (×2): 30 meq via ORAL
  Filled 2018-08-22 (×2): qty 1

## 2018-08-22 NOTE — Progress Notes (Addendum)
Daily Antepartum Note  Admission Date: 08/17/2018 Current Date: 08/22/2018 1:29 PM  Feliberto HartsChynna Andres ShadCrockett is a 28 y.o. G3P0110 @ 2048w6d, HD#5, admitted for constipation, n/v of pregnancy.  Pregnancy complicated by: Patient Active Problem List   Diagnosis Date Noted  . Marijuana abuse 08/09/2018  . H/O bariatric surgery 08/09/2018  . Anemia in pregnancy 08/09/2018  . Vaginal bleeding during pregnancy 08/09/2018  . Constipation during pregnancy 08/06/2018  . Back pain affecting pregnancy 08/06/2018  . Supervision of high risk pregnancy, antepartum 08/03/2018  . Subchorionic hemorrhage in second trimester 08/02/2018  . History of fetal demise, not currently pregnant 07/29/2018  . Hyperemesis gravidarum before end of [redacted] week gestation with dehydration 07/27/2018  . Chronic hypertension during pregnancy, antepartum 11/03/2017    Overnight/24hr events:  Seen by GI  Subjective:  No BM. Feels better, taking PO and had some grits, soft food.   Objective:     Current Vital Signs 24h Vital Sign Ranges  T 98.6 F (37 C) Temp  Avg: 98.6 F (37 C)  Min: 98.4 F (36.9 C)  Max: 98.7 F (37.1 C)  BP 133/88 BP  Min: 119/84  Max: 140/82  HR 92 Pulse  Avg: 92.5  Min: 87  Max: 96  RR 16 Resp  Avg: 17.2  Min: 16  Max: 18  SaO2 100 % Room Air SpO2  Avg: 100 %  Min: 100 %  Max: 100 %       24 Hour I/O Current Shift I/O  Time Ins Outs 02/04 0701 - 02/05 0700 In: 480 [P.O.:480] Out: 800 [Urine:800] 02/05 0701 - 02/05 1900 In: -  Out: 100 [Urine:100]   Patient Vitals for the past 24 hrs:  BP Temp Temp src Pulse Resp SpO2  08/22/18 1130 133/88 98.6 F (37 C) Oral 92 16 100 %  08/22/18 0754 (!) 132/96 98.6 F (37 C) Oral 94 17 100 %  08/22/18 0304 140/82 98.4 F (36.9 C) Oral 87 17 100 %  08/21/18 2213 119/84 98.7 F (37.1 C) Oral 93 18 100 %  08/21/18 2000 126/76 98.6 F (37 C) Oral 93 17 100 %  08/21/18 1608 131/82 98.6 F (37 C) Oral 96 18 100 %   FHT: pending  Physical  exam: General: Well nourished, well developed female in no acute distress. Abdomen: gravid nttp Cardiovascular: S1, S2 normal, no murmur, rub or gallop, regular rate and rhythm Respiratory: CTAB Extremities: no clubbing, cyanosis or edema Skin: Warm and dry.   Medications: Current Facility-Administered Medications  Medication Dose Route Frequency Provider Last Rate Last Dose  . acetaminophen (TYLENOL) tablet 650 mg  650 mg Oral Q4H PRN Constant, Peggy, MD   650 mg at 08/20/18 0946  . cyclobenzaprine (FLEXERIL) tablet 10 mg  10 mg Oral TID PRN Reva BoresPratt, Tanya S, MD   10 mg at 08/22/18 0257  . feeding supplement (ENSURE ENLIVE) (ENSURE ENLIVE) liquid 237 mL  237 mL Oral QID [place] BingPickens, Hernandez [place], MD   237 mL at 08/22/18 1156  . methylPREDNISolone (MEDROL) tablet 8 mg  8 mg Oral Q breakfast Constant, Peggy, MD   8 mg at 08/22/18 1156   Followed by  . [START ON 08/29/2018] methylPREDNISolone (MEDROL) tablet 4 mg  4 mg Oral Q breakfast Constant, Peggy, MD   4 mg at 08/22/18 1155  . methylPREDNISolone (MEDROL) tablet 8 mg  8 mg Oral Q1400 Constant, Peggy, MD   8 mg at 08/21/18 1411   Followed by  . [START ON 08/23/2018] methylPREDNISolone (MEDROL)  tablet 4 mg  4 mg Oral Q1400 Constant, Peggy, MD      . methylPREDNISolone (MEDROL) tablet 8 mg  8 mg Oral QHS Constant, Peggy, MD   8 mg at 08/21/18 2134   Followed by  . [START ON 08/24/2018] methylPREDNISolone (MEDROL) tablet 4 mg  4 mg Oral QHS Constant, Peggy, MD      . metoCLOPramide (REGLAN) injection 10 mg  10 mg Intravenous Q6H PRN Constant, Peggy, MD   10 mg at 08/22/18 0247  . pantoprazole (PROTONIX) injection 40 mg  40 mg Intravenous Daily Constant, Peggy, MD   40 mg at 08/22/18 1152  . polyethylene glycol powder (GLYCOLAX/MIRALAX) container 17 g  17 g Oral QID Vida Rigger, MD   17 g at 08/22/18 1157  . potassium chloride (K-DUR,KLOR-CON) CR tablet 30 mEq  30 mEq Oral BID [place] Bing, MD   30 mEq at 08/22/18 1154  . prenatal multivitamin tablet 1  tablet  1 tablet Oral Q1200 Constant, Peggy, MD   1 tablet at 08/21/18 1200  . promethazine (PHENERGAN) 25 mg in sodium chloride 0.9 % 1,000 mL infusion  25 mg Intravenous Continuous Constant, Peggy, MD 125 mL/hr at 08/22/18 1223 25 mg at 08/22/18 1223  . pyridOXINE (VITAMIN B-6) tablet 50 mg  50 mg Oral Daily [place] Bing, MD   50 mg at 08/22/18 1157  . zolpidem (AMBIEN) tablet 5 mg  5 mg Oral QHS PRN Hermina Staggers, MD   5 mg at 08/20/18 2235    Labs:  Recent Labs  Lab 08/21/18 1014  WBC 14.8*  HGB 9.2*  HCT 26.8*  PLT 263    Recent Labs  Lab 08/21/18 1014 08/22/18 0618 08/22/18 1142  NA 131*  --  134*  K 3.3*  --  3.2*  CL 103  --  106  CO2 20*  --  23  BUN <5*  --  <5*  CREATININE 0.43*  --  0.40*  CALCIUM 8.6*  --  8.3*  PROT 7.0  --  6.2*  BILITOT 0.7  --  0.4  ALKPHOS 29*  --  25*  ALT 10  --  11  AST 14*  --  15  GLUCOSE 98 119* 82   Lipase pending  Results for NEVEAHA, ABRAMYAN (MRN [id number]) as of 08/22/2018 14:32  Ref. Range 08/21/2018 10:14 08/22/2018 11:42  Amylase, Serum Latest Ref Range: 28 - 100 U/L 171 (H) 116 (H)    Radiology: negative ruq u/s  Assessment & Plan:  Pt improving *Pregnancy: PNV, FHTs daily *N/V of pregnancy: continue steroid taper (D#6), will switch to PO meds  *Constipation: continue current regimen.  *VB in pregnancy: Stable  No current interventions given the early gestational age. Pt is Rh pos *cHTN: no current issue. Pt not on any meds.  *PPx: SCDs, OOB ad lib *FEN/GI: soft diet *Dispo: hopefully tomorrow.   Cornelia Copa MD Attending Center for [facility])

## 2018-08-22 NOTE — Progress Notes (Addendum)
Phillip MD notified of FBS 123 no new orders received

## 2018-08-23 ENCOUNTER — Encounter: Payer: Self-pay | Admitting: Obstetrics and Gynecology

## 2018-08-23 LAB — GLUCOSE, CAPILLARY: GLUCOSE-CAPILLARY: 102 mg/dL — AB (ref 70–99)

## 2018-08-23 MED ORDER — PANTOPRAZOLE SODIUM 40 MG PO TBEC: 40.0000 mg | DELAYED_RELEASE_TABLET | Freq: Every day | ORAL | 1 refills | Status: DC

## 2018-08-23 MED ORDER — ENSURE ENLIVE PO LIQD: 237.0000 mL | Freq: Four times a day (QID) | ORAL | 3 refills | Status: DC

## 2018-08-23 MED ORDER — PYRIDOXINE HCL 50 MG PO TABS: 50.0000 mg | ORAL_TABLET | Freq: Every day | ORAL | 1 refills | Status: DC

## 2018-08-23 MED ORDER — POLYETHYLENE GLYCOL 3350 17 GM/SCOOP PO POWD: 17.0000 g | Freq: Four times a day (QID) | ORAL | 1 refills | Status: AC

## 2018-08-23 MED ORDER — METHYLPREDNISOLONE 4 MG PO TABS: 4.0000 mg | ORAL_TABLET | Freq: Every day | ORAL | 0 refills | Status: AC

## 2018-08-23 MED ORDER — METOCLOPRAMIDE HCL 10 MG PO TABS: 10.0000 mg | ORAL_TABLET | Freq: Four times a day (QID) | ORAL | 2 refills | Status: DC | PRN

## 2018-08-23 MED ORDER — POTASSIUM CHLORIDE CRYS ER 15 MEQ PO TBCR: 30.0000 meq | EXTENDED_RELEASE_TABLET | Freq: Two times a day (BID) | ORAL | 0 refills | Status: DC

## 2018-08-23 MED ORDER — PROMETHAZINE HCL 25 MG RE SUPP: 25.0000 mg | Freq: Four times a day (QID) | RECTAL | 1 refills | Status: DC | PRN

## 2018-08-23 MED ORDER — BISACODYL 10 MG RE SUPP: 10.0000 mg | Freq: Every day | RECTAL | 1 refills | Status: DC | PRN

## 2018-08-23 MED ORDER — PROMETHAZINE HCL 25 MG PO TABS: 25.0000 mg | ORAL_TABLET | Freq: Four times a day (QID) | ORAL | 0 refills | Status: DC

## 2018-08-23 NOTE — Progress Notes (Signed)
Pt discharged to home with husband.  Condition stable.  Pt ambulated to car with A. Sheria Lang, NT.  No equipment for home ordered at discharge.

## 2018-08-23 NOTE — Discharge Summary (Signed)
Discharge Summary   Admit Date: 08/17/2018 Discharge Date: 08/23/2018 Discharging Service: Obstetrics  Primary OBGYN: [facility] Admitting Physician: Catalina Antigua, MD  Discharge Physician: Vergie Living  Referring Provider: [facility]  Admission Diagnoses: *Pregnancy at 15/1 *Acute on chronic nausea/vomiting of pregnancy *Constipation *History of gastric sleeve *Chronic vaginal bleeding *Chronic HTN *History of THC use  Discharge Diagnoses: *Pregnancy at 16/0 *Improved n/v of pregnancy *Improved constipation   Consult Orders: CONSULT TO CARE MANAGEMENT METHYYPREDNISOLONE (MEDROL) TAPER FOR HYPEREMESIS GRAVIDARUM PHARMACY CONSULT  Gastroenterology  Surgeries/Procedures Performed: Multiple enemas  History and Physical: Tammy Andrews is a 28 y.o. female G3P0110 at [redacted]w[redacted]d presenting for hyperemesis gravidarum. Patient reports 7lb weight loss since her last admission. She reports taking phenergan, scopolamine patch and tylenol without improvement in her symptoms. She reports severe constipation since 07/25/2018.  Patient with prenatal care at [facility] complicated by [facility] currently not on medication and history of a 24-week IUFD          OB History    Gravida  3   Para  1   Term  0   Preterm  1   AB  1   Living  0     SAB  0   TAB  1   Ectopic  0   Multiple  0   Live Births  0        Obstetric Comments  G2: woke with bleeding and dx with IUFD when went to hospital. IOL SVD           Past Medical History:  Diagnosis Date  . Anxiety   . Asthma    childhood  . Depression   . Preterm labor         Past Surgical History:  Procedure Laterality Date  . DILATION AND CURETTAGE OF UTERUS    . LAPAROSCOPIC GASTRIC SLEEVE RESECTION  08/2014   Family History: family history includes Diabetes in her paternal grandmother; Hypertension in her father. Social History:  reports that she quit smoking about 2 years ago. She has never used smokeless  tobacco. She reports previous alcohol use. She reports previous drug use. Drug: Marijuana.     Maternal Diabetes: No Genetic Screening: pending Maternal Substance Abuse:  THC Significant Maternal Medications:  None Significant Maternal Lab Results:  None Other Comments:  None  ROS  See pertinent in HPI History Blood pressure 130/90, pulse 99, temperature 98.4 F (36.9 C), temperature source Oral, resp. rate 20, height 5\' 6"  (1.676 m), weight 96.6 kg, last menstrual period 04/19/2018, SpO2 99 %, unknown if currently breastfeeding. Exam Physical Exam  GENERAL: Well-developed, well-nourished female in no acute distress.  LUNGS: Clear to auscultation bilaterally.  HEART: Regular rate and rhythm. ABDOMEN: Soft, nontender, nondistended.  PELVIC: Not performed EXTREMITIES: No cyanosis, clubbing, or edema, 2+ distal pulses.  Prenatal labs: ABO, Rh: O/Positive/-- (01/17 1206) Antibody: Negative (01/17 1206) Rubella: 17.90 (01/17 1206) RPR: Non Reactive (01/17 1206)  HBsAg: Negative (01/17 1206)  HIV: Non Reactive (01/17 1206)  GBS:     Assessment/Plan: 28 yo G3P0110 at [redacted]w[redacted]d with hyperemesis gravidarum - Admit to antepartum - IV hydration - IV antiemetics - Medrol taper - Will check TSH - Continue monitoring BP - Will keep NPO for now   Peggy Constant 08/17/2018, 1:36 PM   Hospital Course: *Pregnancy: PNV, FHTs daily *N/V of pregnancy: patient continued on her steroid taper, which was started on the day of admission. She was eventually transitioned to PO meds after being on: MIVF with phenergan, steroid  taper, PPI,  IV B6. On the day of discharge, she was feeling much better and eating a soft diet *Constipation: patient had two BMs (one large and small) with an eventual regimen of milk and mol assess enemas. GI consulted and they recommend a RUQ and pancreas u/s which were negative and to continue to follow her amylase and lipase.  *Elevated amylase and lipase:  downtrending. Please repeat at next OB visit *VB in pregnancy: Stable  No current interventions given the early gestational age. Pt is Rh pos *cHTN: no current issue. Pt not on any meds. Low dose ASA stopped due to VB *PPx: SCDs, OOB ad lib  Discharge Exam:   Current Vital Signs 24h Vital Sign Ranges  T 98.3 F (36.8 C) Temp  Avg: 98.3 F (36.8 C)  Min: 97.9 F (36.6 C)  Max: 98.6 F (37 C)  BP 138/88 BP  Min: 120/80  Max: 143/91  HR 93 Pulse  Avg: 95.2  Min: 88  Max: 101  RR 18 Resp  Avg: 17.6  Min: 16  Max: 18  SaO2 100 % Room Air SpO2  Avg: 100 %  Min: 100 %  Max: 100 %       24 Hour I/O Current Shift I/O  Time Ins Outs 02/05 0701 - 02/06 0700 In: -  Out: 100 [Urine:100] No intake/output data recorded.    General appearance: Well nourished, well developed female in no acute distress.  Cardiovascular: S1, S2 normal, no murmur, rub or gallop, regular rate and rhythm Respiratory:  Clear to auscultation bilateral. Normal respiratory effort Abdomen: gravid, positive bowel sounds and no masses, hernias; diffusely non tender to palpation, non distended Neuro/Psych:  Normal mood and affect.  Skin:  Warm and dry.   Discharge Disposition:  Home  Patient Instructions:  Standard   Results Pending at Discharge:  None   Discharge Medications: Allergies as of 08/23/2018      Reactions   Pork-derived Products       Medication List    STOP taking these medications   aspirin EC 81 MG tablet   glycopyrrolate 1 MG tablet Commonly known as:  ROBINUL   scopolamine 1 MG/3DAYS Commonly known as:  TRANSDERM-SCOP (1.5 MG)     TAKE these medications   amitriptyline 50 MG tablet Commonly known as:  ELAVIL Take 1 tablet (50 mg total) by mouth daily.   bisacodyl 10 MG suppository Commonly known as:  DULCOLAX Place 1 suppository (10 mg total) rectally daily as needed for moderate constipation.   escitalopram 20 MG tablet Commonly known as:  LEXAPRO Take 1 tablet (20 mg total)  by mouth daily.   feeding supplement (ENSURE ENLIVE) Liqd Take 237 mLs by mouth 4 (four) times daily.   methylPREDNISolone 4 MG tablet Commonly known as:  MEDROL Take 1 tablet (4 mg total) by mouth daily for 7 days.   metoCLOPramide 10 MG tablet Commonly known as:  REGLAN Take 1 tablet (10 mg total) by mouth 4 (four) times daily as needed for nausea or vomiting.   pantoprazole 40 MG tablet Commonly known as:  PROTONIX Take 1 tablet (40 mg total) by mouth daily.   polyethylene glycol powder powder Commonly known as:  GLYCOLAX/MIRALAX Take 17 g by mouth 4 (four) times daily for 30 days.   potassium chloride SA 15 MEQ tablet Commonly known as:  KLOR-CON M15 Take 2 tablets (30 mEq total) by mouth 2 (two) times daily.   prenatal multivitamin Tabs tablet Take 1 tablet  by mouth daily at 12 noon.   promethazine 25 MG suppository Commonly known as:  PHENERGAN Place 1 suppository (25 mg total) rectally every 6 (six) hours as needed for nausea. What changed:  Another medication with the same name was added. Make sure you understand how and when to take each.   promethazine 25 MG tablet Commonly known as:  PHENERGAN Take 1 tablet (25 mg total) by mouth every 6 (six) hours for 30 days. What changed:  You were already taking a medication with the same name, and this prescription was added. Make sure you understand how and when to take each.   pyridOXINE 50 MG tablet Commonly known as:  B-6 Take 1 tablet (50 mg total) by mouth daily for 30 days.        Future Appointments  Date Time Provider Department Center  08/31/2018  9:35 AM [place] BingPickens, Laquisha Northcraft, MD [facility]  09/06/2018  [facility]00 [facility]  09/07/2018 11:15 AM Gwenevere AbbotPhillip, Nimeka, MD [facility]  09/13/2018  9:00 AM [facility] US 3 WH-MFCUS MFC-US  09/13/2018 10:00 AM [facility]IN[facility]OC  09/13/2018 11:15 AM Allie Bossierove, Myra C, MD [facility]    Cornelia Copaharlie Jamariah Tony, Jr.  MD Attending Center for [facility](Faculty Practice)

## 2018-08-27 ENCOUNTER — Other Ambulatory Visit (HOSPITAL_COMMUNITY)
Admission: RE | Admit: 2018-08-27 | Discharge: 2018-08-27 | Disposition: A | Discharge status: Home / Self Care | Payer: Managed Care, Other (non HMO) | Source: Ambulatory Visit | Attending: Obstetrics & Gynecology | Admitting: Obstetrics & Gynecology

## 2018-08-27 ENCOUNTER — Encounter: Payer: Self-pay | Admitting: Family Medicine

## 2018-08-27 ENCOUNTER — Ambulatory Visit (INDEPENDENT_AMBULATORY_CARE_PROVIDER_SITE_OTHER): Payer: Managed Care, Other (non HMO) | Admitting: Obstetrics & Gynecology

## 2018-08-27 VITALS — BP 120/79 | HR 95 | Wt 212.8 lb

## 2018-08-27 DIAGNOSIS — O0990 Supervision of high risk pregnancy, unspecified, unspecified trimester: Secondary | ICD-10-CM

## 2018-08-27 DIAGNOSIS — O0992 Supervision of high risk pregnancy, unspecified, second trimester: Secondary | ICD-10-CM

## 2018-08-27 DIAGNOSIS — R748 Abnormal levels of other serum enzymes: Secondary | ICD-10-CM

## 2018-08-27 DIAGNOSIS — O211 Hyperemesis gravidarum with metabolic disturbance: Secondary | ICD-10-CM

## 2018-08-27 NOTE — Progress Notes (Signed)
   PRENATAL VISIT NOTE  Subjective:  Tammy Andrews is a 28 y.o. G3P0110 at 780w4d being seen today for ongoing prenatal care.  She is currently monitored for the following issues for this high-risk pregnancy and has Chronic hypertension during pregnancy, antepartum; Hyperemesis gravidarum before end of [redacted] week gestation with dehydration; History of fetal demise, not currently pregnant; Subchorionic hemorrhage in second trimester; Supervision of high risk pregnancy, antepartum; Constipation during pregnancy; Back pain affecting pregnancy; Marijuana abuse; H/O bariatric surgery; Anemia in pregnancy; and Vaginal bleeding during pregnancy on their problem list.  Patient reports bleeding, nausea and vomiting.  Contractions: Not present. Vag. Bleeding: None.  Movement: Absent. Denies leaking of fluid.   The following portions of the patient's history were reviewed and updated as appropriate: allergies, current medications, past family history, past medical history, past social history, past surgical history and problem list. Problem list updated.  Objective:   Vitals:   08/27/18 1126  BP: 120/79  Pulse: 95  Weight: 212 lb 12.8 oz (96.5 kg)    Fetal Status: Fetal Heart Rate (bpm): 150   Movement: Absent     General:  Alert, oriented and cooperative. Patient is in no acute distress.  Skin: Skin is warm and dry. No rash noted.   Cardiovascular: Normal heart rate noted  Respiratory: Normal respiratory effort, no problems with respiration noted  Abdomen: Soft, gravid, appropriate for gestational age.  Pain/Pressure: Present     Pelvic: Cervical exam deferred        Extremities: Normal range of motion.  Edema: None  Mental Status: Normal mood and affect. Normal behavior. Normal judgment and thought content.   Assessment and Plan:  Pregnancy: G3P0110 at 2380w4d  1. Supervision of high risk pregnancy, antepartum Pap done today. Light spotting continues due to subchorionic hemorrhage - Cytology -  PAP( [facility])  2. Hyperemesis gravidarum before end of [redacted] week gestation with dehydration Still emesis 2/day and her employer won't let her work after vomiting. Out of work until f/u  Preterm labor symptoms and general obstetric precautions including but not limited to vaginal bleeding, contractions, leaking of fluid and fetal movement were reviewed in detail with the patient. Please refer to After Visit Summary for other counseling recommendations.  Return in about 2 weeks (around 09/10/2018).  Future Appointments  Date Time Provider Department Center  08/31/2018  9:35 AM [place] BingPickens, Charlie, MD [facility]  09/06/2018  [facility]00 PM [facility]  09/07/2018 11:15 AM [place]ene[place] AbbotPhillip, Nimeka, MD [facility] WOC  09/13/2018  9:00 AM [facility]-MFC US 3 WH-MFCUS MFC-US  09/13/2018 10:00 AM [facility] CLIN[facility] WOC  09/13/2018 11:15 AM Allie Bossierove, Myra C, MD [facility]    Scheryl DarterJames , MD

## 2018-08-27 NOTE — Patient Instructions (Signed)
Hyperemesis Gravidarum  Hyperemesis gravidarum is a severe form of nausea and vomiting that happens during pregnancy. Hyperemesis is worse than morning sickness. It may cause you to have nausea or vomiting all day for many days. It may keep you from eating and drinking enough food and liquids, which can lead to dehydration, malnutrition, and weight loss. Hyperemesis usually occurs during the first half (the first 20 weeks) of pregnancy. It often goes away once a woman is in her second half of pregnancy. However, sometimes hyperemesis continues through an entire pregnancy.  What are the causes?  The cause of this condition is not known. It may be related to changes in chemicals (hormones) in the body during pregnancy, such as the high level of pregnancy hormone (human chorionic gonadotropin) or the increase in the female sex hormone (estrogen).  What are the signs or symptoms?  Symptoms of this condition include:  Nausea that does not go away.  Vomiting that does not allow you to keep any food down.  Weight loss.  Body fluid loss (dehydration).  Having no desire to eat, or not liking food that you have previously enjoyed.  How is this diagnosed?  This condition may be diagnosed based on:  A physical exam.  Your medical history.  Your symptoms.  Blood tests.  Urine tests.  How is this treated?  This condition is managed by controlling symptoms. This may include:  Following an eating plan. This can help lessen nausea and vomiting.  Taking prescription medicines.  An eating plan and medicines are often used together to help control symptoms. If medicines do not help relieve nausea and vomiting, you may need to receive fluids through an IV at the hospital.  Follow these instructions at home:  Eating and drinking    Avoid the following:  Drinking fluids with meals. Try not to drink anything during the 30 minutes before and after your meals.  Drinking more than 1 cup of fluid at a time.  Eating foods that trigger your  symptoms. These may include spicy foods, coffee, high-fat foods, very sweet foods, and acidic foods.  Skipping meals. Nausea can be more intense on an empty stomach. If you cannot tolerate food, do not force it. Try sucking on ice chips or other frozen items and make up for missed calories later.  Lying down within 2 hours after eating.  Being exposed to environmental triggers. These may include food smells, smoky rooms, closed spaces, rooms with strong smells, warm or humid places, overly loud and noisy rooms, and rooms with motion or flickering lights. Try eating meals in a well-ventilated area that is free of strong smells.  Quick and sudden changes in your movement.  Taking iron pills and multivitamins that contain iron. If you take prescription iron pills, do not stop taking them unless your health care provider approves.  Preparing food. The smell of food can spoil your appetite or trigger nausea.  To help relieve your symptoms:  Listen to your body. Everyone is different and has different preferences. Find what works best for you.  Eat and drink slowly.  Eat 5-6 small meals daily instead of 3 large meals. Eating small meals and snacks can help you avoid an empty stomach.  In the morning, before getting out of bed, eat a couple of crackers to avoid moving around on an empty stomach.  Try eating starchy foods as these are usually tolerated well. Examples include cereal, toast, bread, potatoes, pasta, rice, and pretzels.  Include at   least 1 serving of protein with your meals and snacks. Protein options include lean meats, poultry, seafood, beans, nuts, nut butters, eggs, cheese, and yogurt.  Try eating a protein-rich snack before bed. Examples of a protein-rick snack include cheese and crackers or a peanut butter sandwich made with 1 slice of whole-wheat bread and 1 tsp (5 g) of peanut butter.  Eat or suck on things that have ginger in them. It may help relieve nausea. Add  tsp ground ginger to hot tea or  choose ginger tea.  Try drinking 100% fruit juice or an electrolyte drink. An electrolyte drink contains sodium, potassium, and chloride.  Drink fluids that are cold, clear, and carbonated or sour. Examples include lemonade, ginger ale, lemon-lime soda, ice water, and sparkling water.  Brush your teeth or use a mouth rinse after meals.  Talk with your health care provider about starting a supplement of vitamin B6.  General instructions  Take over-the-counter and prescription medicines only as told by your health care provider.  Follow instructions from your health care provider about eating or drinking restrictions.  Continue to take your prenatal vitamins as told by your health care provider. If you are having trouble taking your prenatal vitamins, talk with your health care provider about different options.  Keep all follow-up and pre-birth (prenatal) visits as told by your health care provider. This is important.  Contact a health care provider if:  You have pain in your abdomen.  You have a severe headache.  You have vision problems.  You are losing weight.  You feel weak or dizzy.  Get help right away if:  You cannot drink fluids without vomiting.  You vomit blood.  You have constant nausea and vomiting.  You are very weak.  You faint.  You have a fever and your symptoms suddenly get worse.  Summary  Hyperemesis gravidarum is a severe form of nausea and vomiting that happens during pregnancy.  Making some changes to your eating habits may help relieve nausea and vomiting.  This condition may be managed with medicine.  If medicines do not help relieve nausea and vomiting, you may need to receive fluids through an IV at the hospital.  This information is not intended to replace advice given to you by your health care provider. Make sure you discuss any questions you have with your health care provider.  Document Released: 07/04/2005 Document Revised: 07/24/2017 Document Reviewed: 03/02/2016  Elsevier Interactive  Patient Education  2019 [employer]

## 2018-08-28 LAB — CYTOLOGY - PAP
Chlamydia: NEGATIVE
Diagnosis: NEGATIVE
Neisseria Gonorrhea: NEGATIVE

## 2018-08-29 DIAGNOSIS — Z029 Encounter for administrative examinations, unspecified: Secondary | ICD-10-CM

## 2018-08-31 ENCOUNTER — Encounter: Payer: Medicaid Other | Admitting: Obstetrics and Gynecology

## 2018-09-03 ENCOUNTER — Encounter: Payer: Self-pay | Admitting: *Deleted

## 2018-09-04 ENCOUNTER — Telehealth: Payer: Self-pay | Admitting: *Deleted

## 2018-09-04 NOTE — Telephone Encounter (Signed)
Called pt and left message stating that we had received a fax from her pharmacy requesting refill of Klor 15 meq. Per Dr. Vergie Living, she no longer needs this medication. If she has questions, she may discuss @ her appt later this week on 2/21.

## 2018-09-06 ENCOUNTER — Ambulatory Visit: Payer: Self-pay

## 2018-09-06 ENCOUNTER — Encounter (HOSPITAL_COMMUNITY): Payer: Self-pay

## 2018-09-07 ENCOUNTER — Encounter: Payer: Self-pay | Admitting: Family Medicine

## 2018-09-12 ENCOUNTER — Encounter (HOSPITAL_COMMUNITY): Payer: Self-pay

## 2018-09-12 NOTE — BH Specialist Note (Deleted)
Pt declined visit today

## 2018-09-13 ENCOUNTER — Encounter (HOSPITAL_COMMUNITY): Payer: Self-pay

## 2018-09-13 ENCOUNTER — Ambulatory Visit (INDEPENDENT_AMBULATORY_CARE_PROVIDER_SITE_OTHER): Payer: Managed Care, Other (non HMO) | Admitting: Obstetrics & Gynecology

## 2018-09-13 ENCOUNTER — Ambulatory Visit (HOSPITAL_COMMUNITY)
Admission: RE | Admit: 2018-09-13 | Discharge: 2018-09-13 | Disposition: A | Discharge status: Home / Self Care | Payer: Managed Care, Other (non HMO) | Source: Ambulatory Visit | Attending: Advanced Practice Midwife | Admitting: Advanced Practice Midwife

## 2018-09-13 ENCOUNTER — Other Ambulatory Visit: Payer: Self-pay | Admitting: Advanced Practice Midwife

## 2018-09-13 ENCOUNTER — Ambulatory Visit (HOSPITAL_BASED_OUTPATIENT_CLINIC_OR_DEPARTMENT_OTHER): Payer: Managed Care, Other (non HMO) | Admitting: *Deleted

## 2018-09-13 VITALS — BP 137/67 | HR 93 | Wt 217.1 lb

## 2018-09-13 DIAGNOSIS — O99212 Obesity complicating pregnancy, second trimester: Secondary | ICD-10-CM

## 2018-09-13 DIAGNOSIS — R748 Abnormal levels of other serum enzymes: Secondary | ICD-10-CM

## 2018-09-13 DIAGNOSIS — K5900 Constipation, unspecified: Secondary | ICD-10-CM | POA: Diagnosis not present

## 2018-09-13 DIAGNOSIS — O10912 Unspecified pre-existing hypertension complicating pregnancy, second trimester: Secondary | ICD-10-CM

## 2018-09-13 DIAGNOSIS — M549 Dorsalgia, unspecified: Secondary | ICD-10-CM | POA: Insufficient documentation

## 2018-09-13 DIAGNOSIS — O99612 Diseases of the digestive system complicating pregnancy, second trimester: Secondary | ICD-10-CM | POA: Diagnosis not present

## 2018-09-13 DIAGNOSIS — O10012 Pre-existing essential hypertension complicating pregnancy, second trimester: Secondary | ICD-10-CM

## 2018-09-13 DIAGNOSIS — Z3A19 19 weeks gestation of pregnancy: Secondary | ICD-10-CM

## 2018-09-13 DIAGNOSIS — O09292 Supervision of pregnancy with other poor reproductive or obstetric history, second trimester: Secondary | ICD-10-CM | POA: Diagnosis not present

## 2018-09-13 DIAGNOSIS — Z3480 Encounter for supervision of other normal pregnancy, unspecified trimester: Secondary | ICD-10-CM | POA: Insufficient documentation

## 2018-09-13 DIAGNOSIS — Z9884 Bariatric surgery status: Secondary | ICD-10-CM

## 2018-09-13 DIAGNOSIS — O99322 Drug use complicating pregnancy, second trimester: Secondary | ICD-10-CM | POA: Diagnosis not present

## 2018-09-13 DIAGNOSIS — O10919 Unspecified pre-existing hypertension complicating pregnancy, unspecified trimester: Secondary | ICD-10-CM

## 2018-09-13 DIAGNOSIS — O0990 Supervision of high risk pregnancy, unspecified, unspecified trimester: Secondary | ICD-10-CM

## 2018-09-13 DIAGNOSIS — O364XX0 Maternal care for intrauterine death, not applicable or unspecified: Secondary | ICD-10-CM

## 2018-09-13 DIAGNOSIS — O99210 Obesity complicating pregnancy, unspecified trimester: Secondary | ICD-10-CM

## 2018-09-13 DIAGNOSIS — O9989 Other specified diseases and conditions complicating pregnancy, childbirth and the puerperium: Secondary | ICD-10-CM | POA: Diagnosis not present

## 2018-09-13 DIAGNOSIS — O4592 Premature separation of placenta, unspecified, second trimester: Secondary | ICD-10-CM

## 2018-09-13 DIAGNOSIS — Z3686 Encounter for antenatal screening for cervical length: Secondary | ICD-10-CM

## 2018-09-13 DIAGNOSIS — O0992 Supervision of high risk pregnancy, unspecified, second trimester: Secondary | ICD-10-CM

## 2018-09-13 DIAGNOSIS — O99891 Other specified diseases and conditions complicating pregnancy: Secondary | ICD-10-CM

## 2018-09-13 DIAGNOSIS — Z362 Encounter for other antenatal screening follow-up: Secondary | ICD-10-CM

## 2018-09-13 DIAGNOSIS — O99012 Anemia complicating pregnancy, second trimester: Secondary | ICD-10-CM

## 2018-09-13 NOTE — Progress Notes (Signed)
Pt declined seeing [facility]. Pt stated "I don't need to see her".

## 2018-09-14 ENCOUNTER — Other Ambulatory Visit (HOSPITAL_COMMUNITY): Payer: Self-pay | Admitting: *Deleted

## 2018-09-14 DIAGNOSIS — Z362 Encounter for other antenatal screening follow-up: Secondary | ICD-10-CM

## 2018-09-15 LAB — AFP TETRA
DIA Mom Value: 1.37
DIA Value (EIA): 193.85 pg/mL
DSR (By Age)    1 IN: 871
DSR (Second Trimester) 1 IN: 2203
Gestational Age: 19 WEEKS
MSAFP MOM: 1.62
MSAFP: 70.4 ng/mL
MSHCG Mom: 1.12
MSHCG: 22590 m[IU]/mL
Maternal Age At EDD: 27.8 yr
Osb Risk: 3989
T18 (By Age): 1:3393 {titer}
Test Results:: NEGATIVE
Weight: 217 [lb_av]
uE3 Mom: 0.69
uE3 Value: 1.04 ng/mL

## 2018-09-15 LAB — LIPASE: Lipase: 36 U/L (ref 14–72)

## 2018-09-15 LAB — AMYLASE: Amylase: 143 U/L — ABNORMAL HIGH (ref 31–110)

## 2018-09-15 LAB — HEMOGLOBIN A1C
Est. average glucose Bld gHb Est-mCnc: 91 mg/dL
HEMOGLOBIN A1C: 4.8 % (ref 4.8–5.6)

## 2018-09-21 ENCOUNTER — Encounter: Payer: Self-pay | Admitting: Obstetrics & Gynecology

## 2018-09-27 ENCOUNTER — Encounter: Payer: Self-pay | Admitting: Obstetrics and Gynecology

## 2018-09-27 ENCOUNTER — Ambulatory Visit (INDEPENDENT_AMBULATORY_CARE_PROVIDER_SITE_OTHER): Payer: Managed Care, Other (non HMO) | Admitting: Obstetrics and Gynecology

## 2018-09-27 ENCOUNTER — Other Ambulatory Visit: Payer: Self-pay

## 2018-09-27 VITALS — BP 127/80 | HR 88 | Wt 225.8 lb

## 2018-09-27 DIAGNOSIS — O0990 Supervision of high risk pregnancy, unspecified, unspecified trimester: Secondary | ICD-10-CM

## 2018-09-27 DIAGNOSIS — O468X2 Other antepartum hemorrhage, second trimester: Secondary | ICD-10-CM

## 2018-09-27 DIAGNOSIS — O4442 Low lying placenta NOS or without hemorrhage, second trimester: Secondary | ICD-10-CM

## 2018-09-27 DIAGNOSIS — O4692 Antepartum hemorrhage, unspecified, second trimester: Secondary | ICD-10-CM

## 2018-09-27 DIAGNOSIS — Z8759 Personal history of other complications of pregnancy, childbirth and the puerperium: Secondary | ICD-10-CM

## 2018-09-27 DIAGNOSIS — O211 Hyperemesis gravidarum with metabolic disturbance: Secondary | ICD-10-CM

## 2018-09-27 DIAGNOSIS — O4690 Antepartum hemorrhage, unspecified, unspecified trimester: Secondary | ICD-10-CM

## 2018-09-27 DIAGNOSIS — IMO0001 Reserved for inherently not codable concepts without codable children: Secondary | ICD-10-CM

## 2018-09-27 DIAGNOSIS — K5900 Constipation, unspecified: Secondary | ICD-10-CM

## 2018-09-27 DIAGNOSIS — O99612 Diseases of the digestive system complicating pregnancy, second trimester: Secondary | ICD-10-CM

## 2018-09-27 DIAGNOSIS — O10919 Unspecified pre-existing hypertension complicating pregnancy, unspecified trimester: Secondary | ICD-10-CM

## 2018-09-27 DIAGNOSIS — Z3A21 21 weeks gestation of pregnancy: Secondary | ICD-10-CM

## 2018-09-27 DIAGNOSIS — O99012 Anemia complicating pregnancy, second trimester: Secondary | ICD-10-CM

## 2018-09-27 DIAGNOSIS — O418X20 Other specified disorders of amniotic fluid and membranes, second trimester, not applicable or unspecified: Secondary | ICD-10-CM

## 2018-09-27 DIAGNOSIS — R748 Abnormal levels of other serum enzymes: Secondary | ICD-10-CM

## 2018-09-27 DIAGNOSIS — O10912 Unspecified pre-existing hypertension complicating pregnancy, second trimester: Secondary | ICD-10-CM

## 2018-09-27 DIAGNOSIS — Z9884 Bariatric surgery status: Secondary | ICD-10-CM

## 2018-09-27 DIAGNOSIS — O4440 Low lying placenta NOS or without hemorrhage, unspecified trimester: Secondary | ICD-10-CM

## 2018-09-27 LAB — POCT URINALYSIS DIP (DEVICE)
Glucose, UA: NEGATIVE mg/dL
Ketones, ur: NEGATIVE mg/dL
Leukocytes,Ua: NEGATIVE
Nitrite: NEGATIVE
Protein, ur: >=300 mg/dL — AB
Urobilinogen, UA: 1 mg/dL (ref 0.0–1.0)
pH: 6.5 (ref 5.0–8.0)

## 2018-09-27 MED ORDER — PANTOPRAZOLE SODIUM 40 MG PO TBEC: 40.0000 mg | DELAYED_RELEASE_TABLET | Freq: Every day | ORAL | 1 refills | Status: DC

## 2018-09-27 NOTE — Progress Notes (Signed)
Prenatal Visit Note Date: 09/27/2018 Clinic: [facility]  Subjective:  Tammy Andrews is a 28 y.o. G3P0110 at [redacted]w[redacted]d being seen today for ongoing prenatal care.  She is currently monitored for the following issues for this high-risk pregnancy and has Chronic hypertension during pregnancy, antepartum; Hyperemesis gravidarum before end of [redacted] week gestation with dehydration; History of fetal demise, not currently pregnant; Subchorionic hemorrhage in second trimester; Supervision of high risk pregnancy, antepartum; Constipation during pregnancy; Back pain affecting pregnancy; Marijuana abuse; H/O bariatric surgery; Anemia in pregnancy; Vaginal bleeding during pregnancy; Elevated amylase; Fetal demise in singleton pregnancy greater than [redacted] weeks gestation, antepartum; and Low-lying placenta on their problem list.  Patient reports no complaints.   Contractions: Not present. Vag. Bleeding: None.  Movement: Absent. Denies leaking of fluid.   The following portions of the patient's history were reviewed and updated as appropriate: allergies, current medications, past family history, past medical history, past social history, past surgical history and problem list. Problem list updated.  Objective:   Vitals:   09/27/18 1052  BP: 127/80  Pulse: 88  Weight: 225 lb 12.8 oz (102.4 kg)    Fetal Status: Fetal Heart Rate (bpm): 140   Movement: Absent     General:  Alert, oriented and cooperative. Patient is in no acute distress.  Skin: Skin is warm and dry. No rash noted.   Cardiovascular: Normal heart rate noted  Respiratory: Normal respiratory effort, no problems with respiration noted  Abdomen: Soft, gravid, appropriate for gestational age. Pain/Pressure: Absent     Pelvic:  Cervical exam deferred        Extremities: Normal range of motion.  Edema: None  Mental Status: Normal mood and affect. Normal behavior. Normal judgment and thought content.   Urinalysis:      Assessment  and Plan:  Pregnancy: G3P0110 at [redacted]w[redacted]d  1. Elevated amylase Repeat nv or at 28wks  2. Supervision of high risk pregnancy, antepartum Routine care. Doing well.  3. Chronic hypertension during pregnancy, antepartum Doing wellon no meds. Rpt growth u/s in 2wks  4. Hyperemesis gravidarum before end of [redacted] week gestation with dehydration Good weight gain. Pt feels well. On phenergan po 1-2x/day, protonix qday and amitrypline.   5. Vaginal bleeding during pregnancy None for several weeks  6. History of fetal demise, not currently pregnant Unable to get records. Continue to follow closely  7. Subchorionic hemorrhage of placenta in second trimester, single or unspecified fetus  8. Constipation during pregnancy in second trimester resolved  9. H/O bariatric surgery  10. Anemia during pregnancy in second trimester  11. Low-lying placenta Pt aware. Pelvic rest until advised otherwise  Preterm labor symptoms and general obstetric precautions including but not limited to vaginal bleeding, contractions, leaking of fluid and fetal movement were reviewed in detail with the patient. Please refer to After Visit Summary for other counseling recommendations.  Return in about 3 weeks (around 10/18/2018) for hrob .    Bing, MD

## 2018-10-09 ENCOUNTER — Encounter: Payer: Self-pay | Admitting: *Deleted

## 2018-10-11 ENCOUNTER — Ambulatory Visit (INDEPENDENT_AMBULATORY_CARE_PROVIDER_SITE_OTHER): Payer: Managed Care, Other (non HMO) | Admitting: Obstetrics and Gynecology

## 2018-10-11 ENCOUNTER — Encounter: Payer: Self-pay | Admitting: Obstetrics and Gynecology

## 2018-10-11 ENCOUNTER — Ambulatory Visit (HOSPITAL_COMMUNITY): Payer: Managed Care, Other (non HMO)

## 2018-10-11 ENCOUNTER — Ambulatory Visit (HOSPITAL_COMMUNITY): Admission: RE | Admit: 2018-10-11 | Payer: Managed Care, Other (non HMO) | Source: Ambulatory Visit

## 2018-10-11 ENCOUNTER — Telehealth: Payer: Self-pay | Admitting: Obstetrics and Gynecology

## 2018-10-11 DIAGNOSIS — Z8759 Personal history of other complications of pregnancy, childbirth and the puerperium: Secondary | ICD-10-CM

## 2018-10-11 DIAGNOSIS — Z3A23 23 weeks gestation of pregnancy: Secondary | ICD-10-CM

## 2018-10-11 DIAGNOSIS — O10919 Unspecified pre-existing hypertension complicating pregnancy, unspecified trimester: Secondary | ICD-10-CM

## 2018-10-11 DIAGNOSIS — O10913 Unspecified pre-existing hypertension complicating pregnancy, third trimester: Secondary | ICD-10-CM

## 2018-10-11 DIAGNOSIS — O0990 Supervision of high risk pregnancy, unspecified, unspecified trimester: Secondary | ICD-10-CM

## 2018-10-11 MED ORDER — PROMETHAZINE HCL 25 MG PO TABS: 25.0000 mg | ORAL_TABLET | Freq: Four times a day (QID) | ORAL | 0 refills | Status: DC

## 2018-10-11 NOTE — Progress Notes (Signed)
I connected with  Tammy Andrews on 10/11/18 at  8:35 AM EDT by telephone and verified that I am speaking with the correct person using two identifiers.   I discussed the limitations, risks, security and privacy concerns of performing an evaluation and management service by telephone and the availability of in person appointments. I also discussed with the patient that there may be a patient responsible charge related to this service. The patient expressed understanding and agreed to proceed.  [name], RN 10/11/2018  8:55 AM   Pt reports she is already signed up and using baby scripts.  Pt reports that she does not have access to a BP cuff.  Pt instructed to stop by the entrance of the office to get the cuff.  Pt verbalized understanding.

## 2018-10-11 NOTE — Addendum Note (Signed)
Addended by: Kathee Delton on: 10/11/2018 09:35 AM   Modules accepted: Orders

## 2018-10-11 NOTE — Telephone Encounter (Signed)
Attempted to call patient with her next appointment in 4 weeks. No answer, lvm with appointment time and date. Also sent reminder

## 2018-10-11 NOTE — Progress Notes (Signed)
   TELEHEALTH VIRTUAL OBSTETRICS VISIT ENCOUNTER NOTE  I connected with Tammy Andrews on 10/11/18 at  8:35 AM EDT by telephone at home and verified that I am speaking with the correct person using two identifiers.   I discussed the limitations, risks, security and privacy concerns of performing an evaluation and management service by telephone and the availability of in person appointments. I also discussed with the patient that there may be a patient responsible charge related to this service. The patient expressed understanding and agreed to proceed.  Subjective:  Tammy Andrews is a 28 y.o. G3P0110 at [redacted]w[redacted]d being followed for ongoing prenatal care.  She is currently monitored for the following issues for this high-risk pregnancy and has Chronic hypertension during pregnancy, antepartum; Hyperemesis gravidarum before end of [redacted] week gestation with dehydration; History of fetal demise, not currently pregnant; Subchorionic hemorrhage in second trimester; Supervision of high risk pregnancy, antepartum; Constipation during pregnancy; Back pain affecting pregnancy; Marijuana abuse; H/O bariatric surgery; Anemia in pregnancy; Vaginal bleeding during pregnancy; Elevated amylase; Fetal demise in singleton pregnancy greater than [redacted] weeks gestation, antepartum; and Low-lying placenta on their problem list.  Patient reports no complaints. Reports fetal movement. Denies any contractions, bleeding or leaking of fluid.   The following portions of the patient's history were reviewed and updated as appropriate: allergies, current medications, past family history, past medical history, past social history, past surgical history and problem list.   Objective:   General:  Alert, oriented and cooperative.   Mental Status: Normal mood and affect perceived. Normal judgment and thought content.  Rest of physical exam deferred due to type of encounter  Assessment and Plan:  Pregnancy: G3P0110 at [redacted]w[redacted]d 1.  Supervision of high risk pregnancy, antepartum Patient is doing well without complaints Third trimester labs next visit Follow up growth ultrasound 4/23 Patient requesting refill on phenergan provided  2. Chronic hypertension during pregnancy, antepartum Currently not on any medication Continue ASA Patient will be provided with a BP cuff  3. History of fetal demise, not currently pregnant Will plan to start antenatal testing at 32 weeks   Preterm labor symptoms and general obstetric precautions including but not limited to vaginal bleeding, contractions, leaking of fluid and fetal movement were reviewed in detail with the patient.  I discussed the assessment and treatment plan with the patient. The patient was provided an opportunity to ask questions and all were answered. The patient agreed with the plan and demonstrated an understanding of the instructions. The patient was advised to call back or seek an in-person office evaluation/go to MAU at [facility] for any urgent or concerning symptoms. Please refer to After Visit Summary for other counseling recommendations.   Return in about 4 weeks (around 11/08/2018) for ROB, 2 hr glucola next visit.  Future Appointments  Date Time Provider Department Center  11/08/2018  8:30 AM [facility] NURSE [facility]  11/08/2018  8:30 AM [facility] [place] 1 [facility]    Catalina Antigua, MD [facility] [employer], [facility]

## 2018-10-16 ENCOUNTER — Other Ambulatory Visit: Payer: Self-pay

## 2018-10-16 ENCOUNTER — Encounter (HOSPITAL_COMMUNITY): Payer: Self-pay | Admitting: *Deleted

## 2018-10-16 ENCOUNTER — Inpatient Hospital Stay (HOSPITAL_COMMUNITY)
Admission: AD | Admit: 2018-10-16 | Discharge: 2018-10-16 | Disposition: A | Discharge status: Home / Self Care | Payer: Managed Care, Other (non HMO) | Source: Ambulatory Visit | Attending: Obstetrics & Gynecology | Admitting: Obstetrics & Gynecology

## 2018-10-16 DIAGNOSIS — O99612 Diseases of the digestive system complicating pregnancy, second trimester: Secondary | ICD-10-CM

## 2018-10-16 DIAGNOSIS — O364XX0 Maternal care for intrauterine death, not applicable or unspecified: Secondary | ICD-10-CM

## 2018-10-16 DIAGNOSIS — O0990 Supervision of high risk pregnancy, unspecified, unspecified trimester: Secondary | ICD-10-CM

## 2018-10-16 DIAGNOSIS — Z91018 Allergy to other foods: Secondary | ICD-10-CM | POA: Diagnosis not present

## 2018-10-16 DIAGNOSIS — M545 Low back pain: Secondary | ICD-10-CM | POA: Diagnosis not present

## 2018-10-16 DIAGNOSIS — Z8751 Personal history of pre-term labor: Secondary | ICD-10-CM | POA: Diagnosis not present

## 2018-10-16 DIAGNOSIS — O9989 Other specified diseases and conditions complicating pregnancy, childbirth and the puerperium: Secondary | ICD-10-CM | POA: Diagnosis present

## 2018-10-16 DIAGNOSIS — Z3A23 23 weeks gestation of pregnancy: Secondary | ICD-10-CM | POA: Diagnosis not present

## 2018-10-16 DIAGNOSIS — M549 Dorsalgia, unspecified: Secondary | ICD-10-CM | POA: Diagnosis not present

## 2018-10-16 DIAGNOSIS — Z833 Family history of diabetes mellitus: Secondary | ICD-10-CM | POA: Diagnosis not present

## 2018-10-16 DIAGNOSIS — O99342 Other mental disorders complicating pregnancy, second trimester: Secondary | ICD-10-CM | POA: Diagnosis not present

## 2018-10-16 DIAGNOSIS — O212 Late vomiting of pregnancy: Secondary | ICD-10-CM | POA: Diagnosis not present

## 2018-10-16 DIAGNOSIS — Z87891 Personal history of nicotine dependence: Secondary | ICD-10-CM | POA: Insufficient documentation

## 2018-10-16 DIAGNOSIS — F419 Anxiety disorder, unspecified: Secondary | ICD-10-CM | POA: Insufficient documentation

## 2018-10-16 DIAGNOSIS — O99891 Other specified diseases and conditions complicating pregnancy: Secondary | ICD-10-CM

## 2018-10-16 DIAGNOSIS — K5900 Constipation, unspecified: Secondary | ICD-10-CM

## 2018-10-16 LAB — URINALYSIS, ROUTINE W REFLEX MICROSCOPIC
Bilirubin Urine: NEGATIVE
Glucose, UA: NEGATIVE mg/dL
Ketones, ur: NEGATIVE mg/dL
Leukocytes,Ua: NEGATIVE
NITRITE: NEGATIVE
PH: 6 (ref 5.0–8.0)
Protein, ur: 100 mg/dL — AB
Specific Gravity, Urine: 1.028 (ref 1.005–1.030)

## 2018-10-16 MED ORDER — COMFORT FIT MATERNITY SUPP SM MISC: 1.0000 [IU] | Freq: Every day | 0 refills | Status: DC | PRN

## 2018-10-16 MED ORDER — ACETAMINOPHEN 500 MG PO TABS: 1000.0000 mg | ORAL_TABLET | Freq: Once | ORAL | Status: DC

## 2018-10-16 MED ORDER — ACETAMINOPHEN 500 MG PO TABS
1000.0000 mg | ORAL_TABLET | Freq: Once | ORAL | Status: AC
  Administered 2018-10-16: 1000 mg via ORAL
  Filled 2018-10-16: qty 2

## 2018-10-16 NOTE — MAU Provider Note (Signed)
History     CSN: [id number]  Arrival date and time: 10/16/18 2952   First Provider Initiated Contact with Patient 10/16/18 [phone]      Chief Complaint  Patient presents with  . Back Pain  . Emesis   HPI  Ms.  Tammy Andrews is a 28 y.o. year old G60P0110 female at [redacted]w[redacted]d weeks gestation who presents to MAU reporting lower back pain that radiates to her lower abdomen, vomiting with increased pain. She reports that the pain is all intermittent. She has cannabis hyperemesis, but denies any recent THC use. She admits to using [facility] in order to be able to eat. She takes Protonix and Phenergan for the cyclic vomiting. She also complains of "never feeling the baby move."   Past Medical History:  Diagnosis Date  . Anxiety   . Asthma    childhood  . Depression   . Preterm labor     Past Surgical History:  Procedure Laterality Date  . DILATION AND CURETTAGE OF UTERUS    . LAPAROSCOPIC GASTRIC SLEEVE RESECTION  08/2014    Family History  Problem Relation Age of Onset  . Hypertension Father   . Diabetes Paternal Grandmother     Social History   Tobacco Use  . Smoking status: Former Smoker    Last attempt to quit: 2018    Years since quitting: 2.2  . Smokeless tobacco: Never Used  Substance Use Topics  . Alcohol use: Not Currently    Comment: not in pregnancy  . Drug use: Not Currently    Types: Marijuana    Comment: last used 08-01-18    Allergies:  Allergies  Allergen Reactions  . Pork-Derived Products     No medications prior to admission.    Review of Systems  Constitutional: Negative.   HENT: Negative.   Eyes: Negative.   Respiratory: Negative.   Cardiovascular: Negative.   Gastrointestinal: Positive for nausea and vomiting (with increased pain).  Endocrine: Negative.   Genitourinary: Positive for pelvic pain (radiates from back ).  Musculoskeletal: Positive for back pain.  Skin: Negative.   Allergic/Immunologic: Negative.   Neurological: Negative.    Hematological: Negative.   Psychiatric/Behavioral: Negative.    Physical Exam   Blood pressure (!) 147/92, pulse 89, temperature 98.3 F (36.8 C), temperature source Oral, resp. rate 18, height 5\' 6"  (1.676 m), weight 104.3 kg, last menstrual period 04/19/2018.  Physical Exam  Nursing note and vitals reviewed. Constitutional: She is oriented to person, place, and time. She appears well-developed and well-nourished.  HENT:  Head: Normocephalic and atraumatic.  Eyes: Pupils are equal, round, and reactive to light.  Neck: Normal range of motion.  Cardiovascular: Normal rate.  Respiratory: Effort normal and breath sounds normal.  GI: Soft. Bowel sounds are normal.  Musculoskeletal: Normal range of motion.  Neurological: She is alert and oriented to person, place, and time.  Skin: Skin is warm and dry.  Psychiatric: She has a normal mood and affect. Her behavior is normal. Judgment and thought content normal.    MAU Course  Procedures  MDM CCUA Tylenol 1000 mg prior to d/c home Maternity belt Rx for d/c home  Results for orders placed or performed during the hospital encounter of 10/16/18 (from the past 24 hour(s))  Urinalysis, Routine w reflex microscopic     Status: Abnormal   Collection Time: 10/16/18  9:04 AM  Result Value Ref Range   Color, Urine YELLOW YELLOW   APPearance HAZY (A) CLEAR   Specific Gravity,  Urine 1.028 1.005 - 1.030   pH 6.0 5.0 - 8.0   Glucose, UA NEGATIVE NEGATIVE mg/dL   Hgb urine dipstick SMALL (A) NEGATIVE   Bilirubin Urine NEGATIVE NEGATIVE   Ketones, ur NEGATIVE NEGATIVE mg/dL   Protein, ur 165 (A) NEGATIVE mg/dL   Nitrite NEGATIVE NEGATIVE   Leukocytes,Ua NEGATIVE NEGATIVE   RBC / HPF 11-20 0 - 5 RBC/hpf   WBC, UA 0-5 0 - 5 WBC/hpf   Bacteria, UA RARE (A) NONE SEEN   Squamous Epithelial / LPF 0-5 0 - 5   Mucus PRESENT     Assessment and Plan  Back pain affecting pregnancy in second trimester  - Information provided on back pain in  pregnancy - Advised to take Tylenol 1000 mg every 6 hrs prn pain - Rx for maternity belt given and advised to pick up from [facility] (address on Rx)  - Discharge home - Keep scheduled appt with [facility] on 11/09/2018 - Patient verbalized an understanding of the plan of care and agrees.     Raelyn Mora, MSN, CNM 10/16/2018, 11:45 AM

## 2018-10-16 NOTE — MAU Note (Signed)
Pt reports lower back pain  For a few days. Pain radiates towards the front. Pain is so bad it is making her vomit. Has had pain like this on and off during her pregnancy. Given protonix, phenregan  But not working. Pt reports she has not ever felt fetal movement.

## 2018-10-21 ENCOUNTER — Inpatient Hospital Stay (HOSPITAL_COMMUNITY): Payer: Managed Care, Other (non HMO)

## 2018-10-21 ENCOUNTER — Inpatient Hospital Stay (HOSPITAL_COMMUNITY)
Admission: AD | Admit: 2018-10-21 | Discharge: 2018-10-22 | Disposition: A | Discharge status: Home / Self Care | Payer: Managed Care, Other (non HMO) | Attending: Obstetrics & Gynecology | Admitting: Obstetrics & Gynecology

## 2018-10-21 ENCOUNTER — Encounter (HOSPITAL_COMMUNITY): Payer: Self-pay

## 2018-10-21 ENCOUNTER — Other Ambulatory Visit: Payer: Self-pay

## 2018-10-21 DIAGNOSIS — K5900 Constipation, unspecified: Secondary | ICD-10-CM | POA: Diagnosis not present

## 2018-10-21 DIAGNOSIS — R109 Unspecified abdominal pain: Secondary | ICD-10-CM | POA: Diagnosis not present

## 2018-10-21 DIAGNOSIS — Z87891 Personal history of nicotine dependence: Secondary | ICD-10-CM | POA: Diagnosis not present

## 2018-10-21 DIAGNOSIS — Z79899 Other long term (current) drug therapy: Secondary | ICD-10-CM | POA: Insufficient documentation

## 2018-10-21 DIAGNOSIS — O26892 Other specified pregnancy related conditions, second trimester: Secondary | ICD-10-CM | POA: Diagnosis not present

## 2018-10-21 DIAGNOSIS — O99512 Diseases of the respiratory system complicating pregnancy, second trimester: Secondary | ICD-10-CM | POA: Insufficient documentation

## 2018-10-21 DIAGNOSIS — O212 Late vomiting of pregnancy: Secondary | ICD-10-CM | POA: Diagnosis not present

## 2018-10-21 DIAGNOSIS — O99612 Diseases of the digestive system complicating pregnancy, second trimester: Secondary | ICD-10-CM | POA: Diagnosis not present

## 2018-10-21 DIAGNOSIS — Z3A24 24 weeks gestation of pregnancy: Secondary | ICD-10-CM | POA: Insufficient documentation

## 2018-10-21 DIAGNOSIS — J45909 Unspecified asthma, uncomplicated: Secondary | ICD-10-CM | POA: Diagnosis not present

## 2018-10-21 LAB — URINALYSIS, MICROSCOPIC (REFLEX)

## 2018-10-21 LAB — COMPREHENSIVE METABOLIC PANEL
ALT: 9 U/L (ref 0–44)
AST: 14 U/L — ABNORMAL LOW (ref 15–41)
Albumin: 2.9 g/dL — ABNORMAL LOW (ref 3.5–5.0)
Alkaline Phosphatase: 41 U/L (ref 38–126)
Anion gap: 8 (ref 5–15)
BUN: 10 mg/dL (ref 6–20)
CO2: 20 mmol/L — ABNORMAL LOW (ref 22–32)
Calcium: 8.8 mg/dL — ABNORMAL LOW (ref 8.9–10.3)
Chloride: 107 mmol/L (ref 98–111)
Creatinine, Ser: 0.64 mg/dL (ref 0.44–1.00)
GFR calc Af Amer: >60 mL/min (ref >60)
GFR calc non Af Amer: >60 mL/min (ref >60)
Glucose, Bld: 81 mg/dL (ref 70–99)
Potassium: 3.5 mmol/L (ref 3.5–5.1)
Sodium: 135 mmol/L (ref 135–145)
Total Bilirubin: 0.6 mg/dL (ref 0.3–1.2)
Total Protein: 6.3 g/dL — ABNORMAL LOW (ref 6.5–8.1)

## 2018-10-21 LAB — CBC
HCT: 26.7 % — ABNORMAL LOW (ref 36.0–46.0)
Hemoglobin: 9 g/dL — ABNORMAL LOW (ref 12.0–15.0)
MCH: 31.9 pg (ref 26.0–34.0)
MCHC: 33.7 g/dL (ref 30.0–36.0)
MCV: 94.7 fL (ref 80.0–100.0)
Platelets: 214 10*3/uL (ref 150–400)
RBC: 2.82 MIL/uL — ABNORMAL LOW (ref 3.87–5.11)
RDW: 12.8 % (ref 11.5–15.5)
WBC: 11.8 10*3/uL — ABNORMAL HIGH (ref 4.0–10.5)
nRBC: 0 % (ref 0.0–0.2)

## 2018-10-21 LAB — URINALYSIS, ROUTINE W REFLEX MICROSCOPIC
Glucose, UA: 100 mg/dL — AB
Ketones, ur: 40 mg/dL — AB
Leukocytes,Ua: NEGATIVE
Nitrite: NEGATIVE
Protein, ur: >300 mg/dL — AB
Specific Gravity, Urine: 1.025 (ref 1.005–1.030)
pH: 7 (ref 5.0–8.0)

## 2018-10-21 LAB — PROTEIN / CREATININE RATIO, URINE
Creatinine, Urine: 307.23 mg/dL
Protein Creatinine Ratio: 1.92 mg/mg{Cre} — ABNORMAL HIGH (ref 0.00–0.15)
Total Protein, Urine: 590 mg/dL

## 2018-10-21 MED ORDER — PROMETHAZINE HCL 25 MG/ML IJ SOLN
25.0000 mg | Freq: Once | INTRAMUSCULAR | Status: AC
  Administered 2018-10-21: 25 mg via INTRAVENOUS
  Filled 2018-10-21: qty 1

## 2018-10-21 MED ORDER — M.V.I. ADULT IV INJ
Freq: Once | INTRAVENOUS | Status: AC
  Administered 2018-10-21: 22:00:00 via INTRAVENOUS
  Filled 2018-10-21: qty 10

## 2018-10-21 MED ORDER — CYCLOBENZAPRINE HCL 10 MG PO TABS
10.0000 mg | ORAL_TABLET | Freq: Once | ORAL | Status: AC
  Administered 2018-10-21: 22:00:00 10 mg via ORAL
  Filled 2018-10-21: qty 1

## 2018-10-21 MED ORDER — LACTATED RINGERS IV BOLUS
1000.0000 mL | Freq: Once | INTRAVENOUS | Status: AC
  Administered 2018-10-21: 21:00:00 1000 mL via INTRAVENOUS

## 2018-10-21 NOTE — MAU Provider Note (Signed)
History     CSN: [id number]  Arrival date and time: 10/21/18 4193   First Provider Initiated Contact with Patient 10/21/18 2007      Chief Complaint  Patient presents with  . Abdominal Pain    left side   HPI Tammy Andrews is a 28 y.o. G3P0110 at [redacted]w[redacted]d who presents with left sided abdominal pain. She states this has been an ongoing problem since December but got worse the last 2 days. She states the pain is 10/10 but has not tried anything for the pain. She states she has not had a bowel movement in 4-5 days but is taking miralax 3 times a day. She also reports uncontrollable nausea and vomiting that has been ongoing this whole pregnancy. She states the last time she took her medication was 4 days ago because she vomited back up. She states her husband saw blood in the toilet but she has not seen any. She has a low lying placenta.  She denies any leaking. Denies any contractions.   She has CHTN and reports a new onset HA today but denies visual changes or epigastric pain. She also has a hx of a 24 week IUFD  OB History    Gravida  3   Para  1   Term  0   Preterm  1   AB  1   Living  0     SAB  0   TAB  1   Ectopic  0   Multiple  0   Live Births  0        Obstetric Comments  G2: per pt, approx 24wks, woke with bleeding (no pain) and dx with IUFD when went to hospital. IOL SVD        Past Medical History:  Diagnosis Date  . Anxiety   . Asthma    childhood  . Depression   . Preterm labor     Past Surgical History:  Procedure Laterality Date  . DILATION AND CURETTAGE OF UTERUS    . LAPAROSCOPIC GASTRIC SLEEVE RESECTION  08/2014    Family History  Problem Relation Age of Onset  . Hypertension Father   . Diabetes Paternal Grandmother     Social History   Tobacco Use  . Smoking status: Former Smoker    Last attempt to quit: 2018    Years since quitting: 2.2  . Smokeless tobacco: Never Used  Substance Use Topics  . Alcohol use: Not Currently     Comment: not in pregnancy  . Drug use: Not Currently    Types: Marijuana    Comment: last used 08-01-18    Allergies:  Allergies  Allergen Reactions  . Pork-Derived Products     Medications Prior to Admission  Medication Sig Dispense Refill Last Dose  . promethazine (PHENERGAN) 25 MG suppository Place 1 suppository (25 mg total) rectally every 6 (six) hours as needed for nausea. 12 suppository 1 Not Taking  . amitriptyline (ELAVIL) 50 MG tablet Take 1 tablet (50 mg total) by mouth daily. 20 tablet 1 10/16/2018 at Unknown time  . Elastic Bandages & Supports (COMFORT FIT MATERNITY SUPP SM) MISC 1 Units by Does not apply route daily as needed. [employer] [phone] [place], [place] [address] 1 each 0   . feeding supplement, ENSURE ENLIVE, (ENSURE ENLIVE) LIQD Take 237 mLs by mouth 4 (four) times daily. 56 Bottle 3 Taking  . pantoprazole (PROTONIX) 40 MG tablet Take 1 tablet (40 mg total)  by mouth daily. 60 tablet 1 10/16/2018 at Unknown time  . Prenatal Vit-Fe Fumarate-FA (PRENATAL MULTIVITAMIN) TABS tablet Take 1 tablet by mouth daily at 12 noon. 30 tablet 1 10/16/2018 at Unknown time  . promethazine (PHENERGAN) 25 MG tablet Take 1 tablet (25 mg total) by mouth every 6 (six) hours for 30 days. 120 tablet 0 Past Week at Unknown time    Review of Systems  Constitutional: Negative.  Negative for fatigue and fever.  HENT: Negative.   Respiratory: Negative.  Negative for shortness of breath.   Cardiovascular: Negative.  Negative for chest pain.  Gastrointestinal: Positive for abdominal pain, constipation, nausea and vomiting. Negative for diarrhea.  Genitourinary: Positive for vaginal bleeding. Negative for dysuria and vaginal discharge.  Neurological: Positive for headaches. Negative for dizziness.   Physical Exam   Blood pressure 131/66, pulse 80, temperature 98.4 F (36.9 C), temperature source Oral, resp. rate 20, height 5' 6.5" (1.689 m), last menstrual period  04/19/2018, SpO2 100 %, unknown if currently breastfeeding.  Patient Vitals for the past 24 hrs:  BP Temp Temp src Pulse Resp SpO2 Height  10/21/18 2315 131/66 - - 80 - - -  10/21/18 2303 (!) 142/76 - - 83 - 100 % -  10/21/18 2100 134/82 - - 88 - - -  10/21/18 2045 129/81 - - 88 - - -  10/21/18 2043 139/89 - - 85 - - -  10/21/18 2021 (!) 147/86 - - 84 - - -  10/21/18 1959 (!) 147/91 - - 87 - - -  10/21/18 1936 135/61 98.4 F (36.9 C) Oral 89 20 100 % 5' 6.5" (1.689 m)   Physical Exam  Nursing note and vitals reviewed. Constitutional: She is oriented to person, place, and time. She appears well-developed and well-nourished. No distress.  HENT:  Head: Normocephalic.  Eyes: Pupils are equal, round, and reactive to light.  Cardiovascular: Normal rate, regular rhythm and normal heart sounds.  Respiratory: Effort normal and breath sounds normal. No respiratory distress.  GI: Soft. Bowel sounds are normal. She exhibits no distension. There is no abdominal tenderness.  Genitourinary:    Genitourinary Comments: SSE: scant amount of creamy white discharge. No blood noted. Cervix appears closed   Neurological: She is alert and oriented to person, place, and time.  Skin: Skin is warm and dry.  Psychiatric: She has a normal mood and affect. Her behavior is normal. Judgment and thought content normal.   Dilation: Closed Effacement (%): Thick Cervical Position: Posterior Exam by:: Ma Hillock CNM  Fetal Tracing:  Baseline: 145 Variability: moderate Accels: none Decels: variable   Toco: ui   MAU Course  Procedures Results for orders placed or performed during the hospital encounter of 10/21/18 (from the past 24 hour(s))  Urinalysis, Routine w reflex microscopic     Status: Abnormal   Collection Time: 10/21/18  8:23 PM  Result Value Ref Range   Color, Urine YELLOW YELLOW   APPearance HAZY (A) CLEAR   Specific Gravity, Urine 1.025 1.005 - 1.030   pH 7.0 5.0 - 8.0   Glucose, UA 100  (A) NEGATIVE mg/dL   Hgb urine dipstick SMALL (A) NEGATIVE   Bilirubin Urine MODERATE (A) NEGATIVE   Ketones, ur 40 (A) NEGATIVE mg/dL   Protein, ur >629 (A) NEGATIVE mg/dL   Nitrite NEGATIVE NEGATIVE   Leukocytes,Ua NEGATIVE NEGATIVE  Urinalysis, Microscopic (reflex)     Status: Abnormal   Collection Time: 10/21/18  8:23 PM  Result Value Ref Range  RBC / HPF 0-5 0 - 5 RBC/hpf   WBC, UA 0-5 0 - 5 WBC/hpf   Bacteria, UA FEW (A) NONE SEEN   Squamous Epithelial / LPF 6-10 0 - 5   Mucus PRESENT    Urine-Other LESS THAN 10 mL OF URINE SUBMITTED   CBC     Status: Abnormal   Collection Time: 10/21/18  8:35 PM  Result Value Ref Range   WBC 11.8 (H) 4.0 - 10.5 K/uL   RBC 2.82 (L) 3.87 - 5.11 MIL/uL   Hemoglobin 9.0 (L) 12.0 - 15.0 g/dL   HCT 16.126.7 (L) 09.636.0 - 04.546.0 %   MCV 94.7 80.0 - 100.0 fL   MCH 31.9 26.0 - 34.0 pg   MCHC 33.7 30.0 - 36.0 g/dL   RDW 40.912.8 81.111.5 - 91.415.5 %   Platelets 214 150 - 400 K/uL   nRBC 0.0 0.0 - 0.2 %  Comprehensive metabolic panel     Status: Abnormal   Collection Time: 10/21/18  8:35 PM  Result Value Ref Range   Sodium 135 135 - 145 mmol/L   Potassium 3.5 3.5 - 5.1 mmol/L   Chloride 107 98 - 111 mmol/L   CO2 20 (L) 22 - 32 mmol/L   Glucose, Bld 81 70 - 99 mg/dL   BUN 10 6 - 20 mg/dL   Creatinine, Ser 7.820.64 0.44 - 1.00 mg/dL   Calcium 8.8 (L) 8.9 - 10.3 mg/dL   Total Protein 6.3 (L) 6.5 - 8.1 g/dL   Albumin 2.9 (L) 3.5 - 5.0 g/dL   AST 14 (L) 15 - 41 U/L   ALT 9 0 - 44 U/L   Alkaline Phosphatase 41 38 - 126 U/L   Total Bilirubin 0.6 0.3 - 1.2 mg/dL   GFR calc non Af Amer >60 >60 mL/min   GFR calc Af Amer >60 >60 mL/min   Anion gap 8 5 - 15  Protein / creatinine ratio, urine     Status: Abnormal   Collection Time: 10/21/18  9:51 PM  Result Value Ref Range   Creatinine, Urine 307.23 mg/dL   Total Protein, Urine 590 mg/dL   Protein Creatinine Ratio 1.92 (H) 0.00 - 0.15 mg/mg[Cre]   [place] Renal  Result Date: 10/21/2018 CLINICAL DATA:  Left flank pain  EXAM: RENAL / URINARY TRACT ULTRASOUND COMPLETE COMPARISON:  None. FINDINGS: Right Kidney: Renal measurements: 12.2 x 6.3 x 6.6 cm = volume: 507 mL . Echogenicity within normal limits. No mass or hydronephrosis visualized. Left Kidney: Renal measurements: 12.1 x 6.5 x 7.0 cm = volume: 550 mL. Echogenicity within normal limits. No mass or hydronephrosis visualized. Bladder: Urinary bladder is poorly distended. IMPRESSION: Normal renal ultrasound. Electronically Signed   By: Deatra RobinsonKevin  Herman M.D.   On: 10/21/2018 22:03   MDM UA- small hgb/with left sided pain, will image to rule out kidney stones US Renal- normal  Patient with chronic hypertension but reporting new onset HA- will get labs CBC, CMP, Protein/creat ratio  LR bolus Phenergan IV Multivitamin bolus  Cervix closed/thick posterior- UI noted on monitoring, resolved with IV hydration  Nausea improved with phenergan- will try flexeril for pain.   Patient states flexeril didn't help. States the only thing that helps this pain is IV dilaudid. Discussed with patient IV pain medication not being the first line with no medical cause of pain. Discussed with patient that pain is likely related to constipation or nerve pain. Recommended patient try enema before pain medication.  Patient reports she was able  to pass some stool after enema but reports the pain is still a 6/10. Lengthy discussion that pain is likely nerve related and may not go away completely. Comfort measures reviewed at length. Will give PO percocet.   Consulted with Dr. Debroah Loop and reviewed lab results- ok to discharge home to follow up as scheduled in the office.  Assessment and Plan   1. Left sided abdominal pain   2. Constipation, unspecified constipation type   3. [redacted] weeks gestation of pregnancy    -Discharge home in stable condition -Encouraged patient to use colace with miralax to help with constipation. Encouraged patient to take nausea medication as  prescribed -Abdominal pain precautions discussed -Patient advised to follow-up with [facility] as scheduled for prenatal care -Patient may return to MAU as needed or if her condition were to change or worsen  Rolm Bookbinder CNM 10/22/2018, 12:15 AM

## 2018-10-21 NOTE — MAU Note (Signed)
Presents by EMS with c/o left side abd pain, radiating to lower back and pelvic area; increasing in intensity approx two days ago. The pain has been going on overall since 06/2018. Pain 9/10. C/o pressure with urination. Denies LOF. Pt states,"husand saw drop of blood in toilet after pt used restroom; however pt did not look to see".  Excessive nausea and vomiting.  Adah Perl RN

## 2018-10-22 DIAGNOSIS — R109 Unspecified abdominal pain: Secondary | ICD-10-CM | POA: Diagnosis not present

## 2018-10-22 DIAGNOSIS — Z3A24 24 weeks gestation of pregnancy: Secondary | ICD-10-CM | POA: Diagnosis not present

## 2018-10-22 DIAGNOSIS — O26892 Other specified pregnancy related conditions, second trimester: Secondary | ICD-10-CM

## 2018-10-22 DIAGNOSIS — K5900 Constipation, unspecified: Secondary | ICD-10-CM

## 2018-10-22 MED ORDER — OXYCODONE-ACETAMINOPHEN 5-325 MG PO TABS
2.0000 | ORAL_TABLET | Freq: Once | ORAL | Status: AC
  Administered 2018-10-22: 2 via ORAL
  Filled 2018-10-22: qty 2

## 2018-10-22 NOTE — Discharge Instructions (Signed)

## 2018-10-23 ENCOUNTER — Telehealth: Payer: Self-pay | Admitting: Medical

## 2018-10-24 NOTE — Telephone Encounter (Signed)
She needs to be seen by someone for an in person OB visit before consideration for writing her out of work can be given.

## 2018-10-24 NOTE — Telephone Encounter (Signed)
Called pt to inform her that, per Dr. Vergie Living, she will need to come in for an OB appointment before she could be written out of work.  Pt verbalized understanding. Will send message to front office staff.

## 2018-10-29 ENCOUNTER — Encounter: Payer: Self-pay | Admitting: Obstetrics and Gynecology

## 2018-10-31 ENCOUNTER — Ambulatory Visit (INDEPENDENT_AMBULATORY_CARE_PROVIDER_SITE_OTHER): Payer: Managed Care, Other (non HMO) | Admitting: Obstetrics & Gynecology

## 2018-10-31 ENCOUNTER — Other Ambulatory Visit: Payer: Self-pay

## 2018-10-31 VITALS — BP 148/97

## 2018-10-31 DIAGNOSIS — Z9884 Bariatric surgery status: Secondary | ICD-10-CM

## 2018-10-31 DIAGNOSIS — O0990 Supervision of high risk pregnancy, unspecified, unspecified trimester: Secondary | ICD-10-CM

## 2018-10-31 DIAGNOSIS — O10912 Unspecified pre-existing hypertension complicating pregnancy, second trimester: Secondary | ICD-10-CM

## 2018-10-31 DIAGNOSIS — O10919 Unspecified pre-existing hypertension complicating pregnancy, unspecified trimester: Secondary | ICD-10-CM

## 2018-10-31 DIAGNOSIS — O0992 Supervision of high risk pregnancy, unspecified, second trimester: Secondary | ICD-10-CM

## 2018-10-31 DIAGNOSIS — Z3A25 25 weeks gestation of pregnancy: Secondary | ICD-10-CM

## 2018-10-31 MED ORDER — ASPIRIN 81 MG PO TABS: 81.0000 mg | ORAL_TABLET | Freq: Every day | ORAL | 12 refills | Status: DC

## 2018-10-31 NOTE — Progress Notes (Unsigned)
Received e-mail from [employer] stating that they received a critically high BP reading for pt of 152/100.

## 2018-10-31 NOTE — Progress Notes (Signed)
   TELEHEALTH VIRTUAL OBSTETRICS VISIT ENCOUNTER NOTE  I connected with Tammy Andrews on 10/31/18 at  2:15 PM EDT by telephone at home and verified that I am speaking with the correct person using two identifiers.   I discussed the limitations, risks, security and privacy concerns of performing an evaluation and management service by telephone and the availability of in person appointments. I also discussed with the patient that there may be a patient responsible charge related to this service. The patient expressed understanding and agreed to proceed.  Subjective:  Tammy Andrews is a 28 y.o. G3P0110 at [redacted]w[redacted]d being followed for ongoing prenatal care.  She is currently monitored for the following issues for this high-risk pregnancy and has Chronic hypertension during pregnancy, antepartum; Hyperemesis gravidarum before end of [redacted] week gestation with dehydration; History of fetal demise, not currently pregnant; Subchorionic hemorrhage in second trimester; Supervision of high risk pregnancy, antepartum; Constipation during pregnancy; Back pain affecting pregnancy; Marijuana abuse; H/O bariatric surgery; Anemia in pregnancy; Vaginal bleeding during pregnancy; Elevated amylase; Fetal demise in singleton pregnancy greater than [redacted] weeks gestation, antepartum; and Low-lying placenta on their problem list.  Patient reports that she has not been at her job at [employer] since January. She says that she can't go due to vomitting.. Reports fetal movement. Denies any contractions, bleeding or leaking of fluid.   The following portions of the patient's history were reviewed and updated as appropriate: allergies, current medications, past family history, past medical history, past social history, past surgical history and problem list.   Objective:   General:  Alert, oriented and cooperative.   Mental Status: Normal mood and affect perceived. Normal judgment and thought content.  Rest of physical exam  deferred due to type of encounter  Assessment and Plan:  Pregnancy: G3P0110 at [redacted]w[redacted]d 1. Chronic hypertension during pregnancy, antepartum - baby asa prescribed as she has not been taking it - She has had headaches for several weeks, no worse recently - Pre eclampsia precautions  2. Supervision of high risk pregnancy, antepartum - repeat MFM on 11/08/18 - 2 hour GTT, labs, tdap in 9 days  3. H/O bariatric surgery  4. Anemia in pregnancy - fereheme x 2  5. Desire to be written out of work- I told her that she will need to be weighed here at our office and that I will base my decision on that.  Preterm labor symptoms and general obstetric precautions including but not limited to vaginal bleeding, contractions, leaking of fluid and fetal movement were reviewed in detail with the patient.  I discussed the assessment and treatment plan with the patient. The patient was provided an opportunity to ask questions and all were answered. The patient agreed with the plan and demonstrated an understanding of the instructions. The patient was advised to call back or seek an in-person office evaluation/go to MAU at [facility] for any urgent or concerning symptoms. Please refer to After Visit Summary for other counseling recommendations.   I provided 8 minutes of non-face-to-face time during this encounter.  No follow-ups on file.  Future Appointments  Date Time Provider Department Center  11/08/2018  8:30 AM [facility]-M[facility] NURSE WH-MFC MFC-US  11/08/2018  8:30 AM WH-MFC [place] 1 WH-MFCUS MFC-US  11/09/2018  8:20 AM WOC-WOCA LAB WOC-WOCA WOC  11/09/2018  9:15 AM [place] Bing, MD WOC-WOCA WOC    Allie Bossier, MD [facility], [facility]

## 2018-11-02 ENCOUNTER — Encounter: Payer: Self-pay | Admitting: Emergency Medicine

## 2018-11-02 ENCOUNTER — Encounter: Payer: Self-pay | Admitting: Advanced Practice Midwife

## 2018-11-02 ENCOUNTER — Telehealth: Payer: Self-pay | Admitting: Emergency Medicine

## 2018-11-02 ENCOUNTER — Other Ambulatory Visit: Payer: Self-pay | Admitting: Emergency Medicine

## 2018-11-02 NOTE — Telephone Encounter (Signed)
Called pt and notified her of appointment on 4/21 @ 9:00 am for [facility] infusion. Pt was given the time and date and instructions on where to go. Pt was told to call the clinic with any questions or concerns.

## 2018-11-05 NOTE — Discharge Instructions (Signed)

## 2018-11-06 ENCOUNTER — Other Ambulatory Visit: Payer: Self-pay

## 2018-11-06 ENCOUNTER — Ambulatory Visit (HOSPITAL_COMMUNITY)
Admission: RE | Admit: 2018-11-06 | Discharge: 2018-11-06 | Disposition: A | Discharge status: Home / Self Care | Payer: Managed Care, Other (non HMO) | Source: Ambulatory Visit | Attending: Obstetrics & Gynecology | Admitting: Obstetrics & Gynecology

## 2018-11-06 DIAGNOSIS — D649 Anemia, unspecified: Secondary | ICD-10-CM | POA: Insufficient documentation

## 2018-11-06 MED ORDER — SODIUM CHLORIDE 0.9 % IV SOLN
510.0000 mg | INTRAVENOUS | Status: DC
  Administered 2018-11-06: 510 mg via INTRAVENOUS
  Filled 2018-11-06: qty 510

## 2018-11-08 ENCOUNTER — Ambulatory Visit (HOSPITAL_COMMUNITY): Payer: Managed Care, Other (non HMO) | Admitting: *Deleted

## 2018-11-08 ENCOUNTER — Ambulatory Visit (HOSPITAL_COMMUNITY)
Admission: RE | Admit: 2018-11-08 | Discharge: 2018-11-08 | Disposition: A | Discharge status: Home / Self Care | Payer: Managed Care, Other (non HMO) | Source: Ambulatory Visit | Attending: Obstetrics and Gynecology | Admitting: Obstetrics and Gynecology

## 2018-11-08 ENCOUNTER — Encounter (HOSPITAL_COMMUNITY): Payer: Self-pay

## 2018-11-08 ENCOUNTER — Other Ambulatory Visit: Payer: Self-pay

## 2018-11-08 VITALS — BP 107/64 | HR 83 | Temp 98.4°F

## 2018-11-08 DIAGNOSIS — O10919 Unspecified pre-existing hypertension complicating pregnancy, unspecified trimester: Secondary | ICD-10-CM

## 2018-11-08 DIAGNOSIS — Z362 Encounter for other antenatal screening follow-up: Secondary | ICD-10-CM | POA: Insufficient documentation

## 2018-11-08 DIAGNOSIS — Z3A27 27 weeks gestation of pregnancy: Secondary | ICD-10-CM

## 2018-11-08 DIAGNOSIS — O10012 Pre-existing essential hypertension complicating pregnancy, second trimester: Secondary | ICD-10-CM | POA: Diagnosis not present

## 2018-11-08 DIAGNOSIS — O4592 Premature separation of placenta, unspecified, second trimester: Secondary | ICD-10-CM

## 2018-11-08 DIAGNOSIS — O99322 Drug use complicating pregnancy, second trimester: Secondary | ICD-10-CM

## 2018-11-08 DIAGNOSIS — O09292 Supervision of pregnancy with other poor reproductive or obstetric history, second trimester: Secondary | ICD-10-CM

## 2018-11-08 DIAGNOSIS — O99212 Obesity complicating pregnancy, second trimester: Secondary | ICD-10-CM

## 2018-11-09 ENCOUNTER — Other Ambulatory Visit: Payer: Managed Care, Other (non HMO)

## 2018-11-09 ENCOUNTER — Ambulatory Visit (INDEPENDENT_AMBULATORY_CARE_PROVIDER_SITE_OTHER): Payer: Managed Care, Other (non HMO) | Admitting: Obstetrics & Gynecology

## 2018-11-09 ENCOUNTER — Other Ambulatory Visit: Payer: Self-pay | Admitting: *Deleted

## 2018-11-09 ENCOUNTER — Encounter: Payer: Self-pay | Admitting: Obstetrics and Gynecology

## 2018-11-09 VITALS — BP 123/77 | HR 80 | Temp 98.0°F | Wt 230.0 lb

## 2018-11-09 DIAGNOSIS — Z23 Encounter for immunization: Secondary | ICD-10-CM

## 2018-11-09 DIAGNOSIS — O0990 Supervision of high risk pregnancy, unspecified, unspecified trimester: Secondary | ICD-10-CM

## 2018-11-09 DIAGNOSIS — O364XX0 Maternal care for intrauterine death, not applicable or unspecified: Secondary | ICD-10-CM

## 2018-11-09 DIAGNOSIS — Z3A27 27 weeks gestation of pregnancy: Secondary | ICD-10-CM

## 2018-11-09 DIAGNOSIS — Z8759 Personal history of other complications of pregnancy, childbirth and the puerperium: Secondary | ICD-10-CM

## 2018-11-09 DIAGNOSIS — O10919 Unspecified pre-existing hypertension complicating pregnancy, unspecified trimester: Secondary | ICD-10-CM

## 2018-11-09 DIAGNOSIS — Z9884 Bariatric surgery status: Secondary | ICD-10-CM

## 2018-11-09 DIAGNOSIS — O10912 Unspecified pre-existing hypertension complicating pregnancy, second trimester: Secondary | ICD-10-CM

## 2018-11-09 NOTE — Progress Notes (Signed)
   PRENATAL VISIT NOTE  Subjective:  Tammy Andrews is a 28 y.o. G3P0110 at [redacted]w[redacted]d being seen today for ongoing prenatal care.  She is currently monitored for the following issues for this high risk pregnancy and has Chronic hypertension during pregnancy, antepartum; Hyperemesis gravidarum before end of [redacted] week gestation with dehydration; Subchorionic hemorrhage in second trimester; Supervision of high risk pregnancy, antepartum; Constipation during pregnancy; Back pain affecting pregnancy; Marijuana abuse; H/O bariatric surgery; Anemia in pregnancy; Vaginal bleeding during pregnancy; Elevated amylase; and Fetal demise in singleton pregnancy greater than [redacted] weeks gestation, antepartum on their problem list.  Patient reports constipation resolved with Miralax TID. She is trying work at [employer] but vomits daily so she has leave work. Requesting FMLA. Contractions: Not present. Vag. Bleeding: None.  Movement: Present. Denies leaking of fluid.   The following portions of the patient's history were reviewed and updated as appropriate: allergies, current medications, past family history, past medical history, past social history, past surgical history and problem list.   Objective:   Vitals:   11/09/18 0935  BP: 123/77  Pulse: 80  Temp: 98 F (36.7 C)  Weight: 230 lb (104.3 kg)    Fetal Status: Fetal Heart Rate (bpm): 154   Movement: Present     General:  Alert, oriented and cooperative. Patient is in no acute distress.  Skin: Skin is warm and dry. No rash noted.   Cardiovascular: Normal heart rate noted  Respiratory: Normal respiratory effort, no problems with respiration noted  Abdomen: Soft, gravid, appropriate for gestational age.  Pain/Pressure: Absent     Pelvic: deferred  Extremities: Normal range of motion.  Edema: None  Mental Status: Normal mood and affect. Normal behavior. Normal judgment and thought content.   Assessment and Plan:  Pregnancy: G3P0110 at [redacted]w[redacted]d 1.  Supervision of high risk pregnancy, antepartum - routine labs. She threw up her glucola and can't do that test so I will check a fasting sugar today as well as a HBA1C again - Tdap vaccine greater than or equal to 7yo IM  2. Chronic hypertension during pregnancy, antepartum - no meds and good BPs, on baby asa - MFM u/s yesterday and every 4 weeks   4. H/O bariatric surgery   5. Fetal demise in singleton pregnancy greater than [redacted] weeks gestation, antepartum  Anemia in pregnancy- she has #2 Fereheme infusion next week  Vomitting at work- rec FMLA  Preterm labor symptoms and general obstetric precautions including but not limited to vaginal bleeding, contractions, leaking of fluid and fetal movement were reviewed in detail with the patient. Please refer to After Visit Summary for other counseling recommendations.   No follow-ups on file.  Future Appointments  Date Time Provider Department Center  11/13/2018  9:00 AM [facility] ROOM 7 [facility] None    Allie Bossier, MD

## 2018-11-12 ENCOUNTER — Telehealth: Payer: Self-pay | Admitting: *Deleted

## 2018-11-12 LAB — CBC
Hematocrit: 29 % — ABNORMAL LOW (ref 34.0–46.6)
Hemoglobin: 9.5 g/dL — ABNORMAL LOW (ref 11.1–15.9)
MCH: 33.3 pg — ABNORMAL HIGH (ref 26.6–33.0)
MCHC: 32.8 g/dL (ref 31.5–35.7)
MCV: 102 fL — ABNORMAL HIGH (ref 79–97)
Platelets: 266 10*3/uL (ref 150–450)
RBC: 2.85 x10E6/uL — ABNORMAL LOW (ref 3.77–5.28)
RDW: 12.8 % (ref 11.7–15.4)
WBC: 10.1 10*3/uL (ref 3.4–10.8)

## 2018-11-12 LAB — HIV ANTIBODY (ROUTINE TESTING W REFLEX): HIV Screen 4th Generation wRfx: NONREACTIVE

## 2018-11-12 LAB — RPR: RPR Ser Ql: NONREACTIVE

## 2018-11-12 LAB — GLUCOSE TOLERANCE, 2 HOURS W/ 1HR: Glucose, Fasting: 67 mg/dL (ref 65–91)

## 2018-11-12 NOTE — Telephone Encounter (Signed)
Received a call to verify dates of last appt and next appt and diagonsis

## 2018-11-13 ENCOUNTER — Other Ambulatory Visit: Payer: Self-pay

## 2018-11-13 ENCOUNTER — Ambulatory Visit (HOSPITAL_COMMUNITY)
Admission: RE | Admit: 2018-11-13 | Discharge: 2018-11-13 | Disposition: A | Discharge status: Home / Self Care | Payer: Managed Care, Other (non HMO) | Source: Ambulatory Visit | Attending: Obstetrics & Gynecology | Admitting: Obstetrics & Gynecology

## 2018-11-13 DIAGNOSIS — D649 Anemia, unspecified: Secondary | ICD-10-CM | POA: Diagnosis not present

## 2018-11-13 MED ORDER — SODIUM CHLORIDE 0.9 % IV SOLN
510.0000 mg | INTRAVENOUS | Status: DC
  Administered 2018-11-13: 510 mg via INTRAVENOUS
  Filled 2018-11-13: qty 510

## 2018-11-13 NOTE — Progress Notes (Signed)
Pt received second dose of IV feraheme.  She had finished the infusion and began vomitting a few minutes later.  She stated that she vomitted at home after she got the first dose of iron.  I placed feraheme as an allergy in her chart and called Dr. Norman Clay office and reported the reaction.  Pt denies chest pain, SOB, dizziness.  She states she only has  Nausea and vomitting.  At this time she stated she does not need anything for the N/V and feels like it is getting better.  Will continue to monitor patient and DC home once she is feeling better.

## 2018-11-14 ENCOUNTER — Telehealth (INDEPENDENT_AMBULATORY_CARE_PROVIDER_SITE_OTHER): Payer: Managed Care, Other (non HMO) | Admitting: *Deleted

## 2018-11-14 DIAGNOSIS — Z029 Encounter for administrative examinations, unspecified: Secondary | ICD-10-CM

## 2018-11-14 NOTE — Telephone Encounter (Signed)
Called pt regarding [employer] and pt not logging BP since 10/31/18.  Pt stated she would log her blood pressure as soon as she got home and denied any difficulty in taking a BP, accessing the app, or entering BP.  Advised pt that we request that she take and log her BP once a week.  Recommended pt set a reminder on her phone.  Pt verbalized understanding.

## 2018-11-18 ENCOUNTER — Inpatient Hospital Stay (HOSPITAL_COMMUNITY): Payer: Managed Care, Other (non HMO)

## 2018-11-18 ENCOUNTER — Other Ambulatory Visit: Payer: Self-pay

## 2018-11-18 ENCOUNTER — Inpatient Hospital Stay (HOSPITAL_COMMUNITY)
Admission: AD | Admit: 2018-11-18 | Discharge: 2018-11-18 | Disposition: A | Discharge status: Home / Self Care | Payer: Managed Care, Other (non HMO) | Attending: Obstetrics and Gynecology | Admitting: Obstetrics and Gynecology

## 2018-11-18 ENCOUNTER — Encounter (HOSPITAL_COMMUNITY): Payer: Self-pay

## 2018-11-18 DIAGNOSIS — Z87891 Personal history of nicotine dependence: Secondary | ICD-10-CM | POA: Diagnosis not present

## 2018-11-18 DIAGNOSIS — O212 Late vomiting of pregnancy: Secondary | ICD-10-CM | POA: Diagnosis not present

## 2018-11-18 DIAGNOSIS — M549 Dorsalgia, unspecified: Secondary | ICD-10-CM | POA: Diagnosis not present

## 2018-11-18 DIAGNOSIS — O99512 Diseases of the respiratory system complicating pregnancy, second trimester: Secondary | ICD-10-CM | POA: Diagnosis not present

## 2018-11-18 DIAGNOSIS — J45909 Unspecified asthma, uncomplicated: Secondary | ICD-10-CM | POA: Insufficient documentation

## 2018-11-18 DIAGNOSIS — Z3A28 28 weeks gestation of pregnancy: Secondary | ICD-10-CM | POA: Diagnosis not present

## 2018-11-18 DIAGNOSIS — O26892 Other specified pregnancy related conditions, second trimester: Secondary | ICD-10-CM | POA: Insufficient documentation

## 2018-11-18 DIAGNOSIS — Z79899 Other long term (current) drug therapy: Secondary | ICD-10-CM | POA: Diagnosis not present

## 2018-11-18 DIAGNOSIS — O162 Unspecified maternal hypertension, second trimester: Secondary | ICD-10-CM | POA: Diagnosis not present

## 2018-11-18 DIAGNOSIS — Z7982 Long term (current) use of aspirin: Secondary | ICD-10-CM | POA: Diagnosis not present

## 2018-11-18 DIAGNOSIS — Z3689 Encounter for other specified antenatal screening: Secondary | ICD-10-CM | POA: Diagnosis not present

## 2018-11-18 DIAGNOSIS — O9989 Other specified diseases and conditions complicating pregnancy, childbirth and the puerperium: Secondary | ICD-10-CM | POA: Diagnosis not present

## 2018-11-18 DIAGNOSIS — R109 Unspecified abdominal pain: Secondary | ICD-10-CM | POA: Diagnosis not present

## 2018-11-18 DIAGNOSIS — O219 Vomiting of pregnancy, unspecified: Secondary | ICD-10-CM

## 2018-11-18 LAB — CBC WITH DIFFERENTIAL/PLATELET
Abs Immature Granulocytes: 0.06 10*3/uL (ref 0.00–0.07)
Basophils Absolute: 0 10*3/uL (ref 0.0–0.1)
Basophils Relative: 0 %
Eosinophils Absolute: 0.1 10*3/uL (ref 0.0–0.5)
Eosinophils Relative: 1 %
HCT: 28.2 % — ABNORMAL LOW (ref 36.0–46.0)
Hemoglobin: 9.5 g/dL — ABNORMAL LOW (ref 12.0–15.0)
Immature Granulocytes: 1 %
Lymphocytes Relative: 19 %
Lymphs Abs: 2.4 10*3/uL (ref 0.7–4.0)
MCH: 32.4 pg (ref 26.0–34.0)
MCHC: 33.7 g/dL (ref 30.0–36.0)
MCV: 96.2 fL (ref 80.0–100.0)
Monocytes Absolute: 0.9 10*3/uL (ref 0.1–1.0)
Monocytes Relative: 7 %
Neutro Abs: 9.5 10*3/uL — ABNORMAL HIGH (ref 1.7–7.7)
Neutrophils Relative %: 72 %
Platelets: 255 10*3/uL (ref 150–400)
RBC: 2.93 MIL/uL — ABNORMAL LOW (ref 3.87–5.11)
RDW: 12.4 % (ref 11.5–15.5)
WBC: 12.9 10*3/uL — ABNORMAL HIGH (ref 4.0–10.5)
nRBC: 0 % (ref 0.0–0.2)

## 2018-11-18 LAB — AMYLASE: Amylase: 161 U/L — ABNORMAL HIGH (ref 28–100)

## 2018-11-18 LAB — COMPREHENSIVE METABOLIC PANEL
ALT: 19 U/L (ref 0–44)
AST: 27 U/L (ref 15–41)
Albumin: 3 g/dL — ABNORMAL LOW (ref 3.5–5.0)
Alkaline Phosphatase: 55 U/L (ref 38–126)
Anion gap: 8 (ref 5–15)
BUN: 10 mg/dL (ref 6–20)
CO2: 22 mmol/L (ref 22–32)
Calcium: 9.4 mg/dL (ref 8.9–10.3)
Chloride: 105 mmol/L (ref 98–111)
Creatinine, Ser: 0.66 mg/dL (ref 0.44–1.00)
GFR calc Af Amer: >60 mL/min (ref >60)
GFR calc non Af Amer: >60 mL/min (ref >60)
Glucose, Bld: 72 mg/dL (ref 70–99)
Potassium: 4.5 mmol/L (ref 3.5–5.1)
Sodium: 135 mmol/L (ref 135–145)
Total Bilirubin: 0.8 mg/dL (ref 0.3–1.2)
Total Protein: 6.1 g/dL — ABNORMAL LOW (ref 6.5–8.1)

## 2018-11-18 LAB — URINALYSIS, ROUTINE W REFLEX MICROSCOPIC
Bacteria, UA: NONE SEEN
Bilirubin Urine: NEGATIVE
Glucose, UA: NEGATIVE mg/dL
Ketones, ur: NEGATIVE mg/dL
Leukocytes,Ua: NEGATIVE
Nitrite: NEGATIVE
Protein, ur: 100 mg/dL — AB
Specific Gravity, Urine: 1.02 (ref 1.005–1.030)
pH: 7 (ref 5.0–8.0)

## 2018-11-18 MED ORDER — LACTATED RINGERS IV SOLN
INTRAVENOUS | Status: DC
  Administered 2018-11-18: 14:00:00 via INTRAVENOUS

## 2018-11-18 MED ORDER — SODIUM CHLORIDE 0.9 % IV SOLN
8.0000 mg | Freq: Once | INTRAVENOUS | Status: AC
  Administered 2018-11-18: 8 mg via INTRAVENOUS
  Filled 2018-11-18: qty 4

## 2018-11-18 MED ORDER — HYDROMORPHONE HCL 1 MG/ML IJ SOLN
1.0000 mg | Freq: Once | INTRAMUSCULAR | Status: AC
  Administered 2018-11-18: 1 mg via INTRAVENOUS
  Filled 2018-11-18: qty 1

## 2018-11-18 MED ORDER — OXYCODONE-ACETAMINOPHEN 5-325 MG PO TABS: 1.0000 | ORAL_TABLET | Freq: Four times a day (QID) | ORAL | 0 refills | Status: DC | PRN

## 2018-11-18 MED ORDER — FAMOTIDINE IN NACL 20-0.9 MG/50ML-% IV SOLN
20.0000 mg | Freq: Once | INTRAVENOUS | Status: AC
  Administered 2018-11-18: 20 mg via INTRAVENOUS
  Filled 2018-11-18: qty 50

## 2018-11-18 NOTE — Progress Notes (Signed)
Pt states she is feeling some better, her pain is down to a 3/10 and she said the zofran has helped her nausea.

## 2018-11-18 NOTE — MAU Note (Signed)
Pt reports lower left sided back pain that radiates around to her lower left abd for past 2 months that has gotten worse today.  Pt reports taking tylenol yesterday around 4pm.  Pt reports vomiting X 6 today. Pt denies vag bleeding or LOF

## 2018-11-18 NOTE — MAU Provider Note (Signed)
History     CSN: [id number]  Arrival date and time: 11/18/18 1215   First Provider Initiated Contact with Patient 11/18/18 1303      Chief Complaint  Patient presents with  . Abdominal Pain  . Back Pain  . Emesis   [name] is a 28 y.o. G3P0110 at [redacted]w[redacted]d who presents to MAU for left-sided pain. Pt states she got up quickly this morning to vomit and the pain in her left side was so bad that she stumbled on her way to the bathroom from pain. 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 Pt's weight today is 104.3kg, was 106.6kg on 11/06/2018 and 104.3 on 10/16/2018.  Onset: 1-33mo ago, worsened this morning to the point that pt could not walk d/t pain Location: left flank, radiating around to side of abdomen Duration: 1-49mo Character: intermittent for past 1-37mo, but constant now, sharp and stabbing, "feels like something is being twisted"; on side of left abdomen feels like a muscle soreness on palpation otherwise abdomen does not bother her Aggravating/Associated: none/N&V, but has been struggling with this her entire pregnancy Relieving: none Treatment: Tylenol can make the pain more mild, but cannot take it away, last took Tylenol yesterday at 1600 Severity: 7-8/10, sometimes a 10  Pt denies VB, LOF, ctx, decreased FM, vaginal discharge/odor/itching. Pt denies constipation, diarrhea, or urinary problems. Pt denies fever, chills, fatigue, sweating or changes in appetite. Pt denies SOB or chest pain. Pt denies dizziness, HA, light-headedness, weakness.  Problems this pregnancy include: anemia, HEG, cHTN. Allergies? Feraheme, pork Current medications/supplements? baby ASA, PNVs, Protonix, amytryptalyine, phenergan - has not taken any of these medications in the past week d/t nausea and vomiting Prenatal care provider? WOC, next appt 12/09/2018   OB History    Gravida  3   Para  1   Term  0    Preterm  1   AB  1   Living  0     SAB  0   TAB  1   Ectopic  0   Multiple  0   Live Births  0        Obstetric Comments  G2: per pt, approx 24wks, woke with bleeding (no pain) and dx with IUFD when went to hospital. IOL SVD        Past Medical History:  Diagnosis Date  . Anemia   . Anxiety   . Asthma    childhood  . Depression   . Hypertension   . Preterm labor     Past Surgical History:  Procedure Laterality Date  . DILATION AND CURETTAGE OF UTERUS    . LAPAROSCOPIC GASTRIC SLEEVE RESECTION  08/2014    Family History  Problem Relation Age of Onset  . Hypertension Father   . Diabetes Paternal Grandmother     Social History   Tobacco Use  . Smoking status: Former Smoker    Last attempt to quit: 2018    Years since quitting: 2.3  . Smokeless tobacco: Never Used  Substance Use Topics  . Alcohol use: Not Currently    Comment: not in pregnancy  . Drug use: Not Currently    Types: Marijuana    Comment: last used 11/15/2018    Allergies:  Allergies  Allergen Reactions  . Feraheme [Ferumoxytol] Nausea And Vomiting  . Pork-Derived Products     Medications Prior to Admission  Medication Sig Dispense Refill  Last Dose  . amitriptyline (ELAVIL) 50 MG tablet Take 1 tablet (50 mg total) by mouth daily. 20 tablet 1 Past Week at Unknown time  . aspirin 81 MG tablet Take 1 tablet (81 mg total) by mouth daily. 30 tablet 12 Past Week at Unknown time  . Elastic Bandages & Supports (COMFORT FIT MATERNITY SUPP SM) MISC 1 Units by Does not apply route daily as needed. [employer] [phone] [address]. [address] [address] [place] [phone] 1 each 0 Past Week at Unknown time  . pantoprazole (PROTONIX) 40 MG tablet Take 1 tablet (40 mg total) by mouth daily. 60 tablet 1 Past Week at Unknown time  . Prenatal Vit-Fe Fumarate-FA (PRENATAL MULTIVITAMIN) TABS tablet Take 1 tablet by mouth daily at 12 noon. 30 tablet 1 Past Week at Unknown time  . promethazine  (PHENERGAN) 25 MG suppository Place 1 suppository (25 mg total) rectally every 6 (six) hours as needed for nausea. 12 suppository 1 Past Week at Unknown time  . feeding supplement, ENSURE ENLIVE, (ENSURE ENLIVE) LIQD Take 237 mLs by mouth 4 (four) times daily. 56 Bottle 3 Taking  . promethazine (PHENERGAN) 25 MG tablet Take 1 tablet (25 mg total) by mouth every 6 (six) hours for 30 days. 120 tablet 0 Taking    Review of Systems  Constitutional: Negative for appetite change, chills, diaphoresis, fatigue and fever.  Respiratory: Negative for shortness of breath.   Cardiovascular: Negative for chest pain.  Gastrointestinal: Positive for abdominal pain, nausea and vomiting. Negative for constipation and diarrhea.  Genitourinary: Positive for flank pain. Negative for difficulty urinating, dysuria, frequency, hematuria, pelvic pain, urgency, vaginal bleeding and vaginal discharge.  Musculoskeletal: Positive for back pain.  Neurological: Negative for dizziness, weakness, light-headedness and headaches.   Physical Exam   Blood pressure 135/85, pulse 81, temperature 98.8 F (37.1 C), temperature source Oral, resp. rate 20, last menstrual period 04/19/2018, SpO2 100 %, unknown if currently breastfeeding.  Patient Vitals for the past 24 hrs:  BP Temp Temp src Pulse Resp SpO2  11/18/18 1601 135/85 - - 81 - -  11/18/18 1256 129/70 - - 79 - -  11/18/18 1241 123/72 98.8 F (37.1 C) Oral 83 20 100 %    Physical Exam  Constitutional: She is oriented to person, place, and time. She appears well-developed and well-nourished. No distress.  HENT:  Head: Normocephalic and atraumatic.  Respiratory: Effort normal.  GI: Soft. She exhibits no distension and no mass. There is abdominal tenderness in the left upper quadrant. There is CVA tenderness (left-side only). There is no rebound and no guarding.  Neurological: She is alert and oriented to person, place, and time.  Skin: Skin is warm and dry. She is not  diaphoretic.  Psychiatric: She has a normal mood and affect. Her behavior is normal. Judgment and thought content normal.   Results for orders placed or performed during the hospital encounter of 11/18/18 (from the past 24 hour(s))  Urinalysis, Routine w reflex microscopic     Status: Abnormal   Collection Time: 11/18/18 12:22 PM  Result Value Ref Range   Color, Urine YELLOW YELLOW   APPearance HAZY (A) CLEAR   Specific Gravity, Urine 1.020 1.005 - 1.030   pH 7.0 5.0 - 8.0   Glucose, UA NEGATIVE NEGATIVE mg/dL   Hgb urine dipstick SMALL (A) NEGATIVE   Bilirubin Urine NEGATIVE NEGATIVE   Ketones, ur NEGATIVE NEGATIVE mg/dL   Protein, ur 409100 (A) NEGATIVE mg/dL   Nitrite NEGATIVE NEGATIVE  Leukocytes,Ua NEGATIVE NEGATIVE   RBC / HPF 6-10 0 - 5 RBC/hpf   WBC, UA 0-5 0 - 5 WBC/hpf   Bacteria, UA NONE SEEN NONE SEEN   Squamous Epithelial / LPF 11-20 0 - 5   Mucus PRESENT   Comprehensive metabolic panel     Status: Abnormal   Collection Time: 11/18/18  1:54 PM  Result Value Ref Range   Sodium 135 135 - 145 mmol/L   Potassium 4.5 3.5 - 5.1 mmol/L   Chloride 105 98 - 111 mmol/L   CO2 22 22 - 32 mmol/L   Glucose, Bld 72 70 - 99 mg/dL   BUN 10 6 - 20 mg/dL   Creatinine, Ser 4.09 0.44 - 1.00 mg/dL   Calcium 9.4 8.9 - 81.1 mg/dL   Total Protein 6.1 (L) 6.5 - 8.1 g/dL   Albumin 3.0 (L) 3.5 - 5.0 g/dL   AST 27 15 - 41 U/L   ALT 19 0 - 44 U/L   Alkaline Phosphatase 55 38 - 126 U/L   Total Bilirubin 0.8 0.3 - 1.2 mg/dL   GFR calc non Af Amer >60 >60 mL/min   GFR calc Af Amer >60 >60 mL/min   Anion gap 8 5 - 15  CBC with Differential/Platelet     Status: Abnormal   Collection Time: 11/18/18  1:54 PM  Result Value Ref Range   WBC 12.9 (H) 4.0 - 10.5 K/uL   RBC 2.93 (L) 3.87 - 5.11 MIL/uL   Hemoglobin 9.5 (L) 12.0 - 15.0 g/dL   HCT 91.4 (L) 78.2 - 95.6 %   MCV 96.2 80.0 - 100.0 fL   MCH 32.4 26.0 - 34.0 pg   MCHC 33.7 30.0 - 36.0 g/dL   RDW 21.3 08.6 - 57.8 %   Platelets 255 150 -  400 K/uL   nRBC 0.0 0.0 - 0.2 %   Neutrophils Relative % 72 %   Neutro Abs 9.5 (H) 1.7 - 7.7 K/uL   Lymphocytes Relative 19 %   Lymphs Abs 2.4 0.7 - 4.0 K/uL   Monocytes Relative 7 %   Monocytes Absolute 0.9 0.1 - 1.0 K/uL   Eosinophils Relative 1 %   Eosinophils Absolute 0.1 0.0 - 0.5 K/uL   Basophils Relative 0 %   Basophils Absolute 0.0 0.0 - 0.1 K/uL   Immature Granulocytes 1 %   Abs Immature Granulocytes 0.06 0.00 - 0.07 K/uL   US Renal  Result Date: 11/18/2018 CLINICAL DATA:  Costovertebral pain. EXAM: RENAL / URINARY TRACT ULTRASOUND COMPLETE COMPARISON:  None. FINDINGS: Right Kidney: Renal measurements: 11.6 x 6.4 x 6 cm = volume: 237 mL . Echogenicity within normal limits. No mass or hydronephrosis visualized. Left Kidney: Renal measurements: 11.3 x 5.3 x 6.4 cm = volume: 200 mL. Echogenicity within normal limits. No mass or hydronephrosis visualized. Bladder: Appears normal for degree of bladder distention. IMPRESSION: Normal renal ultrasound. Electronically Signed   By: Elige Ko   On: 11/18/2018 15:14   US Renal  Result Date: 10/21/2018 CLINICAL DATA:  Left flank pain EXAM: RENAL / URINARY TRACT ULTRASOUND COMPLETE COMPARISON:  None. FINDINGS: Right Kidney: Renal measurements: 12.2 x 6.3 x 6.6 cm = volume: 507 mL . Echogenicity within normal limits. No mass or hydronephrosis visualized. Left Kidney: Renal measurements: 12.1 x 6.5 x 7.0 cm = volume: 550 mL. Echogenicity within normal limits. No mass or hydronephrosis visualized. Bladder: Urinary bladder is poorly distended. IMPRESSION: Normal renal ultrasound. Electronically Signed   By: Caryn Bee  Chase Picket M.D.   On: 10/21/2018 22:03   [place] [facility] Ob Follow Up  Result Date: 11/08/2018 ----------------------------------------------------------------------  OBSTETRICS REPORT                       (Signed Final 11/08/2018 10:13 am) ---------------------------------------------------------------------- Patient Info  ID #:       [id number]                           D.O.B.:  [birth year] (27 yrs)  Name:       [name]                 Visit Date: 11/08/2018 09:05 am ---------------------------------------------------------------------- Performed By  Performed By:     Percell Boston          Ref. Address:     [address]                                                             [place], [place]                                                             [address]  Attending:        Noralee Space MD        Location[facility]  Referred By:      Adam Phenix                    MD ---------------------------------------------------------------------- Orders   #  Description                          Code         Ordered By   1  [place] [facility] OB FOLLOW UP                  19147.82     Noralee Space  ----------------------------------------------------------------------   #  Order #                    Accession #  Episode #   1  [id number]                  [id number]                  [id number]  ---------------------------------------------------------------------- Indications   [redacted] weeks gestation of pregnancy                Z3A.27   Hypertension - Chronic/Pre-existing            O10.019   Poor obstetric history: Previous IUFD          O09.299   (stillbirth) 24 weeks   Drug use complicating pregnancy, 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)   Subchorionic hemorrhage, antepartum at 12      O45.90   weeks   Obesity complicating pregnancy, second         O99.212   trimester (prepregnancy BMI 42.15)   Encounter for other antenatal screening        Z36.2   follow-up (neg QUAD)  ---------------------------------------------------------------------- Vital Signs                                                 Height:        5'7"  ---------------------------------------------------------------------- Fetal Evaluation  Num Of Fetuses:         1  Fetal Heart Rate(bpm):  151  Cardiac Activity:       Observed  Presentation:           Breech  Placenta:               Anterior  P. Cord Insertion:      Previously Visualized  Amniotic Fluid  AFI FV:      Within normal limits                              Largest Pocket(cm)                              6.11 ---------------------------------------------------------------------- Biometry  BPD:      61.1  mm     G. Age:  24w 6d          1  %    CI:        71.44   %    70 - 86                                                          FL/HC:      20.9   %    18.6 - 20.4  HC:      230.2  mm     G. Age:  25w 0d        < 3  %    HC/AC:      1.02        1.05 - 1.21  AC:      225.7  mm     G. Age:  27w 0d         41  %  FL/BPD:     78.7   %    71 - 87  FL:       48.1  mm     G. Age:  26w 1d         14  %    FL/AC:      21.3   %    20 - 24  Est. FW:     925  gm      2 lb 1 oz     39  % ---------------------------------------------------------------------- OB History  Gravidity:    3         Prem:   1  TOP:          1        Living:  0 ---------------------------------------------------------------------- Gestational Age  LMP:           29w 0d        Date:  04/19/18                 EDD:   01/24/19  U/S Today:     25w 5d                                        EDD:   02/16/19  Best:          27w 0d     Det. ByMarcella Dubs         EDD:   02/07/19                                      (06/22/18) ---------------------------------------------------------------------- Anatomy  Cranium:               Appears normal         Aortic Arch:            Not well visualized  Cavum:                 Previously seen        Ductal Arch:            Not well visualized  Ventricles:            Appears normal         Diaphragm:              Previously seen  Choroid Plexus:        Previously seen        Stomach:                 Appears normal, left                                                                        sided  Cerebellum:            Previously seen        Abdomen:                Previously seen  Posterior Fossa:       Previously seen  Abdominal Wall:         Previously seen  Nuchal Fold:           Previously seen        Cord Vessels:           Previously seen  Face:                  Orbits and profile     Kidneys:                Appear normal                         previously seen  Lips:                  Previously seen        Bladder:                Appears normal  Thoracic:              Appears normal         Spine:                  Previously seen  Heart:                 Not well visualized    Upper Extremities:      Previously seen  RVOT:                  Appears normal         Lower Extremities:      Previously seen  LVOT:                  Not well visualized  Other:  Heels and 5th digit visualized previously. Parents do not wish to know          sex of fetus. Nasal bone visualized previously. Technically difficult          due to fetal position. ---------------------------------------------------------------------- Cervix Uterus Adnexa  Cervix  Length:            3.5  cm.  Normal appearance by transabdominal scan.  Uterus  No abnormality visualized.  Left Ovary  No adnexal mass visualized.  Right Ovary  No adnexal mass visualized.  Cul De Sac  No free fluid seen.  Adnexa  No abnormality visualized. ---------------------------------------------------------------------- Impression  Chronic hypertension. Well-controlled without medications.  BP at our office today: 107/64 mm Hg.  Maternal obesity.  History of vaginal bleeding and findings of subchorionic  hematoma and low-lying placenta on previous ultrasound.  Fetal growth is appropriate for gestational age. Amniotic fluid  is normal and good fetal activity is seen. Placenta is anterior  and there is no evidence of retroplacental hemorrhage and  the placenta is  not low lying. I reviewed previous image and  the placental location appears posterior (probably fundal).  Cardiac anatomy could not be evaluated because of fetal  position. ---------------------------------------------------------------------- Recommendations  -An appointment was made for her to return in 4 weeks for  fetal growth assessment. ----------------------------------------------------------------------                  Noralee Space, MD Electronically Signed Final Report   11/08/2018 10:13 am ----------------------------------------------------------------------   MAU Course  Procedures  MDM -left flank pain/CVA tenderness (with tender left side of abdomen on palpation)       -1mg  Dilaudid given, pt rates pain 2/10       -  hazy/sm hgb/100PRO, otherwise WNL, urine sent for culture based on hgb       -serum creatinine: 0.66 (was 0.64 4wks ago)       -CBC: WBCs 12.9 (was 10.1 9d ago, 11.8 4wks ago), elevated neutrophils, H/H 9.5/28.2 (known          anemia, has had Feraheme 1 of 2, 2nd scheduled per Dr. Marice Potter progress note 11/09/2018)       -Renal US: normal -N/V       -  Zofran +  Pepcid given IV, pt states N/V resolved       -no ketones in urine       -CMP: WNL for pregnancy -EFM: reactive with variables       -baseline: 140       -variability: moderate       -accels: present, 10x10       -decels: present, few variable       -TOCO: minor irritability -amylase run off existing blood work, results pending at time of discharge per sticky note -no emergent OB-related causes of pain able to be identified -pt discharged to home in stable condition  Orders Placed This Encounter  Procedures  . Culture, OB Urine    Standing Status:   Standing    Number of Occurrences:   1  . US RENAL    Left-sided    Standing Status:   Standing    Number of Occurrences:   1    Order Specific Question:   Symptom/Reason for Exam    Answer:   Costovertebral angle pain [709131]  . Urinalysis,  Routine w reflex microscopic    Standing Status:   Standing    Number of Occurrences:   1  . Comprehensive metabolic panel    Standing Status:   Standing    Number of Occurrences:   1  . CBC with Differential/Platelet    Standing Status:   Standing    Number of Occurrences:   1  . Amylase    Standing Status:   Standing    Number of Occurrences:   1  . Insert peripheral IV    Standing Status:   Standing    Number of Occurrences:   1  . Discharge patient    Order Specific Question:   Discharge disposition    Answer:   01-Home or Self Care [1]    Order Specific Question:   Discharge patient date    Answer:   11/18/2018   Meds ordered this encounter  Medications  . ondansetron (ZOFRAN) 8 mg in sodium chloride 0.9 % 50 mL IVPB  . famotidine (PEPCID) IVPB 20 mg premix  . HYDROmorphone (DILAUDID) injection 1 mg  . lactated ringers infusion  . oxyCODONE-acetaminophen (PERCOCET) 5-325 MG tablet    Sig: Take 1-2 tablets by mouth every 6 (six) hours as needed for up to 10 doses for severe pain.    Dispense:  10 tablet    Refill:  0    Order Specific Question:   Supervising Provider    Answer:   Alysia Penna, MICHAEL L [[id number]]    Assessment and Plan   1. Costovertebral angle tenderness   2. Costovertebral angle pain   3. [redacted] weeks gestation of pregnancy   4. NST (non-stress test) reactive   5. Nausea and vomiting in pregnancy    Allergies as of 11/18/2018      Reactions   Feraheme [ferumoxytol] Nausea And Vomiting   Pork-derived Products  Medication List    TAKE these medications   amitriptyline 50 MG tablet Commonly known as:  ELAVIL Take 1 tablet (50 mg total) by mouth daily.   aspirin 81 MG tablet Take 1 tablet (81 mg total) by mouth daily.   Comfort Fit Maternity Supp Sm Misc 1 Units by Does not apply route daily as needed. [employer] [phone] N. [address], [place] [address]   feeding supplement (ENSURE ENLIVE) Liqd Take 237 mLs by mouth 4 (four)  times daily.   oxyCODONE-acetaminophen 5-325 MG tablet Commonly known as:  Percocet Take 1-2 tablets by mouth every 6 (six) hours as needed for up to 10 doses for severe pain.   pantoprazole 40 MG tablet Commonly known as:  PROTONIX Take 1 tablet (40 mg total) by mouth daily.   prenatal multivitamin Tabs tablet Take 1 tablet by mouth daily at 12 noon.   promethazine 25 MG suppository Commonly known as:  Phenergan Place 1 suppository (25 mg total) rectally every 6 (six) hours as needed for nausea. What changed:  Another medication with the same name was removed. Continue taking this medication, and follow the directions you see here.      -discussed impt of using medication as prescribed/how to take medication -discussed pharmacologic and non-pharmacologic pain relief measures -discussed f/u with primary care/urology for pain, [facility] address/phone number given, pt to call this week for appt -message sent to [facility] to schedule appt with pt this week to discuss pain, pt aware to call office on Tuesday morning if she does not receive call on Monday -discussed s/sx of PTL/return MAU precautions -pt to return for worsening pain/N/V/new symptoms/PRN -pt discharged to home in stable condition  Joni Reining E Nugent 11/18/2018, 4:03 PM

## 2018-11-18 NOTE — Discharge Instructions (Signed)
Back Pain in Pregnancy  Back pain during pregnancy is common. Back pain may be caused by several factors that are related to changes during your pregnancy.  Follow these instructions at home:  Managing pain, stiffness, and swelling          If directed, for sudden (acute) back pain, put ice on the painful area.  ? Put ice in a plastic bag.  ? Place a towel between your skin and the bag.  ? Leave the ice on for 20 minutes, 2-3 times per day.   If directed, apply heat to the affected area before you exercise. Use the heat source that your health care provider recommends, such as a moist heat pack or a heating pad.  ? Place a towel between your skin and the heat source.  ? Leave the heat on for 20-30 minutes.  ? Remove the heat if your skin turns bright red. This is especially important if you are unable to feel pain, heat, or cold. You may have a greater risk of getting burned.   If directed, massage the affected area.  Activity   Exercise as told by your health care provider. Gentle exercise is the best way to prevent or manage back pain.   Listen to your body when lifting. If lifting hurts, ask for help or bend your knees. This uses your leg muscles instead of your back muscles.   Squat down when picking up something from the floor. Do not bend over.   Only use bed rest for short periods as told by your health care provider. Bed rest should only be used for the most severe episodes of back pain.  Standing, sitting, and lying down   Do not stand in one place for long periods of time.   Use good posture when sitting. Make sure your head rests over your shoulders and is not hanging forward. Use a pillow on your lower back if necessary.   Try sleeping on your side, preferably the left side, with a pregnancy support pillow or 1-2 regular pillows between your legs.  ? If you have back pain after a night's rest, your bed may be too soft.  ? A firm mattress may provide more support for your back during  pregnancy.  General instructions   Do not wear high heels.   Eat a healthy diet. Try to gain weight within your health care provider's recommendations.   Use a maternity girdle, elastic sling, or back brace as told by your health care provider.   Take over-the-counter and prescription medicines only as told by your health care provider.   Work with a physical therapist or massage therapist to find ways to manage back pain. Acupuncture or massage therapy may be helpful.   Keep all follow-up visits as told by your health care provider. This is important.  Contact a health care provider if:   Your back pain interferes with your daily activities.   You have increasing pain in other parts of your body.  Get help right away if:   You develop numbness, tingling, weakness, or problems with the use of your arms or legs.   You develop severe back pain that is not controlled with medicine.   You have a change in bowel or bladder control.   You develop shortness of breath, dizziness, or you faint.   You develop nausea, vomiting, or sweating.   You have back pain that is a rhythmic, cramping pain similar to labor pains. Labor   pain is usually 1-2 minutes apart, lasts for about 1 minute, and involves a bearing down feeling or pressure in your pelvis.   You have back pain and your water breaks or you have vaginal bleeding.   You have back pain or numbness that travels down your leg.   Your back pain developed after you fell.   You develop pain on one side of your back.   You see blood in your urine.   You develop skin blisters in the area of your back pain.  Summary   Back pain may be caused by several factors that are related to changes during your pregnancy.   Follow instructions as told by your health care provider for managing pain, stiffness, and swelling.   Exercise as told by your health care provider. Gentle exercise is the best way to prevent or manage back pain.   Take over-the-counter and  prescription medicines only as told by your health care provider.   Keep all follow-up visits as told by your health care provider. This is important.  This information is not intended to replace advice given to you by your health care provider. Make sure you discuss any questions you have with your health care provider.  Document Released: 10/12/2005 Document Revised: 12/20/2017 Document Reviewed: 12/20/2017  Elsevier Interactive Patient Education  2019 [employer]

## 2018-11-20 LAB — CULTURE, OB URINE

## 2018-11-26 ENCOUNTER — Other Ambulatory Visit: Payer: Self-pay

## 2018-11-26 ENCOUNTER — Ambulatory Visit (INDEPENDENT_AMBULATORY_CARE_PROVIDER_SITE_OTHER): Payer: Managed Care, Other (non HMO) | Admitting: Obstetrics & Gynecology

## 2018-11-26 ENCOUNTER — Telehealth: Payer: Self-pay | Admitting: Obstetrics & Gynecology

## 2018-11-26 VITALS — BP 120/69 | HR 82 | Temp 98.2°F | Wt 231.0 lb

## 2018-11-26 DIAGNOSIS — O10913 Unspecified pre-existing hypertension complicating pregnancy, third trimester: Secondary | ICD-10-CM

## 2018-11-26 DIAGNOSIS — Z3A29 29 weeks gestation of pregnancy: Secondary | ICD-10-CM

## 2018-11-26 DIAGNOSIS — O10919 Unspecified pre-existing hypertension complicating pregnancy, unspecified trimester: Secondary | ICD-10-CM

## 2018-11-26 DIAGNOSIS — O364XX0 Maternal care for intrauterine death, not applicable or unspecified: Secondary | ICD-10-CM

## 2018-11-26 DIAGNOSIS — O99013 Anemia complicating pregnancy, third trimester: Secondary | ICD-10-CM

## 2018-11-26 DIAGNOSIS — F1210 Cannabis abuse, uncomplicated: Secondary | ICD-10-CM

## 2018-11-26 DIAGNOSIS — O211 Hyperemesis gravidarum with metabolic disturbance: Secondary | ICD-10-CM

## 2018-11-26 LAB — CBC
Hematocrit: 25.7 % — ABNORMAL LOW (ref 34.0–46.6)
Hemoglobin: 9 g/dL — ABNORMAL LOW (ref 11.1–15.9)
MCH: 33.8 pg — ABNORMAL HIGH (ref 26.6–33.0)
MCHC: 35 g/dL (ref 31.5–35.7)
MCV: 97 fL (ref 79–97)
Platelets: 223 10*3/uL (ref 150–450)
RBC: 2.66 x10E6/uL — CL (ref 3.77–5.28)
RDW: 12.3 % (ref 11.7–15.4)
WBC: 9.6 10*3/uL (ref 3.4–10.8)

## 2018-11-26 MED ORDER — GLYCOPYRROLATE 2 MG PO TABS: 2.0000 mg | ORAL_TABLET | Freq: Three times a day (TID) | ORAL | 3 refills | Status: DC | PRN

## 2018-11-26 MED ORDER — PROMETHAZINE HCL 12.5 MG PO TABS: 12.5000 mg | ORAL_TABLET | Freq: Four times a day (QID) | ORAL | 0 refills | Status: DC | PRN

## 2018-11-26 NOTE — Telephone Encounter (Signed)
The patients check out stated in person visit. The patient is turning 30 weeks. She stated she threw up her last two hour GTT. Rescheduled the patient for the upcoming week as well as scheduled the in person OB as she was already going to be here.

## 2018-11-26 NOTE — Progress Notes (Addendum)
   PRENATAL VISIT NOTE  Subjective:  Tammy Andrews is a 28 y.o. G3P0110 at [redacted]w[redacted]d being seen today for ongoing prenatal care.  She is currently monitored for the following issues for this high-risk pregnancy and has Chronic hypertension during pregnancy, antepartum; Hyperemesis gravidarum before end of [redacted] week gestation with dehydration; Subchorionic hemorrhage in second trimester; Supervision of high risk pregnancy, antepartum; Constipation during pregnancy; Back pain affecting pregnancy; Marijuana abuse; H/O bariatric surgery; Anemia in pregnancy; Vaginal bleeding during pregnancy; Elevated amylase; and Fetal demise in singleton pregnancy greater than [redacted] weeks gestation, antepartum on their problem list.  Patient reports vomiting.  Contractions: Not present. Vag. Bleeding: None.  Movement: Present. Denies leaking of fluid.   The following portions of the patient's history were reviewed and updated as appropriate: allergies, current medications, past family history, past medical history, past social history, past surgical history and problem list.   Objective:   Vitals:   11/26/18 1123  BP: 120/69  Pulse: 82  Temp: 98.2 F (36.8 C)  Weight: 231 lb (104.8 kg)   She smells of marijuana. Fetal Status: Fetal Heart Rate (bpm): 147   Movement: Present     General:  Alert, oriented and cooperative. Patient is in no acute distress.  Skin: Skin is warm and dry. No rash noted.   Cardiovascular: Normal heart rate noted  Respiratory: Normal respiratory effort, no problems with respiration noted  Abdomen: Soft, gravid, appropriate for gestational age.  Pain/Pressure: Present     Pelvic: Cervical exam deferred        Extremities: Normal range of motion.  Edema: None  Mental Status: Normal mood and affect. Normal behavior. Normal judgment and thought content.   Assessment and Plan:  Pregnancy: G3P0110 at [redacted]w[redacted]d 1. Chronic hypertension during pregnancy, antepartum - on baby asa - follow up MFM  u/s 12/09/18  2. Hyperemesis gravidarum before end of [redacted] week gestation with dehydration - The nausea/vomitting starts when she wakes up, generally resolved by lunch time - She tried zofran and reglan but these didn't work, robinul worked earlier so I am prescribing it again. - Taking ensure daily - rec accupressure bands  3. Fetal demise in singleton pregnancy greater than [redacted] weeks gestation, antepartum   4. Marijuana abuse - I have rec'd that she stop this  5. Anemia in pregancy- she had 1 infusion of fereheme and threw up afterwards. She told the infusion people this at the second dose and and they refused to give her the second dose, saying that the vomitting indicates that she was allergic.   Preterm labor symptoms and general obstetric precautions including but not limited to vaginal bleeding, contractions, leaking of fluid and fetal movement were reviewed in detail with the patient. Please refer to After Visit Summary for other counseling recommendations.   No follow-ups on file.  No future appointments.  Allie Bossier, MD

## 2018-11-28 ENCOUNTER — Other Ambulatory Visit: Payer: Self-pay | Admitting: Obstetrics & Gynecology

## 2018-11-30 ENCOUNTER — Encounter: Payer: Self-pay | Admitting: Obstetrics & Gynecology

## 2018-12-02 ENCOUNTER — Inpatient Hospital Stay (HOSPITAL_COMMUNITY)
Admission: AD | Admit: 2018-12-02 | Discharge: 2018-12-03 | Disposition: A | Discharge status: Home / Self Care | Payer: Managed Care, Other (non HMO) | Attending: Obstetrics and Gynecology | Admitting: Obstetrics and Gynecology

## 2018-12-02 ENCOUNTER — Encounter (HOSPITAL_COMMUNITY): Payer: Self-pay | Admitting: *Deleted

## 2018-12-02 ENCOUNTER — Other Ambulatory Visit: Payer: Self-pay

## 2018-12-02 DIAGNOSIS — F12988 Cannabis use, unspecified with other cannabis-induced disorder: Secondary | ICD-10-CM

## 2018-12-02 DIAGNOSIS — B9689 Other specified bacterial agents as the cause of diseases classified elsewhere: Secondary | ICD-10-CM | POA: Diagnosis not present

## 2018-12-02 DIAGNOSIS — F1210 Cannabis abuse, uncomplicated: Secondary | ICD-10-CM | POA: Diagnosis not present

## 2018-12-02 DIAGNOSIS — G8929 Other chronic pain: Secondary | ICD-10-CM

## 2018-12-02 DIAGNOSIS — Z87891 Personal history of nicotine dependence: Secondary | ICD-10-CM | POA: Diagnosis not present

## 2018-12-02 DIAGNOSIS — R109 Unspecified abdominal pain: Secondary | ICD-10-CM

## 2018-12-02 DIAGNOSIS — O368130 Decreased fetal movements, third trimester, not applicable or unspecified: Secondary | ICD-10-CM | POA: Insufficient documentation

## 2018-12-02 DIAGNOSIS — O26893 Other specified pregnancy related conditions, third trimester: Secondary | ICD-10-CM

## 2018-12-02 DIAGNOSIS — O212 Late vomiting of pregnancy: Secondary | ICD-10-CM | POA: Insufficient documentation

## 2018-12-02 DIAGNOSIS — O23593 Infection of other part of genital tract in pregnancy, third trimester: Secondary | ICD-10-CM | POA: Insufficient documentation

## 2018-12-02 DIAGNOSIS — Z3A30 30 weeks gestation of pregnancy: Secondary | ICD-10-CM | POA: Insufficient documentation

## 2018-12-02 DIAGNOSIS — F1290 Cannabis use, unspecified, uncomplicated: Secondary | ICD-10-CM

## 2018-12-02 LAB — COMPREHENSIVE METABOLIC PANEL
ALT: 11 U/L (ref 0–44)
AST: 15 U/L (ref 15–41)
Albumin: 3.1 g/dL — ABNORMAL LOW (ref 3.5–5.0)
Alkaline Phosphatase: 60 U/L (ref 38–126)
Anion gap: 11 (ref 5–15)
BUN: 10 mg/dL (ref 6–20)
CO2: 19 mmol/L — ABNORMAL LOW (ref 22–32)
Calcium: 8.8 mg/dL — ABNORMAL LOW (ref 8.9–10.3)
Chloride: 105 mmol/L (ref 98–111)
Creatinine, Ser: 0.88 mg/dL (ref 0.44–1.00)
GFR calc Af Amer: >60 mL/min (ref >60)
GFR calc non Af Amer: >60 mL/min (ref >60)
Glucose, Bld: 110 mg/dL — ABNORMAL HIGH (ref 70–99)
Potassium: 3.6 mmol/L (ref 3.5–5.1)
Sodium: 135 mmol/L (ref 135–145)
Total Bilirubin: 0.6 mg/dL (ref 0.3–1.2)
Total Protein: 6.8 g/dL (ref 6.5–8.1)

## 2018-12-02 LAB — WET PREP, GENITAL
Sperm: NONE SEEN
Trich, Wet Prep: NONE SEEN
Yeast Wet Prep HPF POC: NONE SEEN

## 2018-12-02 LAB — CBC
HCT: 27.9 % — ABNORMAL LOW (ref 36.0–46.0)
Hemoglobin: 9.7 g/dL — ABNORMAL LOW (ref 12.0–15.0)
MCH: 33 pg (ref 26.0–34.0)
MCHC: 34.8 g/dL (ref 30.0–36.0)
MCV: 94.9 fL (ref 80.0–100.0)
Platelets: 224 10*3/uL (ref 150–400)
RBC: 2.94 MIL/uL — ABNORMAL LOW (ref 3.87–5.11)
RDW: 12 % (ref 11.5–15.5)
WBC: 12.6 10*3/uL — ABNORMAL HIGH (ref 4.0–10.5)
nRBC: 0 % (ref 0.0–0.2)

## 2018-12-02 LAB — AMYLASE: Amylase: 130 U/L — ABNORMAL HIGH (ref 28–100)

## 2018-12-02 MED ORDER — OXYCODONE-ACETAMINOPHEN 5-325 MG PO TABS
2.0000 | ORAL_TABLET | Freq: Once | ORAL | Status: AC
  Administered 2018-12-02: 2 via ORAL
  Filled 2018-12-02: qty 2

## 2018-12-02 MED ORDER — METRONIDAZOLE 0.75 % VA GEL: 1.0000 | Freq: Every day | VAGINAL | 0 refills | Status: DC

## 2018-12-02 MED ORDER — HYDROMORPHONE HCL 1 MG/ML IJ SOLN
1.0000 mg | Freq: Once | INTRAMUSCULAR | Status: AC
  Administered 2018-12-02: 1 mg via INTRAMUSCULAR
  Filled 2018-12-02: qty 1

## 2018-12-02 MED ORDER — PROMETHAZINE HCL 25 MG PO TABS: 25.0000 mg | ORAL_TABLET | Freq: Once | ORAL | Status: DC

## 2018-12-02 MED ORDER — HYDROMORPHONE HCL 1 MG/ML IJ SOLN: 1.0000 mg | Freq: Once | INTRAMUSCULAR | Status: DC

## 2018-12-02 MED ORDER — ONDANSETRON 4 MG PO TBDP
8.0000 mg | ORAL_TABLET | Freq: Once | ORAL | Status: AC
  Administered 2018-12-02: 8 mg via ORAL
  Filled 2018-12-02: qty 2

## 2018-12-02 MED ORDER — ACETAMINOPHEN 500 MG PO TABS: 1000.0000 mg | ORAL_TABLET | Freq: Once | ORAL | Status: DC

## 2018-12-02 MED ORDER — PROMETHAZINE HCL 25 MG RE SUPP
25.0000 mg | Freq: Once | RECTAL | Status: AC
  Administered 2018-12-02: 25 mg via RECTAL
  Filled 2018-12-02: qty 1

## 2018-12-02 NOTE — MAU Note (Signed)
Pt moving in bed not wanting to wear monitors. Provider at bedside.

## 2018-12-02 NOTE — MAU Note (Signed)
Informed pt she we need to put baby on monitor pt states not yet. meds given

## 2018-12-02 NOTE — MAU Note (Signed)
PT sitting up in bed vomiting unable to lean back. Unable to obtain FHT at this time.

## 2018-12-02 NOTE — MAU Note (Signed)
Pt reports to MAU c/o vomiting and abdominal pain that has been ongoing. Pt reports no FM since 0000 last night. Pt uses majauna and last used on Monday. Pt reports vomiting x8 times today. Pt reports pressure in her left side of her abdomen and back pt states the pain is a 10/10.

## 2018-12-02 NOTE — Discharge Instructions (Signed)
Abdominal Pain During Pregnancy ° °Abdominal pain is common during pregnancy, and has many possible causes. Some causes are more serious than others, and sometimes the cause is not known. Abdominal pain can be a sign that labor is starting. It can also be caused by normal growth and stretching of muscles and ligaments during pregnancy. Always tell your health care provider if you have any abdominal pain. °Follow these instructions at home: °· Do not have sex or put anything in your vagina until your pain goes away completely. °· Get plenty of rest until your pain improves. °· Drink enough fluid to keep your urine pale yellow. °· Take over-the-counter and prescription medicines only as told by your health care provider. °· Keep all follow-up visits as told by your health care provider. This is important. °Contact a health care provider if: °· Your pain continues or gets worse after resting. °· You have lower abdominal pain that: °? Comes and goes at regular intervals. °? Spreads to your back. °? Is similar to menstrual cramps. °· You have pain or burning when you urinate. °Get help right away if: °· You have a fever or chills. °· You have vaginal bleeding. °· You are leaking fluid from your vagina. °· You are passing tissue from your vagina. °· You have vomiting or diarrhea that lasts for more than 24 hours. °· Your baby is moving less than usual. °· You feel very weak or faint. °· You have shortness of breath. °· You develop severe pain in your upper abdomen. °Summary °· Abdominal pain is common during pregnancy, and has many possible causes. °· If you experience abdominal pain during pregnancy, tell your health care provider right away. °· Follow your health care provider's home care instructions and keep all follow-up visits as directed. °This information is not intended to replace advice given to you by your health care provider. Make sure you discuss any questions you have with your health care  provider. °Document Released: 07/04/2005 Document Revised: 10/06/2016 Document Reviewed: 10/06/2016 °Elsevier Interactive Patient Education © 2019 Elsevier Inc. ° °

## 2018-12-02 NOTE — Progress Notes (Signed)
Pt took off monitors because she states "it hurts" and refused being still for placement. She was educated on the importance and why we want fetal monitoring, but still pt refuses.

## 2018-12-02 NOTE — MAU Provider Note (Signed)
History     CSN: [id number]  Arrival date and time: 12/02/18 2001   First Provider Initiated Contact with Patient 12/02/18 2110      Chief Complaint  Patient presents with  . Abdominal Pain  . Decreased Fetal Movement   Tammy Andrews is a 28 y.o. G3P0110 at [redacted]w[redacted]d who presents for Abdominal Pain and Decreased Fetal Movement.  Patient tearful and reports pain on her left side of her body in both the front and the back.  She describes the pain as a constant stabbing pain that started yesterday morning.  Patient endorses that this pain has been consistent throughout the pregnancy and declines an [place] stating "they always do that and it's nothing." She reports trying to take tylenol, but has been throwing up.  She reports that she has been experiencing N/V and has been taking phenergan and protonix with relief.  She reports her last dose was this morning.  She denies recent sexual activity, but reports spotting this morning without vaginal discharge.  She is unsure of contractions, but endorses fetal movement. Patient further reports vaginal pain, stating "it feels like something is in my vagina."        OB History    Gravida  3   Para  1   Term  0   Preterm  1   AB  1   Living  0     SAB  0   TAB  1   Ectopic  0   Multiple  0   Live Births  0        Obstetric Comments  G2: per pt, approx 24wks, woke with bleeding (no pain) and dx with IUFD when went to hospital. IOL SVD        Past Medical History:  Diagnosis Date  . Anemia   . Anxiety   . Asthma    childhood  . Depression   . Hypertension   . Preterm labor     Past Surgical History:  Procedure Laterality Date  . DILATION AND CURETTAGE OF UTERUS    . LAPAROSCOPIC GASTRIC SLEEVE RESECTION  08/2014    Family History  Problem Relation Age of Onset  . Hypertension Father   . Diabetes Paternal Grandmother     Social History   Tobacco Use  . Smoking status: Former Smoker    Last attempt to quit:  2018    Years since quitting: 2.3  . Smokeless tobacco: Never Used  Substance Use Topics  . Alcohol use: Not Currently    Comment: not in pregnancy  . Drug use: Not Currently    Types: Marijuana    Comment: last used 11/26/18    Allergies:  Allergies  Allergen Reactions  . Feraheme [Ferumoxytol] Nausea And Vomiting  . Pork-Derived Products     Medications Prior to Admission  Medication Sig Dispense Refill Last Dose  . amitriptyline (ELAVIL) 50 MG tablet Take 1 tablet (50 mg total) by mouth daily. 20 tablet 1 12/02/2018 at Unknown time  . aspirin 81 MG tablet Take 1 tablet (81 mg total) by mouth daily. 30 tablet 12 Past Week at Unknown time  . feeding supplement, ENSURE ENLIVE, (ENSURE ENLIVE) LIQD Take 237 mLs by mouth 4 (four) times daily. 56 Bottle 3 12/02/2018 at Unknown time  . glycopyrrolate (ROBINUL) 2 MG tablet Take 1 tablet (2 mg total) by mouth 3 (three) times daily as needed. 30 tablet 3 12/02/2018 at Unknown time  . oxyCODONE-acetaminophen (PERCOCET) 5-325 MG tablet Take  1-2 tablets by mouth every 6 (six) hours as needed for up to 10 doses for severe pain. 10 tablet 0 Past Week at Unknown time  . pantoprazole (PROTONIX) 40 MG tablet Take 1 tablet (40 mg total) by mouth daily. 60 tablet 1 12/02/2018 at Unknown time  . Prenatal Vit-Fe Fumarate-FA (PRENATAL MULTIVITAMIN) TABS tablet Take 1 tablet by mouth daily at 12 noon. 30 tablet 1 Past Month at Unknown time  . promethazine (PHENERGAN) 12.5 MG tablet Take 1 tablet (12.5 mg total) by mouth every 6 (six) hours as needed for nausea or vomiting. 30 tablet 0 12/02/2018 at Unknown time  . Elastic Bandages & Supports (COMFORT FIT MATERNITY SUPP SM) MISC 1 Units by Does not apply route daily as needed. [employer] [phone] [address] [place] [address] 1 each 0 Unknown at Unknown time  . promethazine (PHENERGAN) 25 MG suppository Place 1 suppository (25 mg total) rectally every 6 (six) hours as needed for nausea. 12  suppository 1 Unknown at Unknown time    Review of Systems  Constitutional: Positive for chills. Negative for fever.  Respiratory: Negative for cough and shortness of breath.   Gastrointestinal: Positive for abdominal pain, nausea and vomiting. Negative for constipation and diarrhea.  Genitourinary: Positive for flank pain (Left Side), vaginal bleeding (Spotting this morning) and vaginal pain (Pressure). Negative for difficulty urinating, dysuria, urgency and vaginal discharge.  Musculoskeletal: Positive for back pain.  Neurological: Positive for headaches. Negative for dizziness and light-headedness.   Physical Exam   Last menstrual period 04/19/2018, SpO2 100 %, unknown if currently breastfeeding.  Physical Exam  Constitutional: She is oriented to person, place, and time. She appears well-developed and well-nourished. She appears distressed.  HENT:  Head: Normocephalic and atraumatic.  Eyes: Conjunctivae are normal.  Neck: Normal range of motion.  Cardiovascular: Normal rate, regular rhythm and normal heart sounds.  Respiratory: Effort normal and breath sounds normal. No respiratory distress.  GI: Soft. Bowel sounds are normal. She exhibits no mass. There is no rebound and no guarding.  Genitourinary: Cervix exhibits motion tenderness.    Vaginal discharge present.     No vaginal bleeding.  No bleeding in the vagina.    Genitourinary Comments: Speculum Exam: -Vaginal Vault: Pink mucosa.  Moderate amt white frothy discharge in vault -wet prep collected -Cervix:Difficult to visualize d/t side lying position.  GC/CT collected -Bimanual Exam: Closed/Long/Soft Mild tenderness in cul de sac-left side    Musculoskeletal: Normal range of motion.  Neurological: She is alert and oriented to person, place, and time.  Skin: Skin is warm and dry.  Psychiatric: She has a normal mood and affect. Her behavior is normal.    Fetal Assessment 130 bpm, Mod Var, -Decels, -Accels Toco: None  graphed  MAU Course   Results for orders placed or performed during the hospital encounter of 12/02/18 (from the past 24 hour(s))  Wet prep, genital     Status: Abnormal   Collection Time: 12/02/18  9:59 PM  Result Value Ref Range   Yeast Wet Prep HPF POC NONE SEEN NONE SEEN   Trich, Wet Prep NONE SEEN NONE SEEN   Clue Cells Wet Prep HPF POC PRESENT (A) NONE SEEN   WBC, Wet Prep HPF POC MANY (A) NONE SEEN   Sperm NONE SEEN    No results found.  MDM PE Labs:UA, Wet prep, GC/CT PreE Labs EFM  Assessment and Plan  28 year old G3P0110 at 30.3weeks Left Abdominal Pain  -Exam findings  discussed. -Patient informed findings c/w bacterial vaginosis and educated on treatment. -Offered oral vs vaginal treatment and patient prefers vaginal. -Rx for Metrogel 0.75% PV QHS x 5days to be sent to pharmacy on file.  -Will give dilaudid  now for pain. -Previous labs and [place] reviewed; No significant renal findings. -Will order repeat PIH labs with amylase and cancel 5/19 lab visit at 0850.   Follow Up (11:02 PM) -Wet prep returns significant for clue cells. -Results discussed with patient. -Patient reports mild improvement (8/10) in pain with dilaudid dosing. -Continues to have emesis, but declines antiemetics. -Offered and accepts additional pain medication, patient endorses relief with percocet dosing. -Patient reports that she "takes her nausea medication in the tub to keep it down." -Informed that no tub available here and she would need to keep down for 20 minutes; patient states this is no problem. -2 tablets percocet ordered. -Labs pending.  -Will await until 20 minutes after percocet dosing for discharge.   Follow Up (11:46 PM)  -Patient with vomiting almost immediately after percocet dosing. -Provider in room to assess. -Patient reports usage of MJ ~2-3x week.  Encouraged cessation.  -Informed that symptoms of N/V are directly correlated to MJ usage and left flank pain  may be related to vomiting. -Offered and initially declines phenergan.  Provider informed that it can be given rectally and patient agreeable. -Patient requests discharge after suppository placement. -Will place discharge orders. -Reiterated need for follow up in office on May 19th at 1015. -PTL precautions given. -Encouraged to call or return to MAU if symptoms worsen or with the onset of new symptoms. -Discharged to home in stable condition  Cherre Robins MSN, CNM 12/02/2018, 9:11 PM

## 2018-12-03 ENCOUNTER — Inpatient Hospital Stay (HOSPITAL_COMMUNITY)
Admission: AD | Admit: 2018-12-03 | Discharge: 2018-12-03 | Discharge status: Against Medical Advice | Payer: Managed Care, Other (non HMO) | Source: Home / Self Care | Attending: Family Medicine | Admitting: Family Medicine

## 2018-12-03 ENCOUNTER — Other Ambulatory Visit: Payer: Self-pay

## 2018-12-03 ENCOUNTER — Telehealth: Payer: Self-pay | Admitting: Obstetrics and Gynecology

## 2018-12-03 ENCOUNTER — Other Ambulatory Visit: Payer: Managed Care, Other (non HMO)

## 2018-12-03 DIAGNOSIS — O99612 Diseases of the digestive system complicating pregnancy, second trimester: Secondary | ICD-10-CM

## 2018-12-03 DIAGNOSIS — O99891 Other specified diseases and conditions complicating pregnancy: Secondary | ICD-10-CM

## 2018-12-03 DIAGNOSIS — Z5321 Procedure and treatment not carried out due to patient leaving prior to being seen by health care provider: Secondary | ICD-10-CM | POA: Insufficient documentation

## 2018-12-03 DIAGNOSIS — O26893 Other specified pregnancy related conditions, third trimester: Secondary | ICD-10-CM | POA: Insufficient documentation

## 2018-12-03 DIAGNOSIS — Z3A30 30 weeks gestation of pregnancy: Secondary | ICD-10-CM | POA: Insufficient documentation

## 2018-12-03 DIAGNOSIS — M549 Dorsalgia, unspecified: Secondary | ICD-10-CM

## 2018-12-03 DIAGNOSIS — K5900 Constipation, unspecified: Secondary | ICD-10-CM

## 2018-12-03 DIAGNOSIS — O0990 Supervision of high risk pregnancy, unspecified, unspecified trimester: Secondary | ICD-10-CM

## 2018-12-03 DIAGNOSIS — O364XX0 Maternal care for intrauterine death, not applicable or unspecified: Secondary | ICD-10-CM

## 2018-12-03 LAB — URINALYSIS, ROUTINE W REFLEX MICROSCOPIC
Glucose, UA: NEGATIVE mg/dL
Hgb urine dipstick: NEGATIVE
Ketones, ur: 5 mg/dL — AB
Nitrite: NEGATIVE
Protein, ur: >=300 mg/dL — AB
Specific Gravity, Urine: 1.034 — ABNORMAL HIGH (ref 1.005–1.030)
pH: 5 (ref 5.0–8.0)

## 2018-12-03 LAB — PROTEIN / CREATININE RATIO, URINE
Creatinine, Urine: 700.3 mg/dL
Protein Creatinine Ratio: 1.86 mg/mg{Cre} — ABNORMAL HIGH (ref 0.00–0.15)
Total Protein, Urine: 1300 mg/dL

## 2018-12-03 LAB — GC/CHLAMYDIA PROBE AMP (~~LOC~~) NOT AT ARMC
Chlamydia: NEGATIVE
Neisseria Gonorrhea: NEGATIVE

## 2018-12-03 NOTE — MAU Note (Signed)
Pt not in lobby.  

## 2018-12-03 NOTE — Telephone Encounter (Signed)
Called the patient to inform of upcoming visit, left a detailed voicemail of appointment info and new location address. °

## 2018-12-03 NOTE — MAU Note (Signed)
Pt having pain in her left side. Said she went home, went to sleep and when she woke up she threw up. Not able to keep meds down. Was discharged early am. Says she's not throwing up right now but having the pain in her side. Rates pain 7/10. Still smoking marijuana when she can't eat so she can eat again.

## 2018-12-04 ENCOUNTER — Other Ambulatory Visit: Payer: Managed Care, Other (non HMO)

## 2018-12-04 ENCOUNTER — Ambulatory Visit (INDEPENDENT_AMBULATORY_CARE_PROVIDER_SITE_OTHER): Payer: Managed Care, Other (non HMO) | Admitting: Student

## 2018-12-04 VITALS — BP 109/62 | HR 87 | Temp 98.4°F | Wt 227.4 lb

## 2018-12-04 DIAGNOSIS — O0993 Supervision of high risk pregnancy, unspecified, third trimester: Secondary | ICD-10-CM

## 2018-12-04 DIAGNOSIS — O0990 Supervision of high risk pregnancy, unspecified, unspecified trimester: Secondary | ICD-10-CM

## 2018-12-04 DIAGNOSIS — Z3A30 30 weeks gestation of pregnancy: Secondary | ICD-10-CM

## 2018-12-04 MED ORDER — FERROUS FUMARATE-FOLIC ACID 324-1 MG PO TABS: 324.0000 mg | ORAL_TABLET | Freq: Every day | ORAL | 1 refills | Status: DC

## 2018-12-04 MED ORDER — CYCLOBENZAPRINE HCL 10 MG PO TABS: 10.0000 mg | ORAL_TABLET | Freq: Three times a day (TID) | ORAL | 0 refills | Status: DC | PRN

## 2018-12-04 NOTE — Patient Instructions (Signed)
.  Preeclampsia and Eclampsia    Preeclampsia is a serious condition that may develop during pregnancy. It is also called toxemia of pregnancy. This condition causes high blood pressure along with other symptoms, such as swelling and headaches. These symptoms may develop as the condition gets worse. Preeclampsia may occur at 20 weeks of pregnancy or later.  Diagnosing and treating preeclampsia early is very important. If not treated early, it can cause serious problems for you and your baby. One problem it can lead to is eclampsia. Eclampsia is a condition that causes muscle jerking or shaking (convulsions or seizures) and other serious problems for the mother. During pregnancy, delivering your baby may be the best treatment for preeclampsia or eclampsia. For most women, preeclampsia and eclampsia symptoms go away after giving birth.  In rare cases, a woman may develop preeclampsia after giving birth (postpartum preeclampsia). This usually occurs within 48 hours after childbirth but may occur up to 6 weeks after giving birth.  What are the causes?  The cause of preeclampsia is not known.  What increases the risk?  The following risk factors make you more likely to develop preeclampsia:   Being pregnant for the first time.   Having had preeclampsia during a past pregnancy.   Having a family history of preeclampsia.   Having high blood pressure.   Being pregnant with more than one baby.   Being 35 or older.   Being African-American.   Having kidney disease or diabetes.   Having medical conditions such as lupus or blood diseases.   Being very overweight (obese).  What are the signs or symptoms?  The earliest signs of preeclampsia are:   High blood pressure.   Increased protein in your urine. Your health care provider will check for this at every visit before you give birth (prenatal visit).  Other symptoms that may develop as the condition gets worse include:   Severe headaches.   Sudden weight  gain.   Swelling of the hands, face, legs, and feet.   Nausea and vomiting.   Vision problems, such as blurred or double vision.   Numbness in the face, arms, legs, and feet.   Urinating less than usual.   Dizziness.   Slurred speech.   Abdominal pain, especially upper abdominal pain.   Convulsions or seizures.  How is this diagnosed?  There are no screening tests for preeclampsia. Your health care provider will ask you about symptoms and check for signs of preeclampsia during your prenatal visits. You may also have tests that include:   Urine tests.   Blood tests.   Checking your blood pressure.   Monitoring your baby's heart rate.   Ultrasound.  How is this treated?  You and your health care provider will determine the treatment approach that is best for you. Treatment may include:   Having more frequent prenatal exams to check for signs of preeclampsia, if you have an increased risk for preeclampsia.   Medicine to lower your blood pressure.   Staying in the hospital, if your condition is severe. There, treatment will focus on controlling your blood pressure and the amount of fluids in your body (fluid retention).   Taking medicine (magnesium sulfate) to prevent seizures. This may be given as an injection or through an IV.   Taking a low-dose aspirin during your pregnancy.   Delivering your baby early, if your condition gets worse. You may have your labor started with medicine (induced), or you may have a cesarean   delivery.  Follow these instructions at home:  Eating and drinking     Drink enough fluid to keep your urine pale yellow.   Avoid caffeine.  Lifestyle   Do not use any products that contain nicotine or tobacco, such as cigarettes and e-cigarettes. If you need help quitting, ask your health care provider.   Do not use alcohol or drugs.   Avoid stress as much as possible. Rest and get plenty of sleep.  General instructions   Take over-the-counter and prescription medicines only as  told by your health care provider.   When lying down, lie on your left side. This keeps pressure off your major blood vessels.   When sitting or lying down, raise (elevate) your feet. Try putting some pillows underneath your lower legs.   Exercise regularly. Ask your health care provider what kinds of exercise are best for you.   Keep all follow-up and prenatal visits as told by your health care provider. This is important.  How is this prevented?  There is no known way of preventing preeclampsia or eclampsia from developing. However, to lower your risk of complications and detect problems early:   Get regular prenatal care. Your health care provider may be able to diagnose and treat the condition early.   Maintain a healthy weight. Ask your health care provider for help managing weight gain during pregnancy.   Work with your health care provider to manage any long-term (chronic) health conditions you have, such as diabetes or kidney problems.   You may have tests of your blood pressure and kidney function after giving birth.   Your health care provider may have you take low-dose aspirin during your next pregnancy.  Contact a health care provider if:   You have symptoms that your health care provider told you may require more treatment or monitoring, such as:  ? Headaches.  ? Nausea or vomiting.  ? Abdominal pain.  ? Dizziness.  ? Light-headedness.  Get help right away if:   You have severe:  ? Abdominal pain.  ? Headaches that do not get better.  ? Dizziness.  ? Vision problems.  ? Confusion.  ? Nausea or vomiting.   You have any of the following:  ? A seizure.  ? Sudden, rapid weight gain.  ? Sudden swelling in your hands, ankles, or face.  ? Trouble moving any part of your body.  ? Numbness in any part of your body.  ? Trouble speaking.  ? Abnormal bleeding.   You faint.  Summary   Preeclampsia is a serious condition that may develop during pregnancy. It is also called toxemia of pregnancy.   This  condition causes high blood pressure along with other symptoms, such as swelling and headaches.   Diagnosing and treating preeclampsia early is very important. If not treated early, it can cause serious problems for you and your baby.   Get help right away if you have symptoms that your health care provider told you to watch for.  This information is not intended to replace advice given to you by your health care provider. Make sure you discuss any questions you have with your health care provider.  Document Released: 07/01/2000 Document Revised: 06/20/2017 Document Reviewed: 02/08/2016  Elsevier Interactive Patient Education  2019 Elsevier Inc[employer]

## 2018-12-04 NOTE — Progress Notes (Signed)
PRENATAL VISIT NOTE  Subjective:  Tammy Andrews is a 28 y.o. G3P0110 at [redacted]w[redacted]d being seen today for ongoing prenatal care.  She is currently monitored for the following issues for this low-risk pregnancy and has Chronic hypertension during pregnancy, antepartum; Hyperemesis gravidarum before end of [redacted] week gestation with dehydration; Subchorionic hemorrhage in second trimester; Supervision of high risk pregnancy, antepartum; Constipation during pregnancy; Back pain affecting pregnancy; Marijuana abuse; H/O bariatric surgery; Anemia in pregnancy; Vaginal bleeding during pregnancy; Elevated amylase; and Fetal demise in singleton pregnancy greater than [redacted] weeks gestation, antepartum on their problem list.  Patient reports sharp pains in her left side. Denies dysuria, frequent urination. She has a maternity belt and it is not helping. She is not working and she is home all the time. She was seen in MAU a few weeks ago and given percocet, which helped but she has run out of medicine. She is still taking her phenergan twice a day; it helps but she still throws up frequently. She is not taking any medicines for her BP and it has been normal on [facility], although she forget to log a BP this week. .  Contractions: Irritability. Vag. Bleeding: None.  Movement: Present. Denies leaking of fluid.   The following portions of the patient's history were reviewed and updated as appropriate: allergies, current medications, past family history, past medical history, past social history, past surgical history and problem list.   Objective:   Vitals:   12/04/18 1120  BP: 109/62  Pulse: 87  Temp: 98.4 F (36.9 C)  Weight: 227 lb 6.4 oz (103.1 kg)    Fetal Status: Fetal Heart Rate (bpm): 139 Fundal Height: 30 cm Movement: Present     General:  Alert, oriented and cooperative. Patient is in no acute distress.  Skin: Skin is warm and dry. No rash noted.   Cardiovascular: Normal heart rate noted  Respiratory:  Normal respiratory effort, no problems with respiration noted  Abdomen: Soft, gravid, appropriate for gestational age.  Pain/Pressure: Present     Pelvic: Cervical exam deferred        Extremities: Normal range of motion.  Edema: None  Mental Status: Normal mood and affect. Normal behavior. Normal judgment and thought content.   Assessment and Plan:  Pregnancy: G3P0110 at [redacted]w[redacted]d 1. Supervision of high risk pregnancy, antepartum Per OB note, needs repeat amylase and PC ratio, will collect today.  -Patient is anemic and threw up during Feraheme infusion, will recheck CBC and ferritin today. FeSo4 ordered for patient to try.  -Patient vomited up her 2 hour previously, however, she thinks she can try a 1 hour today.  -Encouraged MJ cessation -She agrees to continue logging her BP in [facility]; gave her warning signs of pre-e.  She says her BP has been good and it is only elevated when she is throwing up. Had two elevated readings when she was throwing up in April.  -Flexeril for pain; recommended yoga and massage. Patient is at home with her husband and states she she can do yoga and massage with his help.  - Amylase - Protein / creatinine ratio, urine - CBC - Ferritin - Glucose Tolerance, 1 Hour  Preterm labor symptoms and general obstetric precautions including but not limited to vaginal bleeding, contractions, leaking of fluid and fetal movement were reviewed in detail with the patient. Please refer to After Visit Summary for other counseling recommendations.   Return 2 weeks for My Chart visit.  Future Appointments  Date Time  Provider Department Center  12/18/2018  2:15 PM Marny LowensteinWenzel, Julie N, PA-C [facility]    Charlesetta GaribaldiKathryn Lorraine BadgerKooistra, [place]

## 2018-12-05 LAB — PROTEIN / CREATININE RATIO, URINE
Creatinine, Urine: 437.3 mg/dL
Protein, Ur: 183.4 mg/dL
Protein/Creat Ratio: 419 mg/g creat — ABNORMAL HIGH (ref 0–200)

## 2018-12-05 LAB — CBC
Hematocrit: 26.8 % — ABNORMAL LOW (ref 34.0–46.6)
Hemoglobin: 9.3 g/dL — ABNORMAL LOW (ref 11.1–15.9)
MCH: 32.7 pg (ref 26.6–33.0)
MCHC: 34.7 g/dL (ref 31.5–35.7)
MCV: 94 fL (ref 79–97)
Platelets: 239 10*3/uL (ref 150–450)
RBC: 2.84 x10E6/uL — ABNORMAL LOW (ref 3.77–5.28)
RDW: 12.1 % (ref 11.7–15.4)
WBC: 12 10*3/uL — ABNORMAL HIGH (ref 3.4–10.8)

## 2018-12-05 LAB — HIV ANTIBODY (ROUTINE TESTING W REFLEX): HIV Screen 4th Generation wRfx: NONREACTIVE

## 2018-12-05 LAB — FERRITIN: Ferritin: 458 ng/mL — ABNORMAL HIGH (ref 15–150)

## 2018-12-05 LAB — AMYLASE: Amylase: 116 U/L — ABNORMAL HIGH (ref 31–110)

## 2018-12-05 LAB — GLUCOSE TOLERANCE, 1 HOUR: Glucose, 1Hr PP: 82 mg/dL (ref 65–199)

## 2018-12-05 LAB — RPR: RPR Ser Ql: NONREACTIVE

## 2018-12-17 ENCOUNTER — Telehealth: Payer: Self-pay | Admitting: Advanced Practice Midwife

## 2018-12-17 NOTE — Telephone Encounter (Signed)
Called the patient to confirm the appointment. The patient verbalized understanding. °

## 2018-12-18 ENCOUNTER — Other Ambulatory Visit: Payer: Self-pay

## 2018-12-18 ENCOUNTER — Telehealth (INDEPENDENT_AMBULATORY_CARE_PROVIDER_SITE_OTHER): Payer: Managed Care, Other (non HMO)

## 2018-12-18 DIAGNOSIS — O9989 Other specified diseases and conditions complicating pregnancy, childbirth and the puerperium: Secondary | ICD-10-CM

## 2018-12-18 DIAGNOSIS — M549 Dorsalgia, unspecified: Secondary | ICD-10-CM

## 2018-12-18 DIAGNOSIS — K5900 Constipation, unspecified: Secondary | ICD-10-CM

## 2018-12-18 DIAGNOSIS — O364XX0 Maternal care for intrauterine death, not applicable or unspecified: Secondary | ICD-10-CM

## 2018-12-18 DIAGNOSIS — Z3A32 32 weeks gestation of pregnancy: Secondary | ICD-10-CM

## 2018-12-18 DIAGNOSIS — O99891 Other specified diseases and conditions complicating pregnancy: Secondary | ICD-10-CM

## 2018-12-18 DIAGNOSIS — O0990 Supervision of high risk pregnancy, unspecified, unspecified trimester: Secondary | ICD-10-CM

## 2018-12-18 DIAGNOSIS — O0993 Supervision of high risk pregnancy, unspecified, third trimester: Secondary | ICD-10-CM

## 2018-12-18 DIAGNOSIS — O99613 Diseases of the digestive system complicating pregnancy, third trimester: Secondary | ICD-10-CM

## 2018-12-18 MED ORDER — PROCHLORPERAZINE 25 MG RE SUPP: 25.0000 mg | Freq: Two times a day (BID) | RECTAL | 0 refills | Status: DC | PRN

## 2018-12-18 NOTE — Progress Notes (Signed)
TELEHEALTH VIRTUAL OBSTETRICS VISIT ENCOUNTER NOTE  I connected with Tammy Andrews on 12/18/18 at  2:15 PM EDT by telephone at home and verified that I am speaking with the correct person using two identifiers.   I discussed the limitations, risks, security and privacy concerns of performing an evaluation and management service by telephone and the availability of in person appointments. I also discussed with the patient that there may be a patient responsible charge related to this service. The patient expressed understanding and agreed to proceed.  Subjective:  Tammy Andrews is a 28 y.o. G3P0110 at [redacted]w[redacted]d being followed for ongoing prenatal care.  She is currently monitored for the following issues for this high-risk pregnancy and has Chronic hypertension during pregnancy, antepartum; Hyperemesis gravidarum before end of [redacted] week gestation with dehydration; Subchorionic hemorrhage in second trimester; Supervision of high risk pregnancy, antepartum; Constipation during pregnancy; Back pain affecting pregnancy; Marijuana abuse; H/O bariatric surgery; Anemia in pregnancy; Vaginal bleeding during pregnancy; Elevated amylase; and Fetal demise in singleton pregnancy greater than [redacted] weeks gestation, antepartum on their problem list.  Patient reports vomiting. Reports fetal movement. Denies any contractions, bleeding or leaking of fluid.   The following portions of the patient's history were reviewed and updated as appropriate: allergies, current medications, past family history, past medical history, past social history, past surgical history and problem list.   Objective:   General:  Alert, oriented and cooperative.   Mental Status: Normal mood and affect perceived. Normal judgment and thought content.  Rest of physical exam deferred due to type of encounter  Assessment and Plan:  Pregnancy: G3P0110 at [redacted]w[redacted]d 1. Supervision of high risk pregnancy, antepartum -BP 111/73 -Amylase and PCR  improved from MAU visit -Reviewed normal labs with patient  -Patient continuing to vomit 8-9 times a day. Will try compazine for nausea and again encouraged MJ cessation -Patient states Dr. Marice Potter told her she could come out of work but then "changed her mind." No documented conversation of stopping work at this time. Weight stable even though patient is vomiting. Will give patient a work note with restrictions and encouraged patient to use nausea medication especially at work.  - Last u/s on 4/23 recommended f/u growth in 4 weeks but not scheduled. Will reorder - [place] MFM OB FOLLOW UP; Future  -Anticipatory guidance of next visit in person for GBS reviewed with patient  2. Fetal demise in singleton pregnancy greater than [redacted] weeks gestation, antepartum   3. Constipation during pregnancy in second trimester -Patient reports resolution since stopping zofran  4. Back pain affecting pregnancy, antepartum -States it is sometimes better than when she was last seen in MAU  Preterm labor symptoms and general obstetric precautions including but not limited to vaginal bleeding, contractions, leaking of fluid and fetal movement were reviewed in detail with the patient.  I discussed the assessment and treatment plan with the patient. The patient was provided an opportunity to ask questions and all were answered. The patient agreed with the plan and demonstrated an understanding of the instructions. The patient was advised to call back or seek an in-person office evaluation/go to MAU at [facility] for any urgent or concerning symptoms. Please refer to After Visit Summary for other counseling recommendations.   I provided 15 minutes of non-face-to-face time during this encounter.  Return in about 4 weeks (around 01/15/2019) for Return OB visit/GBS.  No future appointments.  Rolm Bookbinder, CNM [facility] for [employer], [facility]

## 2018-12-18 NOTE — Progress Notes (Signed)
I connected with  Tammy Andrews on 12/18/18 at  2:15 PM EDT by telephone and verified that I am speaking with the correct person using two identifiers.   I discussed the limitations, risks, security and privacy concerns of performing an evaluation and management service by telephone and the availability of in person appointments. I also discussed with the patient that there may be a patient responsible charge related to this service. The patient expressed understanding and agreed to proceed.  [name], RN 12/18/2018  2:09 PM  Pt has BP cuff and is active in babyscripts.

## 2018-12-27 ENCOUNTER — Encounter (HOSPITAL_COMMUNITY): Payer: Self-pay

## 2018-12-31 NOTE — Telephone Encounter (Signed)
Opened in error

## 2019-01-01 ENCOUNTER — Other Ambulatory Visit (HOSPITAL_COMMUNITY): Payer: Self-pay | Admitting: *Deleted

## 2019-01-01 ENCOUNTER — Ambulatory Visit (HOSPITAL_COMMUNITY): Payer: Managed Care, Other (non HMO) | Admitting: *Deleted

## 2019-01-01 ENCOUNTER — Encounter (HOSPITAL_COMMUNITY): Payer: Self-pay

## 2019-01-01 ENCOUNTER — Ambulatory Visit (HOSPITAL_COMMUNITY)
Admission: RE | Admit: 2019-01-01 | Discharge: 2019-01-01 | Disposition: A | Discharge status: Home / Self Care | Payer: Managed Care, Other (non HMO) | Source: Ambulatory Visit | Attending: Obstetrics and Gynecology | Admitting: Obstetrics and Gynecology

## 2019-01-01 ENCOUNTER — Other Ambulatory Visit: Payer: Self-pay

## 2019-01-01 VITALS — BP 99/65 | HR 76 | Temp 98.1°F

## 2019-01-01 DIAGNOSIS — O10919 Unspecified pre-existing hypertension complicating pregnancy, unspecified trimester: Secondary | ICD-10-CM | POA: Insufficient documentation

## 2019-01-01 DIAGNOSIS — Z362 Encounter for other antenatal screening follow-up: Secondary | ICD-10-CM

## 2019-01-01 DIAGNOSIS — O365930 Maternal care for other known or suspected poor fetal growth, third trimester, not applicable or unspecified: Secondary | ICD-10-CM

## 2019-01-01 DIAGNOSIS — O4593 Premature separation of placenta, unspecified, third trimester: Secondary | ICD-10-CM | POA: Diagnosis not present

## 2019-01-01 DIAGNOSIS — O365990 Maternal care for other known or suspected poor fetal growth, unspecified trimester, not applicable or unspecified: Secondary | ICD-10-CM

## 2019-01-01 DIAGNOSIS — O0990 Supervision of high risk pregnancy, unspecified, unspecified trimester: Secondary | ICD-10-CM

## 2019-01-01 DIAGNOSIS — O99323 Drug use complicating pregnancy, third trimester: Secondary | ICD-10-CM

## 2019-01-01 DIAGNOSIS — O09293 Supervision of pregnancy with other poor reproductive or obstetric history, third trimester: Secondary | ICD-10-CM | POA: Diagnosis not present

## 2019-01-01 DIAGNOSIS — O99213 Obesity complicating pregnancy, third trimester: Secondary | ICD-10-CM

## 2019-01-01 DIAGNOSIS — O10013 Pre-existing essential hypertension complicating pregnancy, third trimester: Secondary | ICD-10-CM

## 2019-01-01 DIAGNOSIS — O99212 Obesity complicating pregnancy, second trimester: Secondary | ICD-10-CM

## 2019-01-01 DIAGNOSIS — Z3A34 34 weeks gestation of pregnancy: Secondary | ICD-10-CM

## 2019-01-09 ENCOUNTER — Ambulatory Visit (HOSPITAL_COMMUNITY): Payer: Managed Care, Other (non HMO) | Admitting: *Deleted

## 2019-01-09 ENCOUNTER — Other Ambulatory Visit: Payer: Self-pay

## 2019-01-09 ENCOUNTER — Ambulatory Visit (HOSPITAL_COMMUNITY)
Admission: RE | Admit: 2019-01-09 | Discharge: 2019-01-09 | Disposition: A | Discharge status: Home / Self Care | Payer: Managed Care, Other (non HMO) | Source: Ambulatory Visit | Attending: Obstetrics and Gynecology | Admitting: Obstetrics and Gynecology

## 2019-01-09 ENCOUNTER — Encounter (HOSPITAL_COMMUNITY): Payer: Self-pay | Admitting: *Deleted

## 2019-01-09 ENCOUNTER — Other Ambulatory Visit (HOSPITAL_COMMUNITY): Payer: Managed Care, Other (non HMO)

## 2019-01-09 DIAGNOSIS — O10013 Pre-existing essential hypertension complicating pregnancy, third trimester: Secondary | ICD-10-CM | POA: Diagnosis not present

## 2019-01-09 DIAGNOSIS — O0990 Supervision of high risk pregnancy, unspecified, unspecified trimester: Secondary | ICD-10-CM

## 2019-01-09 DIAGNOSIS — O364XX0 Maternal care for intrauterine death, not applicable or unspecified: Secondary | ICD-10-CM | POA: Insufficient documentation

## 2019-01-09 DIAGNOSIS — M549 Dorsalgia, unspecified: Secondary | ICD-10-CM

## 2019-01-09 DIAGNOSIS — O365930 Maternal care for other known or suspected poor fetal growth, third trimester, not applicable or unspecified: Secondary | ICD-10-CM | POA: Insufficient documentation

## 2019-01-09 DIAGNOSIS — O99612 Diseases of the digestive system complicating pregnancy, second trimester: Secondary | ICD-10-CM

## 2019-01-09 DIAGNOSIS — O09293 Supervision of pregnancy with other poor reproductive or obstetric history, third trimester: Secondary | ICD-10-CM | POA: Diagnosis not present

## 2019-01-09 DIAGNOSIS — O99323 Drug use complicating pregnancy, third trimester: Secondary | ICD-10-CM | POA: Diagnosis not present

## 2019-01-09 DIAGNOSIS — O9989 Other specified diseases and conditions complicating pregnancy, childbirth and the puerperium: Secondary | ICD-10-CM | POA: Insufficient documentation

## 2019-01-09 DIAGNOSIS — K5900 Constipation, unspecified: Secondary | ICD-10-CM | POA: Diagnosis present

## 2019-01-09 DIAGNOSIS — Z3A35 35 weeks gestation of pregnancy: Secondary | ICD-10-CM

## 2019-01-09 DIAGNOSIS — O99213 Obesity complicating pregnancy, third trimester: Secondary | ICD-10-CM

## 2019-01-14 ENCOUNTER — Telehealth: Payer: Self-pay | Admitting: Obstetrics and Gynecology

## 2019-01-14 ENCOUNTER — Other Ambulatory Visit: Payer: Self-pay

## 2019-01-14 ENCOUNTER — Encounter (HOSPITAL_COMMUNITY): Payer: Self-pay

## 2019-01-14 ENCOUNTER — Inpatient Hospital Stay (HOSPITAL_COMMUNITY)
Admission: AD | Admit: 2019-01-14 | Discharge: 2019-01-17 | DRG: 805 | Disposition: A | Discharge status: Home / Self Care | Payer: Managed Care, Other (non HMO) | Attending: Family Medicine | Admitting: Family Medicine

## 2019-01-14 DIAGNOSIS — Z3A36 36 weeks gestation of pregnancy: Secondary | ICD-10-CM | POA: Diagnosis not present

## 2019-01-14 DIAGNOSIS — Z87891 Personal history of nicotine dependence: Secondary | ICD-10-CM

## 2019-01-14 DIAGNOSIS — O364XX0 Maternal care for intrauterine death, not applicable or unspecified: Secondary | ICD-10-CM | POA: Diagnosis present

## 2019-01-14 DIAGNOSIS — B9689 Other specified bacterial agents as the cause of diseases classified elsewhere: Secondary | ICD-10-CM | POA: Diagnosis present

## 2019-01-14 DIAGNOSIS — Z9884 Bariatric surgery status: Secondary | ICD-10-CM

## 2019-01-14 DIAGNOSIS — O211 Hyperemesis gravidarum with metabolic disturbance: Secondary | ICD-10-CM | POA: Diagnosis present

## 2019-01-14 DIAGNOSIS — Z1159 Encounter for screening for other viral diseases: Secondary | ICD-10-CM

## 2019-01-14 DIAGNOSIS — O99844 Bariatric surgery status complicating childbirth: Secondary | ICD-10-CM | POA: Diagnosis present

## 2019-01-14 DIAGNOSIS — N760 Acute vaginitis: Secondary | ICD-10-CM

## 2019-01-14 DIAGNOSIS — F1210 Cannabis abuse, uncomplicated: Secondary | ICD-10-CM | POA: Diagnosis present

## 2019-01-14 DIAGNOSIS — O9902 Anemia complicating childbirth: Secondary | ICD-10-CM | POA: Diagnosis present

## 2019-01-14 DIAGNOSIS — O1002 Pre-existing essential hypertension complicating childbirth: Principal | ICD-10-CM | POA: Diagnosis present

## 2019-01-14 DIAGNOSIS — O99324 Drug use complicating childbirth: Secondary | ICD-10-CM | POA: Diagnosis present

## 2019-01-14 DIAGNOSIS — O365990 Maternal care for other known or suspected poor fetal growth, unspecified trimester, not applicable or unspecified: Secondary | ICD-10-CM | POA: Diagnosis present

## 2019-01-14 DIAGNOSIS — D649 Anemia, unspecified: Secondary | ICD-10-CM | POA: Diagnosis present

## 2019-01-14 DIAGNOSIS — O418X20 Other specified disorders of amniotic fluid and membranes, second trimester, not applicable or unspecified: Secondary | ICD-10-CM | POA: Diagnosis present

## 2019-01-14 DIAGNOSIS — O4690 Antepartum hemorrhage, unspecified, unspecified trimester: Secondary | ICD-10-CM | POA: Diagnosis present

## 2019-01-14 DIAGNOSIS — O10919 Unspecified pre-existing hypertension complicating pregnancy, unspecified trimester: Secondary | ICD-10-CM | POA: Diagnosis present

## 2019-01-14 DIAGNOSIS — O99019 Anemia complicating pregnancy, unspecified trimester: Secondary | ICD-10-CM | POA: Diagnosis present

## 2019-01-14 HISTORY — DX: Maternal care for other known or suspected poor fetal growth, unspecified trimester, not applicable or unspecified: O36.5990

## 2019-01-14 LAB — CBC
HCT: 32.7 % — ABNORMAL LOW (ref 36.0–46.0)
Hemoglobin: 11.1 g/dL — ABNORMAL LOW (ref 12.0–15.0)
MCH: 32.6 pg (ref 26.0–34.0)
MCHC: 33.9 g/dL (ref 30.0–36.0)
MCV: 95.9 fL (ref 80.0–100.0)
Platelets: 233 10*3/uL (ref 150–400)
RBC: 3.41 MIL/uL — ABNORMAL LOW (ref 3.87–5.11)
RDW: 12.9 % (ref 11.5–15.5)
WBC: 20.1 10*3/uL — ABNORMAL HIGH (ref 4.0–10.5)
nRBC: 0 % (ref 0.0–0.2)

## 2019-01-14 LAB — URINALYSIS, ROUTINE W REFLEX MICROSCOPIC
Glucose, UA: NEGATIVE mg/dL
Ketones, ur: 5 mg/dL — AB
Nitrite: NEGATIVE
Protein, ur: 100 mg/dL — AB
Specific Gravity, Urine: 1.033 — ABNORMAL HIGH (ref 1.005–1.030)
pH: 5 (ref 5.0–8.0)

## 2019-01-14 LAB — WET PREP, GENITAL
Sperm: NONE SEEN
Trich, Wet Prep: NONE SEEN
Yeast Wet Prep HPF POC: NONE SEEN

## 2019-01-14 LAB — TYPE AND SCREEN
ABO/RH(D): O POS
Antibody Screen: NEGATIVE

## 2019-01-14 LAB — AMNISURE RUPTURE OF MEMBRANE (ROM) NOT AT ARMC: Amnisure ROM: NEGATIVE

## 2019-01-14 MED ORDER — EPHEDRINE 5 MG/ML INJ: 10.0000 mg | INTRAVENOUS | Status: DC | PRN

## 2019-01-14 MED ORDER — LACTATED RINGERS IV SOLN
INTRAVENOUS | Status: DC
  Administered 2019-01-14: 23:00:00 via INTRAVENOUS

## 2019-01-14 MED ORDER — ACETAMINOPHEN 500 MG PO TABS
1000.0000 mg | ORAL_TABLET | Freq: Once | ORAL | Status: AC
  Administered 2019-01-14: 1000 mg via ORAL
  Filled 2019-01-14: qty 2

## 2019-01-14 MED ORDER — FLEET ENEMA 7-19 GM/118ML RE ENEM: 1.0000 | ENEMA | RECTAL | Status: DC | PRN

## 2019-01-14 MED ORDER — LACTATED RINGERS IV SOLN: 500.0000 mL | Freq: Once | INTRAVENOUS | Status: DC

## 2019-01-14 MED ORDER — LACTATED RINGERS IV SOLN: 500.0000 mL | INTRAVENOUS | Status: DC | PRN

## 2019-01-14 MED ORDER — PHENYLEPHRINE 40 MCG/ML (10ML) SYRINGE FOR IV PUSH (FOR BLOOD PRESSURE SUPPORT): 80.0000 ug | PREFILLED_SYRINGE | INTRAVENOUS | Status: DC | PRN

## 2019-01-14 MED ORDER — FENTANYL CITRATE (PF) 100 MCG/2ML IJ SOLN
100.0000 ug | INTRAMUSCULAR | Status: DC | PRN
  Filled 2019-01-14: qty 2

## 2019-01-14 MED ORDER — SODIUM CHLORIDE 0.9 % IV SOLN
5.0000 10*6.[IU] | Freq: Once | INTRAVENOUS | Status: AC
  Administered 2019-01-14: 5 10*6.[IU] via INTRAVENOUS
  Filled 2019-01-14: qty 5

## 2019-01-14 MED ORDER — FENTANYL CITRATE (PF) 100 MCG/2ML IJ SOLN
100.0000 ug | Freq: Once | INTRAMUSCULAR | Status: AC
  Administered 2019-01-14: 100 ug via INTRAVENOUS
  Filled 2019-01-14: qty 2

## 2019-01-14 MED ORDER — BETAMETHASONE SOD PHOS & ACET 6 (3-3) MG/ML IJ SUSP
12.0000 mg | INTRAMUSCULAR | Status: DC
  Administered 2019-01-14: 12 mg via INTRAMUSCULAR
  Filled 2019-01-14 (×2): qty 2

## 2019-01-14 MED ORDER — OXYTOCIN BOLUS FROM INFUSION
500.0000 mL | Freq: Once | INTRAVENOUS | Status: AC
  Administered 2019-01-15: 500 mL via INTRAVENOUS

## 2019-01-14 MED ORDER — SOD CITRATE-CITRIC ACID 500-334 MG/5ML PO SOLN: 30.0000 mL | ORAL | Status: DC | PRN

## 2019-01-14 MED ORDER — LIDOCAINE HCL (PF) 1 % IJ SOLN
30.0000 mL | INTRAMUSCULAR | Status: AC | PRN
  Administered 2019-01-15: 30 mL via SUBCUTANEOUS

## 2019-01-14 MED ORDER — DIPHENHYDRAMINE HCL 50 MG/ML IJ SOLN: 12.5000 mg | INTRAMUSCULAR | Status: DC | PRN

## 2019-01-14 MED ORDER — PENICILLIN G 3 MILLION UNITS IVPB - SIMPLE MED
3.0000 10*6.[IU] | INTRAVENOUS | Status: DC
  Administered 2019-01-15 (×2): 3 10*6.[IU] via INTRAVENOUS
  Filled 2019-01-14 (×2): qty 100

## 2019-01-14 MED ORDER — LACTATED RINGERS IV SOLN
INTRAVENOUS | Status: DC
  Administered 2019-01-15 (×3): via INTRAVENOUS

## 2019-01-14 MED ORDER — OXYTOCIN 40 UNITS IN NORMAL SALINE INFUSION - SIMPLE MED
2.5000 [IU]/h | INTRAVENOUS | Status: DC
  Filled 2019-01-14: qty 1000

## 2019-01-14 MED ORDER — ONDANSETRON HCL 4 MG/2ML IJ SOLN: 4.0000 mg | Freq: Four times a day (QID) | INTRAMUSCULAR | Status: DC | PRN

## 2019-01-14 MED ORDER — FENTANYL-BUPIVACAINE-NACL 0.5-0.125-0.9 MG/250ML-% EP SOLN
12.0000 mL/h | EPIDURAL | Status: DC | PRN
  Filled 2019-01-14: qty 250

## 2019-01-14 NOTE — MAU Note (Signed)
Tammy Andrews is a 28 y.o. at [redacted]w[redacted]d here in MAU reporting: losing her mucous plug earlier today with small amount of leakage of fluid.    Pain score: 8 Vitals:   01/14/19 1838 01/14/19 1839  BP: 120/66   Pulse: 79   Resp: 16   Temp: 98.1 F (36.7 C)   SpO2:  100%     FHT:130 Lab orders placed from triage:

## 2019-01-14 NOTE — H&P (Addendum)
OBSTETRIC ADMISSION HISTORY AND PHYSICAL  Tammy BareChynna Andrews is a 28 y.o. female G3P0110 with IUP at 374w4d by L/7 presenting for contractions and found to be in preterm labor.   Reports fetal movement. Denies leakage of fluids. Reports vaginal bleeding but none present on CNM exam.  [facility] in labor: FOB  Ultrasounds . 7w1: viability U/S, large subchorionic hematoma . 7w3: moderate to large subchorionic hemorrhage . 12w2: anterior placenta, large subchorionic hemorrhage present (12.4x5.3x11.6) . 19w0: EFW 44%, no evidence of fetal anomalies, hematoma over internal os with placental edge 1.4cm from os (low-lying placenta) . 27w0: EFW 39%, no evidence of retroplacental hemorrhage, resolved low-lying placenta, limited cardiac views because of fetal position . 34w5: EFW 3%, AC<3%, normal UAD  Prenatal History/Complications: Marland Kitchen. MJ Use, Hyperemesis  . Subchorionic Hemorrhage . Resolved Low-Lying Placenta . Chronic HTN . Aneima - s/p feraheme . History of fetal demise . History of bariatric surgery  Past Medical History: Past Medical History:  Diagnosis Date  . Anemia   . Anxiety   . Asthma    childhood  . Depression   . Hypertension   . Preterm labor     Past Surgical History: Past Surgical History:  Procedure Laterality Date  . DILATION AND CURETTAGE OF UTERUS    . LAPAROSCOPIC GASTRIC SLEEVE RESECTION  08/2014    Obstetrical History: OB History    Gravida  3   Para  1   Term  0   Preterm  1   AB  1   Living  0     SAB  0   TAB  1   Ectopic  0   Multiple  0   Live Births  0        Obstetric Comments  G2: per pt, approx 24wks, woke with bleeding (no pain) and dx with IUFD when went to hospital. IOL SVD        Social History: Social History   Socioeconomic History  . Marital status: Single    Spouse name: Not on file  . Number of children: Not on file  . Years of education: Not on file  .  Highest education level: Not on file  Occupational History  . Not on file  Social Needs  . Financial resource strain: Not on file  . Food insecurity    Worry: Sometimes true    Inability: Sometimes true  . Transportation needs    Medical: No    Non-medical: No  Tobacco Use  . Smoking status: Former Smoker    Quit date: 2018    Years since quitting: 2.4  . Smokeless tobacco: Never Used  Substance and Sexual Activity  . Alcohol use: Not Currently    Comment: not in pregnancy  . Drug use: Not Currently    Types: Marijuana    Comment: last used 11/26/18  . Sexual activity: Not Currently    Birth control/protection: None  Lifestyle  . Physical activity    Days per week: Not on file    Minutes per session: Not on file  . Stress: Not on file  Relationships  . Social Musicianconnections    Talks on phone: Not on file    Gets together: Not on file    Attends religious service: Not on file    Active member of club or organization: Not on file    Attends meetings of clubs or organizations: Not on file    Relationship status: Not  on file  Other Topics Concern  . Not on file  Social History Narrative   [place] husband and step-son; no psychiatrist    Family History: Family History  Problem Relation Age of Onset  . Hypertension Father   . Diabetes Paternal Grandmother     Allergies: Allergies  Allergen Reactions  . Pork-Derived Products     Medications Prior to Admission  Medication Sig Dispense Refill Last Dose  . aspirin 81 MG tablet Take 1 tablet (81 mg total) by mouth daily. 30 tablet 12   . feeding supplement, ENSURE ENLIVE, (ENSURE ENLIVE) LIQD Take 237 mLs by mouth 4 (four) times daily. 56 Bottle 3   . pantoprazole (PROTONIX) 40 MG tablet Take 1 tablet (40 mg total) by mouth daily. 60 tablet 1   . Prenatal Vit-Fe Fumarate-FA (PRENATAL MULTIVITAMIN) TABS tablet Take 1 tablet by mouth daily at 12 noon. 30 tablet 1   . promethazine (PHENERGAN) 12.5 MG  tablet Take 1 tablet (12.5 mg total) by mouth every 6 (six) hours as needed for nausea or vomiting. 30 tablet 0      Review of Systems  All systems reviewed and negative except as stated in HPI  Blood pressure 121/69, pulse 78, temperature 98.1 F (36.7 C), resp. rate 16, height 5\' 6"  (1.676 m), weight 97.1 kg, last menstrual period 04/19/2018, SpO2 100 %, unknown if currently breastfeeding. General appearance: alert, comfortable appearing Lungs: no respiratory distress Heart: regular rate  Abdomen: soft, non-tender; gravid  Pelvic: deferred Extremities: no LE edema  Presentation: cephalic by sutures on CNM exam Fetal monitoring: 110s/mod/+a/-d Uterine activity: irregular, every 4-8 minutes Dilation: 5 Effacement (%): 90 Exam by:: Lanice ShirtsV Rogers CNM  Prenatal labs: ABO, Rh: --/--/PENDING (06/29 2231) Antibody: PENDING (06/29 2231) Rubella: 17.90 (01/17 1206) RPR: Non Reactive (05/19 1318)  HBsAg: Negative (01/17 1206)  HIV: Non Reactive (05/19 1318)  GBS:   unknown Glucola: normal 1-hr Genetic screening:  Negative quad screen  Prenatal Transfer Tool  Maternal Diabetes: No Genetic Screening: Normal Maternal Ultrasounds/Referrals: Normal - aside from subchorionic hemorrhage, see details above Fetal Ultrasounds or other Referrals:  None Maternal Substance Abuse:  MJ use Significant Maternal Medications:  None Significant Maternal Lab Results: None  Results for orders placed or performed during the hospital encounter of 01/14/19 (from the past 24 hour(s))  Urinalysis, Routine w reflex microscopic   Collection Time: 01/14/19  6:47 PM  Result Value Ref Range   Color, Urine AMBER (A) YELLOW   APPearance HAZY (A) CLEAR   Specific Gravity, Urine 1.033 (H) 1.005 - 1.030   pH 5.0 5.0 - 8.0   Glucose, UA NEGATIVE NEGATIVE mg/dL   Hgb urine dipstick SMALL (A) NEGATIVE   Bilirubin Urine MODERATE (A) NEGATIVE   Ketones, ur 5 (A) NEGATIVE mg/dL   Protein, ur 161100 (A) NEGATIVE mg/dL    Nitrite NEGATIVE NEGATIVE   Leukocytes,Ua MODERATE (A) NEGATIVE   RBC / HPF 0-5 0 - 5 RBC/hpf   WBC, UA 11-20 0 - 5 WBC/hpf   Bacteria, UA FEW (A) NONE SEEN   Squamous Epithelial / LPF 0-5 0 - 5   Mucus PRESENT   Wet prep, genital   Collection Time: 01/14/19  8:40 PM  Result Value Ref Range   Yeast Wet Prep HPF POC NONE SEEN NONE SEEN   Trich, Wet Prep NONE SEEN NONE SEEN   Clue Cells Wet Prep HPF POC PRESENT (A) NONE SEEN   WBC, Wet Prep HPF POC MANY (  A) NONE SEEN   Sperm NONE SEEN   Amnisure rupture of membrane (rom)not at [facility]   Collection Time: 01/14/19  8:40 PM  Result Value Ref Range   Amnisure ROM NEGATIVE   CBC   Collection Time: 01/14/19 10:28 PM  Result Value Ref Range   WBC 20.1 (H) 4.0 - 10.5 K/uL   RBC 3.41 (L) 3.87 - 5.11 MIL/uL   Hemoglobin 11.1 (L) 12.0 - 15.0 g/dL   HCT 32.7 (L) 36.0 - 46.0 %   MCV 95.9 80.0 - 100.0 fL   MCH 32.6 26.0 - 34.0 pg   MCHC 33.9 30.0 - 36.0 g/dL   RDW 12.9 11.5 - 15.5 %   Platelets 233 150 - 400 K/uL   nRBC 0.0 0.0 - 0.2 %  Type and screen [place]   Collection Time: 01/14/19 10:31 PM  Result Value Ref Range   ABO/RH(D) PENDING    Antibody Screen PENDING    Sample Expiration      01/17/2019,2359 Performed at [facility], [place] [address] [place], [place] [address]     Patient Active Problem List   Diagnosis Date Noted  . Preterm labor 01/14/2019  . Fetal demise in singleton pregnancy greater than [redacted] weeks gestation, antepartum 09/13/2018  . Elevated amylase 08/27/2018  . Marijuana abuse 08/09/2018  . H/O bariatric surgery 08/09/2018  . Anemia in pregnancy 08/09/2018  . Vaginal bleeding during pregnancy 08/09/2018  . Constipation during pregnancy 08/06/2018  . Back pain affecting pregnancy 08/06/2018  . Supervision of high risk pregnancy, antepartum 08/03/2018  . Subchorionic hemorrhage in second trimester 08/02/2018  . Hyperemesis gravidarum before end of [redacted] week gestation with dehydration  07/27/2018  . Chronic hypertension during pregnancy, antepartum 11/03/2017    Assessment/Plan:  Tammy Andrews is a 28 y.o. G3P0110 at [redacted]w[redacted]d here for preterm labor.  Labor: Preterm labor, intact. Will expectantly manage, anticipate SVD.  -- pain control: open to options, can have epidural   CHTN: Asymptomatic. BP normal to moderate range. -- PIH labs, UPC pending   Fetal Wellbeing: EFW <3% at 34w5. Cephalic by sutures on CNM exam.  -- GBS (uknown) - will give PCN, culture pending -- continuous fetal monitoring - category I   Postpartum Planning -- breast/IUD -- RI/[x] Tdap/[x] flu   Tammy Andrews S. Juleen China, DO OB/GYN Fellow

## 2019-01-14 NOTE — MAU Provider Note (Addendum)
First Provider Initiated Contact with Patient 01/14/19 2020     S: [name] is a 28 y.o. G3P0110 at [redacted]w[redacted]d  who presents to MAU today complaining of leaking of fluid since 1300 or 1400. She denies vaginal bleeding other than some bloody show.  She endorses contractions that started at the same time. She reports normal fetal movement.    She has an appt at [facility] tomorrow (01-15-2019).  She is a chronic hypertensive not on medication; she has a history of IUFD at 24 weeks.   O: BP 121/69   Pulse 78   Temp 98.1 F (36.7 C)   Resp 16   Ht 5\' 6"  (1.676 m)   Wt 97.1 kg   LMP 04/19/2018 (Approximate)   SpO2 100%   BMI 34.54 kg/m  GENERAL: Well-developed, well-nourished female in no acute distress.  HEAD: Normocephalic, atraumatic.  CHEST: Normal effort of breathing, regular heart rate ABDOMEN: Soft, nontender, gravid PELVIC: Normal external female genitalia. Vagina is pink and rugated. Cervix with normal contour, no lesions. Normal discharge.  Small amount of watery, blood tinged discharge in the vagina; frothy with no odor.  Cervical exam: 3 cm, posterior, 50% effaced.    Amnisure negative  Fetal Monitoring: Baseline: 135 Variability: Mod var Accelerations: present acels Decelerations: occasional variables Contractions: irregular  Results for orders placed or performed during the hospital encounter of 01/14/19 (from the past 24 hour(s))  Urinalysis, Routine w reflex microscopic     Status: Abnormal   Collection Time: 01/14/19  6:47 PM  Result Value Ref Range   Color, Urine AMBER (A) YELLOW   APPearance HAZY (A) CLEAR   Specific Gravity, Urine 1.033 (H) 1.005 - 1.030   pH 5.0 5.0 - 8.0   Glucose, UA NEGATIVE NEGATIVE mg/dL   Hgb urine dipstick SMALL (A) NEGATIVE   Bilirubin Urine MODERATE (A) NEGATIVE   Ketones, ur 5 (A) NEGATIVE mg/dL   Protein, ur 100 (A) NEGATIVE mg/dL   Nitrite NEGATIVE NEGATIVE   Leukocytes,Ua MODERATE (A) NEGATIVE   RBC / HPF 0-5 0 - 5  RBC/hpf   WBC, UA 11-20 0 - 5 WBC/hpf   Bacteria, UA FEW (A) NONE SEEN   Squamous Epithelial / LPF 0-5 0 - 5   Mucus PRESENT   Wet prep, genital     Status: Abnormal   Collection Time: 01/14/19  8:40 PM  Result Value Ref Range   Yeast Wet Prep HPF POC NONE SEEN NONE SEEN   Trich, Wet Prep NONE SEEN NONE SEEN   Clue Cells Wet Prep HPF POC PRESENT (A) NONE SEEN   WBC, Wet Prep HPF POC MANY (A) NONE SEEN   Sperm NONE SEEN   Amnisure rupture of membrane (rom)not at [facility]     Status: None   Collection Time: 01/14/19  8:40 PM  Result Value Ref Range   Amnisure ROM NEGATIVE    A: SIUP at [redacted]w[redacted]d  Membranes intact Wet prep positive for clue, will treat for BV Cervix was 2.5-3 cm/posterior/thick at 2030, now at 2145 cervix is 3.5/anterior, 60% with a BBOW.  P: Will continue to monitor for labor and cervical change.  -Discussed with Dr. Harolyn Rutherford, will start IV, draw labs and given fentanyl as labor comfort; patient is on hands and knees in bed and unable to trace FHR; will continue to reposition and attempt tracing.   -Report given to Darrol Poke CNM at [address], [place] 01/14/2019 10:00 PM  Cervix checked and noted continued  cervix change, 5cm/90/BBOW  Labor team notified and aware  Admit to L&D   Sharyon CableVeronica C Chelcea Zahn, CNM 01/14/19, 11:13 PM

## 2019-01-14 NOTE — Telephone Encounter (Signed)
Patient was called and instructed about her visit for 01/15/2019. Patient was screened for COVID-19 symptoms and denied any symptoms. Instructed to use the provided hand sanitizer when entering building. Patient was instructed about wearing a mask for their entire visit, and no visitors.  °

## 2019-01-15 ENCOUNTER — Encounter: Payer: Managed Care, Other (non HMO) | Admitting: Obstetrics and Gynecology

## 2019-01-15 ENCOUNTER — Inpatient Hospital Stay (HOSPITAL_COMMUNITY): Payer: Managed Care, Other (non HMO) | Admitting: Anesthesiology

## 2019-01-15 ENCOUNTER — Encounter: Payer: Managed Care, Other (non HMO) | Admitting: Medical

## 2019-01-15 ENCOUNTER — Encounter (HOSPITAL_COMMUNITY): Payer: Self-pay | Admitting: Obstetrics and Gynecology

## 2019-01-15 DIAGNOSIS — Z3A36 36 weeks gestation of pregnancy: Secondary | ICD-10-CM

## 2019-01-15 DIAGNOSIS — O1002 Pre-existing essential hypertension complicating childbirth: Secondary | ICD-10-CM

## 2019-01-15 LAB — COMPREHENSIVE METABOLIC PANEL
ALT: 10 U/L (ref 0–44)
AST: 17 U/L (ref 15–41)
Albumin: 3.1 g/dL — ABNORMAL LOW (ref 3.5–5.0)
Alkaline Phosphatase: 97 U/L (ref 38–126)
Anion gap: 10 (ref 5–15)
BUN: 9 mg/dL (ref 6–20)
CO2: 20 mmol/L — ABNORMAL LOW (ref 22–32)
Calcium: 9.1 mg/dL (ref 8.9–10.3)
Chloride: 103 mmol/L (ref 98–111)
Creatinine, Ser: 0.87 mg/dL (ref 0.44–1.00)
GFR calc Af Amer: >60 mL/min (ref >60)
GFR calc non Af Amer: >60 mL/min (ref >60)
Glucose, Bld: 71 mg/dL (ref 70–99)
Potassium: 3.7 mmol/L (ref 3.5–5.1)
Sodium: 133 mmol/L — ABNORMAL LOW (ref 135–145)
Total Bilirubin: 0.4 mg/dL (ref 0.3–1.2)
Total Protein: 6.8 g/dL (ref 6.5–8.1)

## 2019-01-15 LAB — RAPID URINE DRUG SCREEN, HOSP PERFORMED
Amphetamines: NOT DETECTED
Barbiturates: NOT DETECTED
Benzodiazepines: NOT DETECTED
Cocaine: NOT DETECTED
Opiates: NOT DETECTED
Tetrahydrocannabinol: POSITIVE — AB

## 2019-01-15 LAB — ABO/RH: ABO/RH(D): O POS

## 2019-01-15 LAB — GC/CHLAMYDIA PROBE AMP (~~LOC~~) NOT AT ARMC
Chlamydia: NEGATIVE
Neisseria Gonorrhea: NEGATIVE

## 2019-01-15 LAB — SARS CORONAVIRUS 2 BY RT PCR (HOSPITAL ORDER, PERFORMED IN ~~LOC~~ HOSPITAL LAB): SARS Coronavirus 2: NEGATIVE

## 2019-01-15 LAB — PROTEIN / CREATININE RATIO, URINE
Creatinine, Urine: 453.98 mg/dL
Protein Creatinine Ratio: 0.24 mg/mg{Cre} — ABNORMAL HIGH (ref 0.00–0.15)
Total Protein, Urine: 110 mg/dL

## 2019-01-15 LAB — RPR: RPR Ser Ql: NONREACTIVE

## 2019-01-15 MED ORDER — METRONIDAZOLE 500 MG PO TABS
500.0000 mg | ORAL_TABLET | Freq: Two times a day (BID) | ORAL | Status: DC
  Administered 2019-01-15 – 2019-01-17 (×3): 500 mg via ORAL
  Filled 2019-01-15 (×5): qty 1

## 2019-01-15 MED ORDER — ACETAMINOPHEN 325 MG PO TABS
650.0000 mg | ORAL_TABLET | ORAL | Status: DC | PRN
  Administered 2019-01-15: 650 mg via ORAL
  Filled 2019-01-15: qty 2

## 2019-01-15 MED ORDER — WITCH HAZEL-GLYCERIN EX PADS: 1.0000 "application " | MEDICATED_PAD | CUTANEOUS | Status: DC | PRN

## 2019-01-15 MED ORDER — ONDANSETRON HCL 4 MG/2ML IJ SOLN: 4.0000 mg | INTRAMUSCULAR | Status: DC | PRN

## 2019-01-15 MED ORDER — ACETAMINOPHEN 500 MG PO TABS
1000.0000 mg | ORAL_TABLET | Freq: Once | ORAL | Status: AC
  Administered 2019-01-15: 05:00:00 1000 mg via ORAL
  Filled 2019-01-15: qty 2

## 2019-01-15 MED ORDER — PRENATAL MULTIVITAMIN CH
1.0000 | ORAL_TABLET | Freq: Every day | ORAL | Status: DC
  Administered 2019-01-16: 1 via ORAL
  Filled 2019-01-15: qty 1

## 2019-01-15 MED ORDER — SIMETHICONE 80 MG PO CHEW: 80.0000 mg | CHEWABLE_TABLET | ORAL | Status: DC | PRN

## 2019-01-15 MED ORDER — LIDOCAINE-EPINEPHRINE (PF) 2 %-1:200000 IJ SOLN
INTRAMUSCULAR | Status: DC | PRN
  Administered 2019-01-15: 5 mL via EPIDURAL

## 2019-01-15 MED ORDER — SODIUM CHLORIDE (PF) 0.9 % IJ SOLN
INTRAMUSCULAR | Status: DC | PRN
  Administered 2019-01-15: 12 mL/h via EPIDURAL

## 2019-01-15 MED ORDER — DIBUCAINE (PERIANAL) 1 % EX OINT: 1.0000 "application " | TOPICAL_OINTMENT | CUTANEOUS | Status: DC | PRN

## 2019-01-15 MED ORDER — IBUPROFEN 600 MG PO TABS
600.0000 mg | ORAL_TABLET | Freq: Four times a day (QID) | ORAL | Status: DC
  Administered 2019-01-15 – 2019-01-17 (×7): 600 mg via ORAL
  Filled 2019-01-15 (×7): qty 1

## 2019-01-15 MED ORDER — COCONUT OIL OIL: 1.0000 "application " | TOPICAL_OIL | Status: DC | PRN

## 2019-01-15 MED ORDER — BENZOCAINE-MENTHOL 20-0.5 % EX AERO
1.0000 "application " | INHALATION_SPRAY | CUTANEOUS | Status: DC | PRN
  Administered 2019-01-15: 1 via TOPICAL
  Filled 2019-01-15: qty 56

## 2019-01-15 MED ORDER — LIDOCAINE HCL (PF) 1 % IJ SOLN
INTRAMUSCULAR | Status: DC | PRN
  Administered 2019-01-15: 4 mL via EPIDURAL
  Administered 2019-01-15: 5 mL via EPIDURAL

## 2019-01-15 MED ORDER — BUPIVACAINE HCL (PF) 0.25 % IJ SOLN
INTRAMUSCULAR | Status: DC | PRN
  Administered 2019-01-15: 8 mL via EPIDURAL

## 2019-01-15 MED ORDER — DIPHENHYDRAMINE HCL 25 MG PO CAPS: 25.0000 mg | ORAL_CAPSULE | Freq: Four times a day (QID) | ORAL | Status: DC | PRN

## 2019-01-15 MED ORDER — CYCLOBENZAPRINE HCL 5 MG PO TABS
10.0000 mg | ORAL_TABLET | Freq: Once | ORAL | Status: AC | PRN
  Administered 2019-01-15: 10 mg via ORAL
  Filled 2019-01-15: qty 2

## 2019-01-15 MED ORDER — LACTATED RINGERS AMNIOINFUSION
INTRAVENOUS | Status: DC
  Administered 2019-01-15: 11:00:00 via INTRAUTERINE

## 2019-01-15 MED ORDER — TETANUS-DIPHTH-ACELL PERTUSSIS 5-2.5-18.5 LF-MCG/0.5 IM SUSP: 0.5000 mL | Freq: Once | INTRAMUSCULAR | Status: DC

## 2019-01-15 MED ORDER — SENNOSIDES-DOCUSATE SODIUM 8.6-50 MG PO TABS
2.0000 | ORAL_TABLET | ORAL | Status: DC
  Administered 2019-01-15 – 2019-01-16 (×2): 2 via ORAL
  Filled 2019-01-15 (×2): qty 2

## 2019-01-15 MED ORDER — FENTANYL-BUPIVACAINE-NACL 0.5-0.125-0.9 MG/250ML-% EP SOLN: 12.0000 mL/h | EPIDURAL | Status: DC | PRN

## 2019-01-15 MED ORDER — TERBUTALINE SULFATE 1 MG/ML IJ SOLN
0.2500 mg | Freq: Once | INTRAMUSCULAR | Status: AC
  Administered 2019-01-15: 0.25 mg via SUBCUTANEOUS

## 2019-01-15 MED ORDER — ZOLPIDEM TARTRATE 5 MG PO TABS
5.0000 mg | ORAL_TABLET | Freq: Every evening | ORAL | Status: DC | PRN
  Administered 2019-01-16: 5 mg via ORAL
  Filled 2019-01-15: qty 1

## 2019-01-15 MED ORDER — TERBUTALINE SULFATE 1 MG/ML IJ SOLN
INTRAMUSCULAR | Status: AC
  Administered 2019-01-15: 0.25 mg via SUBCUTANEOUS
  Filled 2019-01-15: qty 1

## 2019-01-15 MED ORDER — ONDANSETRON HCL 4 MG PO TABS
4.0000 mg | ORAL_TABLET | ORAL | Status: DC | PRN
  Administered 2019-01-15 – 2019-01-16 (×2): 4 mg via ORAL
  Filled 2019-01-15 (×2): qty 1

## 2019-01-15 MED ORDER — FENTANYL CITRATE (PF) 100 MCG/2ML IJ SOLN
INTRAMUSCULAR | Status: DC | PRN
  Administered 2019-01-15: 50 ug via EPIDURAL

## 2019-01-15 NOTE — Anesthesia Postprocedure Evaluation (Signed)
Anesthesia Post Note  Patient: Product manager  Procedure(s) Performed: AN AD [place]     Patient location during evaluation: Mother Baby Anesthesia Type: Epidural Level of consciousness: awake and alert Pain management: pain level controlled Vital Signs Assessment: post-procedure vital signs reviewed and stable Respiratory status: spontaneous breathing, nonlabored ventilation and respiratory function stable Cardiovascular status: stable Postop Assessment: no headache, no backache, epidural receding, no apparent nausea or vomiting, patient able to bend at knees, adequate PO intake and able to ambulate Anesthetic complications: no    Last Vitals:  Vitals:   01/15/19 1440 01/15/19 1540  BP: 136/87 127/84  Pulse: 100 96  Resp: 18 17  Temp: 36.8 C 37 C  SpO2:  100%    Last Pain:  Vitals:   01/15/19 1540  TempSrc: Oral  PainSc:    Pain Goal: Patients Stated Pain Goal: 3 (01/15/19 1440)                 Arion Shankles Hristova

## 2019-01-15 NOTE — Progress Notes (Signed)
OB/GYN Faculty Practice: Labor Progress Note  Subjective: Called regarding patient's back pain. Just received re-dose of epidural, still feeling significant left-sided back pain. Have tried to reposition on left side but FHR does not tolerate. Tried heating pad and Tylenol with minimal relief. Reports trouble with constipation recently but pain was more acute. Describes as starting in her mid back/flank and going towards the middle.   Objective: BP 131/82   Pulse 83   Temp 98.2 F (36.8 C) (Oral)   Resp 16   Ht 5\' 6"  (1.676 m)   Wt 97.1 kg   LMP 04/19/2018 (Approximate)   SpO2 100%   BMI 34.54 kg/m  Gen: tearful, uterus palpates softer in between contractions Dilation: 5 Effacement (%): 100 Cervical Position: Middle Station: (BBOW, unable to feel presenting part) Presentation: Vertex Exam by:: K Faucett RN  Assessment and Plan:Tammy Andrews is a 28 y.o. G3P0110 at [redacted]w[redacted]d here for preterm labor.  Labor: Preterm labor, intact. Will expectantly manage, anticipate SVD. Will trial flexeril and one dose of terbutaline to help with back pain - though low suspicion of aiding given not currently feeling contractions. Contractions currently palpate moderate per RN. Has struggled with back pain throughout pregnancy. Would prefer not to augment labor at this point especially since unchanged for last 6 hours. Has anterior placenta with resolved retroplacental hemorrhage, no evidence of vaginal bleeding, uterus relaxes in between contractions and reassuring FHR tracing so do not think that placental abruption is etiology of pain but will continue to monitor closely for signs.  -- pain control: epidural in place  -- PPH Risk: Medium (hx vaginal bleeding pregnancy, cHTN)  CHTN: Asymptomatic. BP normal to moderate range. [place] labs wnl. UPC still pending. -- continue to monitor closely   Fetal Wellbeing: EFW <3% at 34w5. Cephalic by sutures on CNM exam.  -- GBS (uknown) - will give PCN, culture  pending -- continuous fetal monitoring - category I - has had change in baseline from 120-130s to 110s and occasional deceleration into high 90s-100s, still with excellent variability and accelerations   Juanpablo Ciresi S. Juleen China, DO OB/GYN Fellow, Faculty Practice  5:12 AM

## 2019-01-15 NOTE — Anesthesia Preprocedure Evaluation (Signed)
Anesthesia Evaluation  Patient identified by MRN, date of birth, ID band Patient awake    Reviewed: Allergy & Precautions, NPO status , Patient's Chart, lab work & pertinent test results  History of Anesthesia Complications Negative for: history of anesthetic complications  Airway Mallampati: II   Neck ROM: Full    Dental   Pulmonary asthma (childhood) ,    Pulmonary exam normal        Cardiovascular hypertension, Normal cardiovascular exam     Neuro/Psych PSYCHIATRIC DISORDERS Anxiety Depression negative neurological ROS     GI/Hepatic (+)     substance abuse  marijuana use,  S/p gastric sleeve    Endo/Other   Obesity   Renal/GU negative Renal ROS     Musculoskeletal negative musculoskeletal ROS (+)   Abdominal   Peds  Hematology  (+) anemia ,   Anesthesia Other Findings   Reproductive/Obstetrics (+) Pregnancy                             Anesthesia Physical Anesthesia Plan  ASA: II  Anesthesia Plan: Epidural   Post-op Pain Management:    Induction:   PONV Risk Score and Plan: 2 and Treatment may vary due to age or medical condition  Airway Management Planned: Natural Airway  Additional Equipment: None  Intra-op Plan:   Post-operative Plan:   Informed Consent: I have reviewed the patients History and Physical, chart, labs and discussed the procedure including the risks, benefits and alternatives for the proposed anesthesia with the patient or authorized representative who has indicated his/her understanding and acceptance.       Plan Discussed with: Anesthesiologist  Anesthesia Plan Comments: (Labs reviewed. Platelets acceptable, patient not taking any blood thinning medications. Per RN, FHR tracing reported to be stable enough for sitting procedure. Risks and benefits discussed with patient, including PDPH, backache, epidural hematoma, failed epidural, allergic  reaction, and nerve injury. Patient expressed understanding and wished to proceed.)        Anesthesia Quick Evaluation

## 2019-01-15 NOTE — Plan of Care (Signed)
Pt resting comfortably in bed after epidural redose.  FHR reassuring but does not currently tolerate laying on left side (pt currently in R tilt). Second dose of PCN administered. Will continue to monitor and communicate with team as needed.   Problem: Education: Goal: Knowledge of General Education information will improve Description: Including pain rating scale, medication(s)/side effects and non-pharmacologic comfort measures Outcome: Progressing   Problem: Health Behavior/Discharge Planning: Goal: Ability to manage health-related needs will improve Outcome: Progressing   Problem: Clinical Measurements: Goal: Ability to maintain clinical measurements within normal limits will improve Outcome: Progressing Goal: Will remain free from infection Outcome: Progressing Goal: Diagnostic test results will improve Outcome: Progressing Goal: Respiratory complications will improve Outcome: Progressing Goal: Cardiovascular complication will be avoided Outcome: Progressing   Problem: Activity: Goal: Risk for activity intolerance will decrease Outcome: Progressing   Problem: Nutrition: Goal: Adequate nutrition will be maintained Outcome: Progressing   Problem: Coping: Goal: Level of anxiety will decrease Outcome: Progressing   Problem: Elimination: Goal: Will not experience complications related to bowel motility Outcome: Progressing Goal: Will not experience complications related to urinary retention Outcome: Progressing   Problem: Pain Managment: Goal: General experience of comfort will improve Outcome: Progressing   Problem: Safety: Goal: Ability to remain free from injury will improve Outcome: Progressing   Problem: Skin Integrity: Goal: Risk for impaired skin integrity will decrease Outcome: Progressing   Problem: Education: Goal: Knowledge of Childbirth will improve Outcome: Progressing Goal: Ability to make informed decisions regarding treatment and plan of care  will improve Outcome: Progressing Goal: Ability to state and carry out methods to decrease the pain will improve Outcome: Progressing Goal: Individualized Educational Video(s) Outcome: Progressing   Problem: Coping: Goal: Ability to verbalize concerns and feelings about labor and delivery will improve Outcome: Progressing   Problem: Life Cycle: Goal: Ability to make normal progression through stages of labor will improve Outcome: Progressing Goal: Ability to effectively push during vaginal delivery will improve Outcome: Progressing   Problem: Role Relationship: Goal: Will demonstrate positive interactions with the child Outcome: Progressing   Problem: Safety: Goal: Risk of complications during labor and delivery will decrease Outcome: Progressing   Problem: Pain Management: Goal: Relief or control of pain from uterine contractions will improve Outcome: Progressing

## 2019-01-15 NOTE — Anesthesia Procedure Notes (Signed)
Epidural Patient location during procedure: OB Start time: 01/15/2019 12:20 AM End time: 01/15/2019 12:24 AM  Staffing Anesthesiologist: Audry Pili, MD Performed: anesthesiologist   Preanesthetic Checklist Completed: patient identified, pre-op evaluation, timeout performed, IV checked, risks and benefits discussed and monitors and equipment checked  Epidural Patient position: sitting Prep: DuraPrep Patient monitoring: continuous pulse ox and blood pressure Approach: midline Location: L2-L3 Injection technique: LOR saline  Needle:  Needle type: Tuohy  Needle gauge: 17 G Needle length: 9 cm Needle insertion depth: 7 cm Catheter size: 19 Gauge Catheter at skin depth: 12 cm Test dose: negative and Other (1% lidocaine)  Assessment Events: blood not aspirated  Additional Notes Patient identified. Risks including, but not limited to, bleeding, infection, nerve damage, paralysis, inadequate analgesia, blood pressure changes, nausea, vomiting, allergic reaction, postpartum back pain, itching, and headache were discussed. Patient expressed understanding and wished to proceed. Sterile prep and drape, including hand hygiene, mask, and sterile gloves were used. The patient was positioned and the spine was prepped. The skin was anesthetized with lidocaine. No paraesthesia or other complication noted. The patient did not experience any signs of intravascular injection such as tinnitus or metallic taste in mouth, nor signs of intrathecal spread such as rapid motor block. Please see nursing notes for vital signs. The patient tolerated the procedure well.   Renold Don, MDReason for block:procedure for pain

## 2019-01-15 NOTE — Progress Notes (Signed)
OB/GYN Faculty Practice: Labor Progress Note  Subjective:  Introduced myself to patient. She is able to feel contractions lightly. Just work up from a nap.   Objective:  BP 131/70   Pulse 98   Temp 98 F (36.7 C) (Oral)   Resp 18   Ht 5\' 6"  (1.676 m)   Wt 97.1 kg   LMP 04/19/2018 (Approximate)   SpO2 98%   BMI 34.54 kg/m  Gen: Lying in bed comfortably. NAD.  Extremities: No signs of DVT.   CE: Dilation: 10 Dilation Complete Date: 01/15/19 Dilation Complete Time: 0944 Effacement (%): 100 Cervical Position: Middle Station: 0 Presentation: Vertex Exam by:: Dr. Maudie Mercury Contractions: regular q2-51minutes  FH: BL 120, mod var, + a, -d.   Assessment and Plan:  Tammy Andrews is a 28 y.o. G3P0110 at [redacted]w[redacted]d - PTL  Labor: s/p AROM with decel to 60 and then rebounded to previous baseline. No umbilical cord appreciated. Head came down nicely into pelvis and then complete. .  . Pain control: Epidural . Anticipated MOD: NSVD . PPH Risk: low  . S/p BMZ (6/29-)  BV: clue cells on wet prep  Start metronidazole after delivery  CHTN: BP: (104-149)/(65-100) 138/83 (06/30 1030), PIH wnl  Continue to monitor pressures and symptoms  Consider decreasing mIVF  UDS: + for THC   SW consult   Asymptomatic Bacteuria  . Follow up OB urine culture   Fetal Wellbeing: Cat I tracing . GBS unknown - PCN (culture reincubated for better growth)  . Continuous fetal monitoring  Zettie Cooley, M.D.  Family Medicine  PGY-1 01/15/2019 10:30 AM

## 2019-01-15 NOTE — Discharge Summary (Signed)
Postpartum Discharge Summary  Patient Name: Tammy Andrews DOB: [birth year] MRN: [id number]  Date of admission: 01/14/2019 Delivering Provider: Levie HeritageSTINSON, JACOB J   Date of discharge: 01/17/2019  Admitting diagnosis: 37wks, mucus plug came out Intrauterine pregnancy: 7123w5d     Secondary diagnosis:  Principal Problem:   Preterm labor Active Problems:   Chronic hypertension during pregnancy, antepartum   Hyperemesis gravidarum before end of [redacted] week gestation with dehydration   Subchorionic hemorrhage in second trimester   Marijuana abuse   H/O bariatric surgery   Anemia in pregnancy   Vaginal bleeding during pregnancy   Fetal demise in singleton pregnancy greater than [redacted] weeks gestation, antepartum   Pregnancy affected by fetal growth restriction     Discharge diagnosis: Preterm Pregnancy Delivered                                                                 Postpartum procedures:none Augmentation: AROM Complications: None  Hospital course:  Onset of Labor With Vaginal Delivery     28 y.o. yo G3P0110 at 7223w5d was admitted in Active Labor on 01/14/2019. Patient had an uncomplicated labor course as follows:  Membrane Rupture Time/Date: 9:44 AM ,01/15/2019   Intrapartum Procedures: Episiotomy: None [1]                                         Lacerations:  Labial [10]  Patient had a delivery of a Viable infant. 01/15/2019  Information for the patient's newborn:  Brennan BaileyCrockett, Girl Ardine [[id number]][[id number]]  Delivery Method: Vag-Spont    Patient had an uncomplicated postpartum course.  She is ambulating, tolerating a regular diet, passing flatus, and urinating well. Patient is discharged home in stable condition on 01/17/19.  Magnesium Sulfate recieved: No BMZ received: Yes  Physical exam  Vitals:   01/16/19 0337 01/16/19 0552 01/16/19 1453 01/17/19 0500  BP: 138/87 129/85 126/69 (!) 112/56  Pulse: 84 84 89 72  Resp: 15 14 16 16   Temp: 98.4 F (36.9 C) 97.9 F (36.6 C) 98.1 F (36.7 C)  98.7 F (37.1 C)  TempSrc: Oral Oral Oral Oral  SpO2: 100% 100%    Weight:      Height:       General: alert, sitting up in bed, NAD Lochia: appropriate Uterine Fundus: firm Incision: N/A DVT Evaluation: No significant calf/ankle edema  Labs: Lab Results  Component Value Date   WBC 28.5 (H) 01/16/2019   HGB 8.5 (L) 01/16/2019   HCT 24.3 (L) 01/16/2019   MCV 94.2 01/16/2019   PLT 187 01/16/2019   CMP Latest Ref Rng & Units 01/15/2019  Glucose 70 - 99 mg/dL 71  BUN 6 - 20 mg/dL 9  Creatinine 6.210.44 - 3.081.00 mg/dL 6.570.87  Sodium 846135 - 962145 mmol/L 133(L)  Potassium 3.5 - 5.1 mmol/L 3.7  Chloride 98 - 111 mmol/L 103  CO2 22 - 32 mmol/L 20(L)  Calcium 8.9 - 10.3 mg/dL 9.1  Total Protein 6.5 - 8.1 g/dL 6.8  Total Bilirubin 0.3 - 1.2 mg/dL 0.4  Alkaline Phos 38 - 126 U/L 97  AST 15 - 41 U/L 17  ALT 0 - 44 U/L 10    Discharge instruction:  per After Visit Summary and "Baby and Me Booklet".  After visit meds:  Allergies as of 01/17/2019      Reactions   Pork-derived Products       Medication List    STOP taking these medications   aspirin 81 MG tablet   feeding supplement (ENSURE ENLIVE) Liqd   promethazine 12.5 MG tablet Commonly known as: PHENERGAN     TAKE these medications   ibuprofen 600 MG tablet Commonly known as: ADVIL Take 1 tablet (600 mg total) by mouth 3 (three) times daily.   pantoprazole 40 MG tablet Commonly known as: PROTONIX Take 1 tablet (40 mg total) by mouth daily.   prenatal multivitamin Tabs tablet Take 1 tablet by mouth daily at 12 noon.   senna-docusate 8.6-50 MG tablet Commonly known as: Senokot-S Take 2 tablets by mouth at bedtime as needed for mild constipation.       Diet: routine diet Activity: Advance as tolerated. Pelvic rest for 6 weeks.   Outpatient follow up:4-6 weeks Follow up Appt: Future Appointments  Date Time Provider [place]  01/22/2019  9:00 AM [place]  02/12/2019  1:55 PM Glenice Bow, DO [place]   Follow up Visit: [place] [facility]. Schedule an appointment as soon as possible for a visit.   Specialty: Obstetrics and Gynecology Why: You should receive a call to schedule your postpartum follow-up visit. If you do not, please call clinic to make an appointment in 4-6 weeks.  Contact information: [address] [place], [place] [id number] [place] [id number] [phone]        Please schedule this patient for Postpartum visit in: 4 weeks with the following provider: Any provider For C/S patients schedule nurse incision check in weeks 2 weeks: no High risk pregnancy complicated by: CHTN, hyperemesis, fetal demise Delivery mode:  SVD Anticipated Birth Control:  IUD PP Procedures needed: BP check  Schedule Integrated [place] visit: no  Newborn Data: Live born female  Birth Weight: 4 lb 9 oz (2070 g) APGAR: 9, 9  Newborn Delivery   Birth date/time: 01/15/2019 12:11:00 Delivery type: Vaginal, Spontaneous     Baby Feeding: Breast Disposition:rooming in  01/17/2019 Glenice Bow, DO

## 2019-01-16 ENCOUNTER — Ambulatory Visit (HOSPITAL_COMMUNITY): Payer: Medicaid Other

## 2019-01-16 ENCOUNTER — Other Ambulatory Visit (HOSPITAL_COMMUNITY): Payer: Managed Care, Other (non HMO)

## 2019-01-16 LAB — CULTURE, OB URINE

## 2019-01-16 LAB — CBC
HCT: 24.3 % — ABNORMAL LOW (ref 36.0–46.0)
Hemoglobin: 8.5 g/dL — ABNORMAL LOW (ref 12.0–15.0)
MCH: 32.9 pg (ref 26.0–34.0)
MCHC: 35 g/dL (ref 30.0–36.0)
MCV: 94.2 fL (ref 80.0–100.0)
Platelets: 187 10*3/uL (ref 150–400)
RBC: 2.58 MIL/uL — ABNORMAL LOW (ref 3.87–5.11)
RDW: 12.9 % (ref 11.5–15.5)
WBC: 28.5 10*3/uL — ABNORMAL HIGH (ref 4.0–10.5)
nRBC: 0 % (ref 0.0–0.2)

## 2019-01-16 NOTE — Clinical Social Work Maternal (Addendum)
CLINICAL SOCIAL WORK MATERNAL/CHILD NOTE  Patient Details  Name: Tammy Andrews MRN: [id number] Date of Birth: [birth year]  Date:  01/16/2019  Clinical Social Worker Initiating Note:  [place]ammy [place] Date/Time: Initiated:  01/16/19/0904     Child's Name:  Tammy Andrews   Biological Parents:  Mother, Father([place] and [place] DOB: 02/05/1989)   Need for Interpreter:  None   Reason for Referral:  Behavioral Health Concerns, Current Substance Use/Substance Use During Pregnancy    Address:  [place] [address]    Phone number:  [phone] (home)     Additional phone number:  Household Members/Support Persons (HM/SP):   Household Member/Support Person 1   HM/SP Name Relationship DOB or Age  HM/SP -1 Tammy [place] FOB 02/05/1989  HM/SP -2        HM/SP -3        HM/SP -4        HM/SP -5        HM/SP -6        HM/SP -7        HM/SP -8          Natural Supports (not living in the home):  Extended Family, Immediate Family(MOB reported most of their supports live in [place])   Professional Supports: None   Employment: Full-time   Type of Work: Public house manager   Education:  [place] arranged:    Museum/gallery curator Resources:  Multimedia programmer   Other Resources:  (CSW provided information for [place] and food stamps at [place] request)   Cultural/Religious Considerations Which May Impact Care:    Strengths:  Ability to meet basic needs , Home prepared for child , Pediatrician chosen   Psychotropic Medications:         Pediatrician:    [place]  Pediatrician List:   [facility] (Peds)  [place]      Pediatrician Fax Number:    Risk Factors/Current Problems:      Cognitive State:  Alert , Able to Concentrate , Insightful , Linear Thinking    Mood/Affect:  Bright , Calm , Comfortable ,  Happy , Interested , Relaxed    CSW Assessment:  CSW received consult for history of anxiety and depression and for THC use during pregnancy.  CSW met with MOB to offer support and complete assessment.    MOB sitting up in bed holding infant, when CSW entered the room. FOB also present and at bedside. CSW introduced self and received verbal permission from MOB to complete assessment with FOB present. CSW explained reason for consult and MOB and FOB expressed understanding. MOB and FOB were both very pleasant, easy to engage and attentive and appropriate with infant throughout visit. MOB reported she and FOB recently moved from [place] last January and are now living in [place]. MOB reported she currently works at [place], full-time but that they are giving her time off to recover. MOB stated that she has applied for food stamps but has not heard anything back and requested number to follow up which CSW provided. MOB also stated she has had WIC in the past and expressed interest in reapplying. CSW provided MOB with website and phone number to follow up with [place].  CSW inquired about MOB's mental health history and MOB acknowledged having a history of anxiety  that was diagnosed back in 2018. MOB denied being on any medications or receiving counseling. MOB shared that she experienced some anxiety during her pregnancy but attributed it to her hyperemesis. CSW provided education regarding the baby blues period vs. perinatal mood disorders, discussed treatment and gave resources for mental health follow up if concerns arise.  CSW recommends self-evaluation during the postpartum time period using the Tammy Mom Checklist from Postpartum Progress and encouraged MOB to contact a medical professional if symptoms are noted at any time. MOB denied any current SI or HI and reported primary support is FOB as most of her supports live in [place].   CSW inquired about MOB's substance use during pregnancy  and MOB openly shared that she used marijuana to help with her hyperemesis. Per MOB, she experienced intense nausea and lost 50 pounds. MOB stated marijuana was the one thing that helped manage symptoms. CSW informed MOB of [place] and explained infant's UDS came back positive for [place] and that CDS was still pending. CSW informed MOB that she would have to make CPS report due to UDS and explained what that process would look like. MOB and FOB denied any questions or concerns regarding policy or report.   MOB and FOB reported having all essential items for infant once discharged. MOB reported infant would be sleeping in a basinet once home. CSW provided review of Sudden Infant Death Syndrome (SIDS) precautions and safe sleeping habits.    CSW made [place] CPS report due to infant's UDS being positive for THC. No barriers to discharge, at this time, CPS to follow up with MOB within 72 hours.   CSW Plan/Description:  No Further Intervention Required/No Barriers to Discharge, Sudden Infant Death Syndrome (SIDS) Education, Perinatal Mood and Anxiety Disorder (PMADs) Education, [place], Child Protective Service Report , CSW Will Continue to Monitor Umbilical Cord Tissue Drug Screen Results and Make Report if [place], [place] 01/16/2019, 9:43 AM

## 2019-01-16 NOTE — Progress Notes (Signed)
CSW escorted CPS worker Nina Rucker to MOB's room.  CPS reported that there are not barriers to infant's discharge to MOB. CSW will continue to provide family with resources and supports after family discharges.   Bonni Neuser Boyd-Gilyard, MSW, LCSW Clinical Social Work [phone] 

## 2019-01-16 NOTE — Progress Notes (Signed)
Post Partum Day 1 Subjective: no complaints, up ad lib, voiding and tolerating PO  Objective: Blood pressure 129/85, pulse 84, temperature 97.9 F (36.6 C), temperature source Oral, resp. rate 14, height 5\' 6"  (1.676 m), weight 97.1 kg, last menstrual period 04/19/2018, SpO2 100 %, unknown if currently breastfeeding.  Physical Exam:  General: alert, cooperative and no distress Lochia: appropriate Uterine Fundus: firm Incision: n/a DVT Evaluation: No evidence of DVT seen on physical exam. Negative Homan's sign. No cords or calf tenderness. No significant calf/ankle edema.  Recent Labs    01/14/19 2228 01/16/19 0539  HGB 11.1* 8.5*  HCT 32.7* 24.3*    Assessment/Plan: Plan for discharge tomorrow, Breastfeeding and Lactation consult   LOS: 2 days   Truett Mainland 01/16/2019, 7:06 AM

## 2019-01-16 NOTE — Lactation Note (Addendum)
This note was copied from a baby's chart. Lactation Consultation Note  Patient Name: [place] [id number] Date: 01/16/2019 Reason for consult: Late-preterm 34-36.6wks  Infant is 48 hrs old. Mom says infant falls asleep at the breast after 5-10 min. I observed infant feed well at the breast for a total of 4 minutes. I encouraged Mom to follow all feedings at breast with a bottle of EBM/formula to ensure adequate intake.   Mom has a Lansinoh pump at home. Mom was shown how to assemble & use hand pump (single- & double-mode) that was included in her DEBP pump kit. I encouraged Mom to pump after feedings, as infant is likely under-stimulating her breasts b/c of her late preterm status.  Mom had a gastric sleeve procedure and lost 110 lbs as a result. On breast exam, one can see that she used to have larger breasts and now has some excess skin. Mom reports that her breasts got wider with pregnancy. On initial exam, Mom appears to have widely-spaced breasts, BUT the bases of her breasts are not narrow, which is the more important feature. Mom also noted to have good veins. Mom was recently able to express 6 ml of EBM (pumping + hand expression).   Parents have been using a slow-flow Similac nipple. Parents to call me to observe next bottle feeding to help determine appropriate nipple flow rate.   [name] [facility] 01/16/2019, 1:40 PM

## 2019-01-16 NOTE — Lactation Note (Signed)
This note was copied from a baby's chart. Lactation Consultation Note  Patient Name: Tammy Andrews [id number] Date: 01/16/2019 Reason for consult: Late-preterm 34-36.6wks  Extra slow-flow nipple tried. Infant did better compared to Similac slow-flow nipple, but I felt like infant would benefit from an even slower nipple.  I contacted Leretha Dykes, SLP to see if she could come, as infant was still trying to feed.   [name] [facility] 01/16/2019, 3:56 PM

## 2019-01-16 NOTE — Lactation Note (Signed)
This note was copied from a baby's chart. Lactation Consultation Note Baby 76 hrs old. Mom stated she hadn't BF yet just been giving formula. Baby had bath while teaching mom hand expression collecting 2 ml colostrum. Baby placed in football position latched well. Mom has tubular breast w/everted compressible nipples at bottom end of breast. DEBP is set up and mom has pumped. Mom stated nothing came out when she pumped. Mom knows to pump q3h for 15-20 min. Encouraged mom to hand express after pumping. Mom is supplementing w/22 cal. Similac. LPI information sheet given and reviewed. Mom stated understanding. Mom knows to give colostrum first and separate from formula after BF. Understands not to feed longer than 30 min.  Mom encouraged to waken baby for feeds if hasn't cued in 3-4 hrs. 36 wks. Baby's w/low BW may need 4 hrs to rest. LPI behavior, feeding habits discussed. Discussed importance of STS and I&O. Encouraged mom to call for assistance or questions. Lactation brochure given.  Mom has DEBP at home.  Patient Name: [place] [id number] Date: 01/16/2019 Reason for consult: Initial assessment;Infant < 6lbs;1st time breastfeeding;Late-preterm 34-36.6wks   Maternal Data Has patient been taught Hand Expression?: Yes Does the patient have breastfeeding experience prior to this delivery?: No  Feeding Feeding Type: Breast Fed Nipple Type: Slow - flow  LATCH Score Latch: Grasps breast easily, tongue down, lips flanged, rhythmical sucking.  Audible Swallowing: A few with stimulation  Type of Nipple: Everted at rest and after stimulation  Comfort (Breast/Nipple): Soft / non-tender  Hold (Positioning): Assistance needed to correctly position infant at breast and maintain latch.  LATCH Score: 8  Interventions Interventions: Breast feeding basics reviewed;Adjust position;DEBP;Assisted with latch;Support pillows;Skin to skin;Position options;Breast massage;Expressed milk;Hand  express;Breast compression  Lactation Tools Discussed/Used Tools: Pump Breast pump type: Double-Electric Breast Pump WIC Program: Yes Pump Review: Setup, frequency, and cleaning;Milk Storage Initiated by:: RN Date initiated:: 01/15/19   Consult Status Consult Status: Follow-up Date: 01/17/19 Follow-up type: In-patient    Andreah Goheen, Elta Guadeloupe 01/16/2019, 2:32 AM

## 2019-01-17 LAB — CULTURE, BETA STREP (GROUP B ONLY)

## 2019-01-17 MED ORDER — SENNOSIDES-DOCUSATE SODIUM 8.6-50 MG PO TABS: 2.0000 | ORAL_TABLET | Freq: Every evening | ORAL | Status: DC | PRN

## 2019-01-17 MED ORDER — IBUPROFEN 600 MG PO TABS: 600.0000 mg | ORAL_TABLET | Freq: Three times a day (TID) | ORAL | 0 refills | Status: DC

## 2019-01-21 ENCOUNTER — Telehealth: Payer: Self-pay | Admitting: Family Medicine

## 2019-01-21 NOTE — Telephone Encounter (Signed)
Attempted to call patient about her appointment on 7/7 @ 9:00. No answer, left voicemail with appointment information. Patient was instructed to wear a face mask for the appointment and no visitors can attend with her. Patient also instructed not to attend the appointment if she has any symptoms. Symptoms list and office left if needing to reschedule.

## 2019-01-22 ENCOUNTER — Ambulatory Visit: Payer: Managed Care, Other (non HMO)

## 2019-01-23 ENCOUNTER — Ambulatory Visit (HOSPITAL_COMMUNITY): Payer: Medicaid Other

## 2019-01-23 ENCOUNTER — Other Ambulatory Visit (HOSPITAL_COMMUNITY): Payer: Managed Care, Other (non HMO)

## 2019-01-24 ENCOUNTER — Ambulatory Visit: Payer: Self-pay

## 2019-01-24 ENCOUNTER — Other Ambulatory Visit (HOSPITAL_COMMUNITY): Payer: Self-pay | Admitting: Lactation Services

## 2019-01-24 MED ORDER — PRENATAL VITAMINS 28-0.8 MG PO TABS: 1.0000 | ORAL_TABLET | Freq: Every day | ORAL | 6 refills | Status: DC

## 2019-01-24 NOTE — Lactation Note (Signed)
Lactation Consultation Note  Patient Name: Tammy Andrews ESPQZ'R Date: 01/24/2019     Maternal Data    Feeding    LATCH Score                   Interventions    Lactation Tools Discussed/Used     Consult Status      Debby Freiberg Katsumi Wisler 01/24/2019, 10:50 AM

## 2019-01-24 NOTE — Lactation Note (Signed)
This note was copied from a baby's chart. Lactation Consultation Note  Patient Name: Tammy Andrews OVZCH'Y Date: 01/24/2019     01/24/2019  Name: Tammy Andrews MRN: [id number] Date of Birth: 01/15/2019 Gestational Age: Gestational Age: [redacted]w[redacted]d Birth Weight: 73 oz Weight today:    4 pounds 5.6 ounces (1974 grams) with clean preemie diaper  82 day old LPT infant presents today with mom and dad for feeding assessment.   Infant has gained 45 grams in the last 7 days with an average daily weight gain of 6 grams a day. Infant was 4 pounds 2 ounces yesterday at peds office. Infant was started on taking Neosure after feeding yesterday.   Infant self awakens to feed about every 3 hours and feeds on both breasts for about 10-15 minutes each. She is then bottle fed formula after each feeding since yesterday.   Infant latched to the breast and fed well. She was then offered the bottle of formula and took very little. Infant was asleep post feeding. Mom pumped and obtained 15 cc EBM. Pump worked well today.   Mom has not been pumping as she thought her pump was not working and she is hand expressing about 2-3 x a day and getting 30 ml EBM. Reviewed supply and demand and importance of pumping until she is able to transfer consistently.   Mom not taking PNV, enc her to do so since she is BF and has had Gastric Sleeve.   Infant to follow up with Dr. Sheran Lawless tomorrow. Infant to follow up with Lactation as needed at [facility] request. Mom to call with questions/concerns as needed.    General Information: Mother's reason for visit: Feeding assessment, LPT infant, < 5 pounds Consult: Initial Lactation consultant: Ivin Booty Taeler Winning RN,IBCLC Breastfeeding experience: BF with each feeding, sometimes infant will not latch Maternal medical conditions: Pregnancy induced hypertension, Gastric bypass(Gastric Sleeve) Maternal medications: Motrin (ibuprofen)  Breastfeeding History: Frequency of breast feeding: every  3 hours if infant will latch Duration of feeding: 20 minutes, uses both breasts  Supplementation: Supplement method: bottle(Nfant Gold Nipple per SLP) Brand: Similac Formula volume: 1 ounce Formula frequency: every 3 hours Total formula volume per day: 8 ounces Breast milk volume: 30 ml Breast milk frequency: 2-3 x a day   Pump type: (Lansinoh DEBP) Pump frequency: not currently pumping, hand expresses 2-3 x a day Pump volume: 30 ml  Infant Output Assessment: Voids per 24 hours: 6-8 Urine color: Clear yellow Stools per 24 hours: 3-4 Stool color: Yellow  Breast Assessment: Breast: Soft, Compressible, Other(excess skin after losing a lot of weight) Nipple: Erect Pain level: 0 Pain interventions: Bra  Feeding Assessment: Infant oral assessment: WNL   Positioning: Football Latch: 2 - Grasps breast easily, tongue down, lips flanged, rhythmical sucking. Audible swallowing: 2 - Spontaneous and intermittent Type of nipple: 2 - Everted at rest and after stimulation Comfort: 2 - Soft/non-tender Hold: 2 - No assistance needed to correctly position infant at breast LATCH score: 10 Latch assessment: Deep Lips flanged: Yes Suck assessment: Displays both   Pre-feed weight: 1974 grams Post feed weight: 2002 grams Amount transferred: 28 ml Amount supplemented: 0  Additional Feeding Assessment: Infant oral assessment: WNL   Positioning: Football(right breast, 5 minutes) Latch: 1 - Repeated attempts neede to sustain latch, nipple held in mouth throughout feeding, stimulation needed to elicit sucking reflex. Audible swallowing: 1 - A few with stimulation Type of nipple: 2 - Everted at rest and after stimulation Comfort: 2 - Soft/non-tender  Hold: 2 - No assistance needed to correctly position infant at breast LATCH score: 8 Latch assessment: Deep Lips flanged: Yes Suck assessment: Displays both   Pre-feed weight: 2002 grams Post feed weight: 2010 grams Amount transferred: 8  ml Amount supplemented: 5 ml Neosure via gold nfant nipple  Totals: Total amount transferred: 36 ml Total supplement given: 5 ml Total amount pumped post feed: 15 ml EBM   Plan:  1. Offer infant the breast with feeding cues 2. Keep infant awake with feeding as needed, feed her at the breast skin to skin  3. Massage/compress breast with feeding as needed to keep infant awake and active 4. Offer infant a 1/2 ounce in the bottle prior to latch if she is frustrated at the breast 4. Continue to offer the bottle of pumped breast milk or formula after breast feeding, especially if she is cueing to feed. Give her an ounce and if she wants more she can have it 5. Continue to use the Gold Nfant nipple 6. If not breast feeding, Infant needs about 36-48 ml (1.5-2 ounces) for 8 feedings a day or 285-380 ml (10-13 ounces) in 24 hours. Infant may take more or less depending on how often she feeds. Feed infant until she is satisfied.  7. Would recommend that you pump about 7-8 x a day after breast feeding or if infant will not feed. Use your Double electric breast pump and pump for about 15 minutes. Hand express after pumping.  8. Continue your prenatal vitamins 9. Keep up the good work 10. Thank you for allowing me to assist you today 11. Please call with any questions/concerns as needed [phone] 12. Follow up with Lactation as needed   Ed BlalockSharon S Erland Vivas RN, IBCLC                                                    Ed BlalockSharon S Kenleigh Toback 01/24/2019, 9:38 AM

## 2019-01-31 ENCOUNTER — Ambulatory Visit (INDEPENDENT_AMBULATORY_CARE_PROVIDER_SITE_OTHER): Payer: Managed Care, Other (non HMO)

## 2019-01-31 ENCOUNTER — Encounter: Payer: Self-pay | Admitting: Obstetrics & Gynecology

## 2019-01-31 ENCOUNTER — Other Ambulatory Visit: Payer: Self-pay

## 2019-01-31 VITALS — Wt 199.0 lb

## 2019-01-31 DIAGNOSIS — Z0130 Encounter for examination of blood pressure without abnormal findings: Secondary | ICD-10-CM

## 2019-01-31 NOTE — Progress Notes (Signed)
Pt here today for BP check.  Pt dx with chronic HTN in pregnancy.  Pt reports having bounding headaches.   Per chart review pt was not prescribed BP medication.  BP 148/102  manually LA.  Notified Kerry Hough, PA pt's BP and sx's.  Provider recommends that pt goes to [facility] for preeclampsia evaluation.  Notified pt provider's recommendation.  Pt agreed and stated that she will go to [facility] now.  Notified Olivia Mackie, [place] nurse, of pt's arrival.    [place], [place] 01/31/19

## 2019-01-31 NOTE — Progress Notes (Signed)
Patient seen and assessed by nursing staff during this encounter. I have reviewed the chart and agree with the documentation and plan.  Kerry Hough, PA-C 01/31/2019 12:28 PM

## 2019-02-12 ENCOUNTER — Other Ambulatory Visit: Payer: Self-pay

## 2019-02-12 ENCOUNTER — Ambulatory Visit (INDEPENDENT_AMBULATORY_CARE_PROVIDER_SITE_OTHER): Payer: Managed Care, Other (non HMO) | Admitting: Family Medicine

## 2019-02-12 DIAGNOSIS — Z1389 Encounter for screening for other disorder: Secondary | ICD-10-CM

## 2019-02-12 DIAGNOSIS — I10 Essential (primary) hypertension: Secondary | ICD-10-CM

## 2019-02-12 DIAGNOSIS — Z30430 Encounter for insertion of intrauterine contraceptive device: Secondary | ICD-10-CM

## 2019-02-12 LAB — POCT PREGNANCY, URINE
Preg Test, Ur: NEGATIVE
Preg Test, Ur: NEGATIVE

## 2019-02-12 MED ORDER — LEVONORGESTREL 19.5 MCG/DAY IU IUD
INTRAUTERINE_SYSTEM | Freq: Once | INTRAUTERINE | Status: AC
  Administered 2019-02-12: 16:00:00 via INTRAUTERINE

## 2019-02-12 NOTE — Patient Instructions (Signed)
Intrauterine Device Insertion, Care After  This sheet gives you information about how to care for yourself after your procedure. Your health care provider may also give you more specific instructions. If you have problems or questions, contact your health care provider. What can I expect after the procedure? After the procedure, it is common to have:  Cramps and pain in the abdomen.  Light bleeding (spotting) or heavier bleeding that is like your menstrual period. This may last for up to a few days.  Lower back pain.  Dizziness.  Headaches.  Nausea. Follow these instructions at home:  Before resuming sexual activity, check to make sure that you can feel the IUD string(s). You should be able to feel the end of the string(s) below the opening of your cervix. If your IUD string is in place, you may resume sexual activity. ? If you had a hormonal IUD inserted more than 7 days after your most recent period started, you will need to use a backup method of birth control for 7 days after IUD insertion. Ask your health care provider whether this applies to you.  Continue to check that the IUD is still in place by feeling for the string(s) after every menstrual period, or once a month.  Take over-the-counter and prescription medicines only as told by your health care provider.  Do not drive or use heavy machinery while taking prescription pain medicine.  Keep all follow-up visits as told by your health care provider. This is important. Contact a health care provider if:  You have bleeding that is heavier or lasts longer than a normal menstrual cycle.  You have a fever.  You have cramps or abdominal pain that get worse or do not get better with medicine.  You develop abdominal pain that is new or is not in the same area of earlier cramping and pain.  You feel lightheaded or weak.  You have abnormal or bad-smelling discharge from your vagina.  You have pain during sexual activity.   You have any of the following problems with your IUD string(s): ? The string bothers or hurts you or your sexual partner. ? You cannot feel the string. ? The string has gotten longer.  You can feel the IUD in your vagina.  You think you may be pregnant, or you miss your menstrual period.  You think you may have an STI (sexually transmitted infection). Get help right away if:  You have flu-like symptoms.  You have a fever and chills.  You can feel that your IUD has slipped out of place. Summary  After the procedure, it is common to have cramps and pain in the abdomen. It is also common to have light bleeding (spotting) or heavier bleeding that is like your menstrual period.  Continue to check that the IUD is still in place by feeling for the string(s) after every menstrual period, or once a month.  Keep all follow-up visits as told by your health care provider. This is important.  Contact your health care provider if you have problems with your IUD string(s), such as the string getting longer or bothering you or your sexual partner. This information is not intended to replace advice given to you by your health care provider. Make sure you discuss any questions you have with your health care provider. Document Released: 03/02/2011 Document Revised: 06/16/2017 Document Reviewed: 05/25/2016 Elsevier Patient Education  2020 Elsevier Inc.  

## 2019-02-12 NOTE — Progress Notes (Signed)
Subjective:     Tammy [place] is a 28 y.o. female who presents for a postpartum visit. She is 4 weeks postpartum following a spontaneous vaginal delivery. I have fully reviewed the prenatal and intrapartum course. The delivery was at [redacted]w[redacted]d gestational weeks. Outcome: spontaneous vaginal delivery. Anesthesia: epidural. Postpartum course has been uncomplicated, starting to get a more sleep. Husband very supportive, rest of family in [place] and hasn't been able to see because of Covid. Baby's course has been unremarkable, doing well and gaining weight better. Baby is feeding by both breast and bottle - Similac Neosure. Bleeding staining only. Bowel function is normal. Bladder function is normal. Patient is not sexually active. Contraception method is IUD. Postpartum depression screening: negative.  The following portions of the patient's history were reviewed and updated as appropriate: allergies, current medications, past family history, past medical history, past social history, past surgical history and problem list.  Review of Systems Pertinent items noted in HPI and remainder of comprehensive ROS otherwise negative.   Objective:    BP 135/86   Pulse 74   Temp 98.1 F (36.7 C)   Wt 213 lb 8 oz (96.8 kg)   BMI 34.46 kg/m   General:  alert, well-appearing, NAD   Breasts:  deferred  Lungs: clear to auscultation bilaterally  Heart:  regular rate and rhythm, S1, S2 normal, no murmur, click, rub or gallop  Abdomen: soft, non-tender; bowel sounds normal; no masses,  no organomegaly   Vulva:  normal  Vagina: normal vagina, no discharge, exudate, lesion, or erythema  Cervix:  anteverted        Assessment:   Routine postpartum exam. Pap smear not done at today's visit - was normal 08/2018.   Plan:    1. Contraception: IUD 2. Mood stable 3. Follow-up in 4-6 weeks for IUD string check then prn. 4. Chronic HTN - BP stable today, asymptomatic. Encouraged to follow-up with PCP.   Procedure  Note: Pt consented for IUD insertion.  Pt placed in lithotomy position.  Speculum inserted and cervix cleaned with betadine solution.  Uterus sounded to 8 cm; Liletta IUD inserted without difficulty.  Strings cut at approximately 3 cm.  Speculum removed. Tolerated well.  Lambert Mody. Juleen China, DO OB/GYN Fellow

## 2019-02-13 ENCOUNTER — Encounter: Payer: Self-pay | Admitting: Family Medicine

## 2019-02-13 DIAGNOSIS — I10 Essential (primary) hypertension: Secondary | ICD-10-CM | POA: Insufficient documentation

## 2019-02-26 ENCOUNTER — Encounter: Payer: Self-pay | Admitting: *Deleted

## 2019-03-05 ENCOUNTER — Telehealth (HOSPITAL_COMMUNITY): Payer: Self-pay | Admitting: Lactation Services

## 2019-03-05 NOTE — Telephone Encounter (Signed)
Outgoing telephone call :  For lactation - mom called in regards to requesting another [facility] O/P visit due to her baby having a difficult time taking a bottle and she has had to return to work.  LC explored options with mom and recommended feeding the baby at the 1st breast and for the 2nd breast have dad give her a bottle. Per mom had not tried that yet. And do this regimen 2-3  Times a day until it improves.  Mom was also asking for a note for work to be able to pump otherwise her  [employer] would not allow it.  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  Mom expressed she feels she would  Feel better having a note for her boss.  LC recommended she call her OB MD.  [place] asked mom if she desired to try the above recommendations prior to making an LC O/P appt. And she said yes.  LC reminded mom to call us back if further problems.

## 2019-03-22 ENCOUNTER — Telehealth: Payer: Self-pay | Admitting: Student

## 2019-03-22 NOTE — Telephone Encounter (Signed)
Attempted to call patient about her appointment on 9/8 @ 11:15. No answer left voicemail instructing patient to wear a face mask for the entire appointment and no visitors are allowed during the visit. Patient instructed not to attend the appointment if she was any symptoms. Symptom list and office number left.

## 2019-03-26 ENCOUNTER — Encounter: Payer: Self-pay | Admitting: Student

## 2019-03-26 ENCOUNTER — Ambulatory Visit: Payer: Medicaid Other | Admitting: Student

## 2019-03-26 ENCOUNTER — Encounter: Payer: Self-pay | Admitting: *Deleted

## 2019-03-26 NOTE — Progress Notes (Signed)
Pt DNKA today for scheduled visit for IUD string check. Per Jorje Guild, NP appt can be rescheduled @ pt's request if desired.

## 2019-06-16 ENCOUNTER — Other Ambulatory Visit: Payer: Self-pay

## 2019-06-16 ENCOUNTER — Emergency Department (HOSPITAL_COMMUNITY): Payer: Medicaid Other

## 2019-06-16 ENCOUNTER — Encounter (HOSPITAL_COMMUNITY): Payer: Self-pay | Admitting: Emergency Medicine

## 2019-06-16 ENCOUNTER — Emergency Department (HOSPITAL_COMMUNITY)
Admission: AD | Admit: 2019-06-16 | Discharge: 2019-06-17 | Disposition: A | Discharge status: Home / Self Care | Payer: Medicaid Other | Attending: Emergency Medicine | Admitting: Emergency Medicine

## 2019-06-16 DIAGNOSIS — R1032 Left lower quadrant pain: Secondary | ICD-10-CM | POA: Diagnosis present

## 2019-06-16 DIAGNOSIS — T8339XA Other mechanical complication of intrauterine contraceptive device, initial encounter: Secondary | ICD-10-CM | POA: Insufficient documentation

## 2019-06-16 DIAGNOSIS — Y69 Unspecified misadventure during surgical and medical care: Secondary | ICD-10-CM | POA: Diagnosis not present

## 2019-06-16 DIAGNOSIS — J45909 Unspecified asthma, uncomplicated: Secondary | ICD-10-CM | POA: Diagnosis not present

## 2019-06-16 DIAGNOSIS — I10 Essential (primary) hypertension: Secondary | ICD-10-CM | POA: Insufficient documentation

## 2019-06-16 DIAGNOSIS — T839XXA Unspecified complication of genitourinary prosthetic device, implant and graft, initial encounter: Secondary | ICD-10-CM

## 2019-06-16 DIAGNOSIS — Z975 Presence of (intrauterine) contraceptive device: Secondary | ICD-10-CM

## 2019-06-16 LAB — CBC
HCT: 40 % (ref 36.0–46.0)
Hemoglobin: 13.5 g/dL (ref 12.0–15.0)
MCH: 30.8 pg (ref 26.0–34.0)
MCHC: 33.8 g/dL (ref 30.0–36.0)
MCV: 91.3 fL (ref 80.0–100.0)
Platelets: 277 10*3/uL (ref 150–400)
RBC: 4.38 MIL/uL (ref 3.87–5.11)
RDW: 12.6 % (ref 11.5–15.5)
WBC: 11.8 10*3/uL — ABNORMAL HIGH (ref 4.0–10.5)
nRBC: 0 % (ref 0.0–0.2)

## 2019-06-16 LAB — COMPREHENSIVE METABOLIC PANEL
ALT: 21 U/L (ref 0–44)
AST: 23 U/L (ref 15–41)
Albumin: 3.9 g/dL (ref 3.5–5.0)
Alkaline Phosphatase: 59 U/L (ref 38–126)
Anion gap: 9 (ref 5–15)
BUN: 20 mg/dL (ref 6–20)
CO2: 22 mmol/L (ref 22–32)
Calcium: 9.1 mg/dL (ref 8.9–10.3)
Chloride: 107 mmol/L (ref 98–111)
Creatinine, Ser: 1.03 mg/dL — ABNORMAL HIGH (ref 0.44–1.00)
GFR calc Af Amer: >60 mL/min (ref >60)
GFR calc non Af Amer: >60 mL/min (ref >60)
Glucose, Bld: 117 mg/dL — ABNORMAL HIGH (ref 70–99)
Potassium: 3.5 mmol/L (ref 3.5–5.1)
Sodium: 138 mmol/L (ref 135–145)
Total Bilirubin: 0.9 mg/dL (ref 0.3–1.2)
Total Protein: 7.8 g/dL (ref 6.5–8.1)

## 2019-06-16 LAB — URINALYSIS, ROUTINE W REFLEX MICROSCOPIC
Bacteria, UA: NONE SEEN
Bilirubin Urine: NEGATIVE
Glucose, UA: NEGATIVE mg/dL
Ketones, ur: NEGATIVE mg/dL
Leukocytes,Ua: NEGATIVE
Nitrite: NEGATIVE
Protein, ur: 100 mg/dL — AB
Specific Gravity, Urine: 1.028 (ref 1.005–1.030)
pH: 6 (ref 5.0–8.0)

## 2019-06-16 LAB — LIPASE, BLOOD: Lipase: 29 U/L (ref 11–51)

## 2019-06-16 LAB — I-STAT BETA HCG BLOOD, ED (MC, WL, AP ONLY): I-stat hCG, quantitative: <5 m[IU]/mL (ref <5)

## 2019-06-16 MED ORDER — ONDANSETRON HCL 4 MG/2ML IJ SOLN
4.0000 mg | Freq: Once | INTRAMUSCULAR | Status: AC
  Administered 2019-06-16: 4 mg via INTRAVENOUS
  Filled 2019-06-16: qty 2

## 2019-06-16 MED ORDER — SODIUM CHLORIDE 0.9% FLUSH: 3.0000 mL | Freq: Once | INTRAVENOUS | Status: DC

## 2019-06-16 MED ORDER — LACTATED RINGERS IV BOLUS
1000.0000 mL | Freq: Once | INTRAVENOUS | Status: AC
  Administered 2019-06-17: 1000 mL via INTRAVENOUS

## 2019-06-16 MED ORDER — HYDROMORPHONE HCL 1 MG/ML IJ SOLN
1.0000 mg | Freq: Once | INTRAMUSCULAR | Status: AC
  Administered 2019-06-16: 1 mg via INTRAVENOUS
  Filled 2019-06-16: qty 1

## 2019-06-16 MED ORDER — HYDROMORPHONE HCL 1 MG/ML IJ SOLN
1.0000 mg | Freq: Once | INTRAMUSCULAR | Status: AC
  Administered 2019-06-16: 22:00:00 1 mg via INTRAVENOUS
  Filled 2019-06-16: qty 1

## 2019-06-16 NOTE — ED Triage Notes (Signed)
Pt to ED with c/o left lower abd pain and left lower back pain onset yesterday.  Pt also c/o nausea, vomiting and diarrhea.  Pt denies urinary symptoms or vag discharge

## 2019-06-16 NOTE — MAU Note (Signed)
Urine in lab 

## 2019-06-16 NOTE — ED Notes (Signed)
Patient transported to CT 

## 2019-06-16 NOTE — MAU Provider Note (Signed)
Tammy Andrews 28 y.o.  Pregnancy test is negative.  Client crying with LLQ abdominal pain that she has been having for 2 days.  No vaginal bleeding.  Has IUD since July.  Took Tylenol this morning.  Has been vomiting.  Has trouble sitting still.  Vitals are stable.  Will be seen in ER as she is not pregnant.  Client agrees as she wants to be seen for care tonight.  Transported to ER.  Report called by triage nurse.  Earlie Server, RN, MSN, NP-BC Nurse Practitioner, [facility] for [employer] [place] [facility] 06/16/2019 7:19 PM

## 2019-06-16 NOTE — MAU Note (Addendum)
Patient reports to MAU c/o lower left sided abdominal pain that started yesterday morning. Patient reports NV x2days. Pt crying on hands and knees in triage. Pt reports LMP was 3 weeks ago. Patient denies being pregnant.   BP: 116/79 Pulse: 93 Temp: 98.7 Pain: 10/10  NEG UPT  MSE completed by Earlie Server NP   [place] [facility] notified.

## 2019-06-16 NOTE — MAU Note (Signed)
Pt called, not in lobby 

## 2019-06-17 ENCOUNTER — Telehealth: Payer: Self-pay | Admitting: Family Medicine

## 2019-06-17 ENCOUNTER — Ambulatory Visit: Payer: Managed Care, Other (non HMO) | Admitting: Family Medicine

## 2019-06-17 LAB — POC URINE PREG, ED: Preg Test, Ur: NEGATIVE

## 2019-06-17 MED ORDER — HYDROMORPHONE HCL 1 MG/ML IJ SOLN
1.0000 mg | Freq: Once | INTRAMUSCULAR | Status: AC
  Administered 2019-06-17: 1 mg via INTRAVENOUS
  Filled 2019-06-17: qty 1

## 2019-06-17 MED ORDER — KETOROLAC TROMETHAMINE 30 MG/ML IJ SOLN
30.0000 mg | Freq: Once | INTRAMUSCULAR | Status: AC
  Administered 2019-06-17: 30 mg via INTRAVENOUS
  Filled 2019-06-17: qty 1

## 2019-06-17 MED ORDER — NAPROXEN 500 MG PO TABS: 500.0000 mg | ORAL_TABLET | Freq: Two times a day (BID) | ORAL | 0 refills | Status: DC

## 2019-06-17 MED ORDER — HALOPERIDOL LACTATE 5 MG/ML IJ SOLN
5.0000 mg | Freq: Once | INTRAMUSCULAR | Status: AC
  Administered 2019-06-17: 01:00:00 5 mg via INTRAVENOUS
  Filled 2019-06-17: qty 1

## 2019-06-17 MED ORDER — OXYCODONE-ACETAMINOPHEN 5-325 MG PO TABS: 1.0000 | ORAL_TABLET | ORAL | 0 refills | Status: DC | PRN

## 2019-06-17 MED ORDER — HALOPERIDOL 5 MG PO TABS: 5.0000 mg | ORAL_TABLET | Freq: Four times a day (QID) | ORAL | 0 refills | Status: DC | PRN

## 2019-06-17 NOTE — Discharge Instructions (Addendum)
Your IUD has gone through the wall of your uterus. Pleae follow up with the gynecology office so they can make arrangements to remove it.  Return if you start running a fever, or if pain or nausea are not being adequately controlled at home.

## 2019-06-17 NOTE — ED Provider Notes (Addendum)
[facility] Note   CSN: [id number] Arrival date & time: 06/16/19  1845     History   Chief Complaint Chief Complaint  Patient presents with   Abdominal Pain   Emesis    HPI Tammy Andrews is a 28 y.o. female.     The history is provided by the patient.  Abdominal Pain Pain location:  LLQ Pain quality: cramping, sharp and shooting   Pain radiates to:  L flank Pain severity:  Severe Onset quality:  Gradual Duration:  1 day Timing:  Constant Progression:  Unchanged Chronicity:  New Context comment:  Started yesterday while she was at work Relieved by:  Nothing Worsened by:  Movement and vomiting Ineffective treatments:  None tried Associated symptoms: anorexia, nausea, vaginal bleeding and vomiting   Associated symptoms: no cough, no dysuria, no fever, no shortness of breath and no vaginal discharge   Associated symptoms comment:  Minimal loose stool but no frank diarrhea.  Sexually active with only one partner for the last 7 years. Risk factors: obesity   Risk factors: no alcohol abuse   Risk factors comment:  Does use marijuana intermittently and the last time she used was last week Emesis Associated symptoms: abdominal pain   Associated symptoms: no cough and no fever     Past Medical History:  Diagnosis Date   Anemia    Anxiety    Asthma    childhood   Depression    Hyperemesis gravidarum before end of [redacted] week gestation with dehydration 07/27/2018   16% wt loss by 19 weeks.   Hypertension    Pregnancy affected by fetal growth restriction 01/14/2019   Preterm labor     Patient Active Problem List   Diagnosis Date Noted   Presence of IUD 06/16/2019   Chronic hypertension 02/13/2019   Fetal demise in singleton pregnancy greater than [redacted] weeks gestation, antepartum 09/13/2018   Marijuana abuse 08/09/2018   H/O bariatric surgery 08/09/2018    Past Surgical History:  Procedure Laterality  Date   DILATION AND CURETTAGE OF UTERUS     LAPAROSCOPIC GASTRIC SLEEVE RESECTION  08/2014     OB History    Gravida  3   Para  2   Term  0   Preterm  2   AB  1   Living  1     SAB  0   TAB  1   Ectopic  0   Multiple  0   Live Births  1        Obstetric Comments  G2: per pt, approx 24wks, woke with bleeding (no pain) and dx with IUFD when went to hospital. IOL SVD         Home Medications    Prior to Admission medications   Medication Sig Start Date End Date Taking? Authorizing Provider  acetaminophen (TYLENOL) 500 MG tablet Take 1,000 mg by mouth every 6 (six) hours as needed for moderate pain.   Yes [provider]  ibuprofen (ADVIL) 600 MG tablet Take 1 tablet (600 mg total) by mouth 3 (three) times daily. Patient not taking: Reported on 06/16/2019 01/17/19   Tamera StandsWallace, Laurel S, DO  Prenatal Vit-Fe Fumarate-FA (PRENATAL MULTIVITAMIN) TABS tablet Take 1 tablet by mouth daily at 12 noon. Patient not taking: Reported on 06/16/2019 08/02/18   Adam PhenixArnold, James G, MD    Family History Family History  Problem Relation Age of Onset   Hypertension Father    Diabetes Paternal  Grandmother     Social History Social History   Tobacco Use   Smoking status: Never Smoker   Smokeless tobacco: Never Used  Substance Use Topics   Alcohol use: Not Currently    Comment: not in pregnancy   Drug use: Yes    Types: Marijuana    Comment: last used 12/17/18     Allergies   Pork-derived products   Review of Systems Review of Systems  Constitutional: Negative for fever.  Respiratory: Negative for cough and shortness of breath.   Gastrointestinal: Positive for abdominal pain, anorexia, nausea and vomiting.  Genitourinary: Positive for vaginal bleeding. Negative for dysuria and vaginal discharge.  All other systems reviewed and are negative.    Physical Exam Updated Vital Signs BP (!) 160/107    Pulse 84    Temp 98.9 F (37.2 C) (Oral)    Resp 20     Ht  (1.702 m)    Wt 99.8 kg    LMP 05/26/2019 (Approximate)    SpO2 95%    BMI 34.46 kg/m   Physical Exam Vitals signs and nursing note reviewed.  Constitutional:      General: She is in acute distress.     Appearance: She is well-developed.     Comments: Patient is retching on exam  HENT:     Head: Normocephalic and atraumatic.  Eyes:     Conjunctiva/sclera: Conjunctivae normal.     Pupils: Pupils are equal, round, and reactive to light.  Neck:     Musculoskeletal: Normal range of motion and neck supple.  Cardiovascular:     Rate and Rhythm: Normal rate and regular rhythm.     Heart sounds: No murmur.  Pulmonary:     Effort: Pulmonary effort is normal. No respiratory distress.     Breath sounds: Normal breath sounds. No wheezing or rales.  Abdominal:     General: There is no distension.     Palpations: Abdomen is soft.     Tenderness: There is abdominal tenderness in the left lower quadrant. There is left CVA tenderness. There is no guarding or rebound.  Genitourinary:    Vagina: Bleeding present.     Cervix: Normal.     Uterus: Normal.      Adnexa: Right adnexa normal and left adnexa normal.       Right: No mass, tenderness or fullness.         Left: No mass, tenderness or fullness.       Comments: IUD strings present Musculoskeletal: Normal range of motion.        General: No tenderness.  Skin:    General: Skin is warm and dry.     Findings: No erythema or rash.  Neurological:     Mental Status: She is alert and oriented to person, place, and time.  Psychiatric:        Behavior: Behavior normal.      ED Treatments / Results  Labs (all labs ordered are listed, but only abnormal results are displayed) Labs Reviewed  COMPREHENSIVE METABOLIC PANEL - Abnormal; Notable for the following components:      Result Value   Glucose, Bld 117 (*)    Creatinine, Ser 1.03 (*)    All other components within normal limits  CBC - Abnormal; Notable for the following  components:   WBC 11.8 (*)    All other components within normal limits  URINALYSIS, ROUTINE W REFLEX MICROSCOPIC - Abnormal; Notable for the following components:  Hgb urine dipstick SMALL (*)    Protein, ur 100 (*)    All other components within normal limits  LIPASE, BLOOD  I-STAT BETA HCG BLOOD, ED ([facility], [facility], [facility] ONLY)    EKG None  Radiology Ct Renal Stone Study  Result Date: 06/16/2019 CLINICAL DATA:  Left lower quadrant pain EXAM: CT ABDOMEN AND PELVIS WITHOUT CONTRAST TECHNIQUE: Multidetector CT imaging of the abdomen and pelvis was performed following the standard protocol without IV contrast. COMPARISON:  None. FINDINGS: Lower chest: Lung bases are clear. No effusions. Heart is normal size. Hepatobiliary: No focal hepatic abnormality. Gallbladder unremarkable. Pancreas: No focal abnormality or ductal dilatation. Spleen: No focal abnormality.  Normal size. Adrenals/Urinary Tract: Punctate nonobstructing stone in the upper pole of the right kidney. No renal stone on the left. No ureteral stones. No hydronephrosis. Adrenal glands and urinary bladder unremarkable. Stomach/Bowel: Normal appendix. Postoperative changes in the stomach. Stomach, large and small bowel grossly unremarkable. Vascular/Lymphatic: No evidence of aneurysm or adenopathy. Reproductive: IUD is in place within the uterus. This is located in the posterior myometrium, extending external to the wall of the uterus. No adnexal mass. Other: No free fluid or free air. Musculoskeletal: No acute bony abnormality. IMPRESSION: Punctate right upper pole nephrolithiasis. No ureteral stones or hydronephrosis bilaterally. IUD extends through the posterior myometrium with the arms external to the uterus. Normal appendix. Electronically Signed   By: Charlett Nose M.D.   On: 06/16/2019 22:17    Procedures Procedures (including critical care time)  Medications Ordered in ED Medications  sodium chloride flush (NS) 0.9 % injection 3 mL (0  mLs Intravenous Hold 06/16/19 2212)  lactated ringers bolus 1,000 mL (has no administration in time range)  HYDROmorphone (DILAUDID) injection 1 mg (1 mg Intravenous Given 06/16/19 2149)  ondansetron (ZOFRAN) injection 4 mg (4 mg Intravenous Given 06/16/19 2150)  HYDROmorphone (DILAUDID) injection 1 mg (1 mg Intravenous Given 06/16/19 2257)  ondansetron (ZOFRAN) injection 4 mg (4 mg Intravenous Given 06/16/19 2257)     Initial Impression / Assessment and Plan / ED Course  I have reviewed the triage vital signs and the nursing notes.  Pertinent labs & imaging results that were available during my care of the patient were reviewed by me and considered in my medical decision making (see chart for details).        Patient is a 28 year old female presenting today with severe left lower quadrant pain radiating to her flank and recurrent vomiting.  This started yesterday and has not resolved.  Patient appears very uncomfortable and has left lower quadrant pain and guarding.  On pelvic exam she does have some minimal vaginal bleeding IUD strings are present and patient has no adnexal tenderness.  Patient's UA with small amount of blood which is most likely from contamination no evidence of infection so low suspicion for pyelonephritis or UTI.  Patient CT was negative for obstructing kidney stone and no other acute pathology at this time.  Her IUD appears to be in place but possible perforation with extention through the posterior wall.  Patient has had several rounds of pain and nausea medicine with improvement but still feeling nauseated.  Unclear the cause for her pain today but low suspicion for ovarian torsion, acute bowel cause or urinary source.   Pt checked out to Dr. Preston Fleeting  Final Clinical Impressions(s) / ED Diagnoses   Final diagnoses:  None    ED Discharge Orders    None       Daphney Hopke, [place],  MD 06/17/19 3748    Blanchie Dessert, MD 06/17/19 [phone]

## 2019-06-17 NOTE — Telephone Encounter (Signed)
Attempted to reach patient about her appointment for this evening. She needs to see a Psychologist, sport and exercise. Appointment has been scheduled for in the morning.

## 2019-06-17 NOTE — ED Provider Notes (Signed)
Care assumed from Dr. Maryan Rued, patient with left lower quadrant pain and difficult to control nausea.  CT scan shows evidence that the IUD has gone through the uterine wall.  I have discussed the findings directly with the radiologist, and there are no inflammatory changes to suggest that this is acute.  I have also discussed the findings with the on-call gynecologist who does not feel that this is a cause for acute abdominal pain.  Patient does need to make follow-up appointment IUD removed.  She has been given IV fluids, hydromorphone, ketorolac, haloperidol and symptoms are significantly improved.  She feels comfortable going home at this point.  She is encouraged to follow-up with the gynecology clinic as soon as possible, and she is given strict return precautions should she develop fever, increased pain, vomiting not controlled by medication.  She is discharged with prescriptions for naproxen, oxycodone-acetaminophen, haloperidol.   Delora Fuel, MD 67/59/16 [phone]

## 2019-06-18 ENCOUNTER — Ambulatory Visit (INDEPENDENT_AMBULATORY_CARE_PROVIDER_SITE_OTHER): Payer: Medicaid Other | Admitting: Obstetrics and Gynecology

## 2019-06-18 ENCOUNTER — Telehealth: Payer: Self-pay | Admitting: Obstetrics & Gynecology

## 2019-06-18 ENCOUNTER — Emergency Department (HOSPITAL_BASED_OUTPATIENT_CLINIC_OR_DEPARTMENT_OTHER)
Admission: EM | Admit: 2019-06-18 | Discharge: 2019-06-18 | Disposition: A | Discharge status: Home / Self Care | Payer: Medicaid Other | Source: Home / Self Care | Attending: Emergency Medicine | Admitting: Emergency Medicine

## 2019-06-18 ENCOUNTER — Emergency Department (HOSPITAL_COMMUNITY)
Admission: EM | Admit: 2019-06-18 | Discharge: 2019-06-18 | Disposition: A | Discharge status: Home / Self Care | Payer: Medicaid Other | Attending: Emergency Medicine | Admitting: Emergency Medicine

## 2019-06-18 ENCOUNTER — Encounter (HOSPITAL_COMMUNITY): Payer: Self-pay

## 2019-06-18 ENCOUNTER — Other Ambulatory Visit: Payer: Self-pay

## 2019-06-18 ENCOUNTER — Encounter: Payer: Self-pay | Admitting: Family Medicine

## 2019-06-18 ENCOUNTER — Encounter: Payer: Self-pay | Admitting: Obstetrics and Gynecology

## 2019-06-18 ENCOUNTER — Encounter (HOSPITAL_BASED_OUTPATIENT_CLINIC_OR_DEPARTMENT_OTHER): Payer: Self-pay | Admitting: *Deleted

## 2019-06-18 ENCOUNTER — Encounter: Payer: Self-pay | Admitting: *Deleted

## 2019-06-18 ENCOUNTER — Encounter (HOSPITAL_BASED_OUTPATIENT_CLINIC_OR_DEPARTMENT_OTHER): Payer: Self-pay

## 2019-06-18 VITALS — BP 102/68 | HR 70 | Ht 67.0 in | Wt 230.7 lb

## 2019-06-18 DIAGNOSIS — T8332XD Displacement of intrauterine contraceptive device, subsequent encounter: Secondary | ICD-10-CM | POA: Diagnosis not present

## 2019-06-18 DIAGNOSIS — Y69 Unspecified misadventure during surgical and medical care: Secondary | ICD-10-CM | POA: Insufficient documentation

## 2019-06-18 DIAGNOSIS — G2402 Drug induced acute dystonia: Secondary | ICD-10-CM

## 2019-06-18 DIAGNOSIS — Z5321 Procedure and treatment not carried out due to patient leaving prior to being seen by health care provider: Secondary | ICD-10-CM | POA: Insufficient documentation

## 2019-06-18 DIAGNOSIS — M62838 Other muscle spasm: Secondary | ICD-10-CM

## 2019-06-18 DIAGNOSIS — G259 Extrapyramidal and movement disorder, unspecified: Secondary | ICD-10-CM | POA: Insufficient documentation

## 2019-06-18 DIAGNOSIS — T887XXA Unspecified adverse effect of drug or medicament, initial encounter: Secondary | ICD-10-CM | POA: Insufficient documentation

## 2019-06-18 DIAGNOSIS — R519 Headache, unspecified: Secondary | ICD-10-CM | POA: Insufficient documentation

## 2019-06-18 DIAGNOSIS — R102 Pelvic and perineal pain: Secondary | ICD-10-CM | POA: Diagnosis not present

## 2019-06-18 DIAGNOSIS — T434X5A Adverse effect of butyrophenone and thiothixene neuroleptics, initial encounter: Secondary | ICD-10-CM | POA: Insufficient documentation

## 2019-06-18 DIAGNOSIS — R29818 Other symptoms and signs involving the nervous system: Secondary | ICD-10-CM

## 2019-06-18 DIAGNOSIS — I10 Essential (primary) hypertension: Secondary | ICD-10-CM | POA: Insufficient documentation

## 2019-06-18 DIAGNOSIS — R220 Localized swelling, mass and lump, head: Secondary | ICD-10-CM | POA: Insufficient documentation

## 2019-06-18 DIAGNOSIS — J45909 Unspecified asthma, uncomplicated: Secondary | ICD-10-CM | POA: Insufficient documentation

## 2019-06-18 MED ORDER — DIPHENHYDRAMINE HCL 50 MG/ML IJ SOLN
50.0000 mg | Freq: Once | INTRAMUSCULAR | Status: AC
  Administered 2019-06-18: 50 mg via INTRAVENOUS
  Filled 2019-06-18: qty 1

## 2019-06-18 NOTE — ED Triage Notes (Addendum)
Pt presents via [facility] from home with face "stiffness" -  pt started haldol yesterday. Pt was seen on Nov. 30 for IUD pain - follow up on Thursday. No swelling appreciated- pt c/o R sided face pain and increased saliva. Pt LWBS at [facility].

## 2019-06-18 NOTE — Progress Notes (Signed)
GYNECOLOGY OFFICE FOLLOW UP NOTE  History:  28 y.o. [id number] here today for follow up for f/u from ED. Patient was drinking Friday night, had some abdominal pain. Saturday, she had a significant amount of pain and nausea, thought she was hungover so she stayed home from work but it got worse, so she presented to ED. Pain feels like "stabbing" in lower left abdomen, goes to her side and to her back. Had not had this pain prior last Saturday. Still having some pain and soreness but is not nearly as bad as it was. Also with significant nausea/vomiting on Saturday but has improved since yesterday. She is very sore from the nausea.   Past Medical History:  Diagnosis Date   Anemia    Anxiety    Asthma    childhood   Depression    Hyperemesis gravidarum before end of [redacted] week gestation with dehydration 07/27/2018   16% wt loss by 19 weeks.   Hypertension    PIH   Pregnancy affected by fetal growth restriction 01/14/2019   Preterm labor     Past Surgical History:  Procedure Laterality Date   DILATION AND CURETTAGE OF UTERUS     LAPAROSCOPIC GASTRIC SLEEVE RESECTION  08/2014     Current Outpatient Medications:    acetaminophen (TYLENOL) 500 MG tablet, Take 1,000 mg by mouth every 6 (six) hours as needed for moderate pain., Disp: , Rfl:    naproxen (NAPROSYN) 500 MG tablet, Take 1 tablet (500 mg total) by mouth 2 (two) times daily., Disp: 30 tablet, Rfl: 0   oxyCODONE-acetaminophen (PERCOCET) 5-325 MG tablet, Take 1 tablet by mouth every 4 (four) hours as needed for moderate pain., Disp: 10 tablet, Rfl: 0  The following portions of the patient's history were reviewed and updated as appropriate: allergies, current medications, past family history, past medical history, past social history, past surgical history and problem list.   Review of Systems:  Pertinent items noted in HPI and remainder of comprehensive ROS otherwise negative.   Objective:  Physical Exam BP 102/68     Pulse 70    Ht 5\' 7"  (1.702 m)    Wt 230 lb 11.2 oz (104.6 kg)    LMP 05/26/2019 (Approximate)    BMI 36.13 kg/m  CONSTITUTIONAL: Well-developed, well-nourished female in no acute distress.  HENT:  Normocephalic, atraumatic. External right and left ear normal. Oropharynx is clear and moist EYES: Conjunctivae and EOM are normal. Pupils are equal, round, and reactive to light. No scleral icterus.  NECK: Normal range of motion, supple, no masses SKIN: Skin is warm and dry. No rash noted. Not diaphoretic. No erythema. No pallor. NEUROLOGIC: Alert and oriented to person, place, and time. Normal reflexes, muscle tone coordination. No cranial nerve deficit noted. PSYCHIATRIC: Normal mood and affect. Normal behavior. Normal judgment and thought content. CARDIOVASCULAR: Normal heart rate noted RESPIRATORY: Effort normal, no problems with respiration noted ABDOMEN: Soft, no distention noted.   PELVIC: deferred MUSCULOSKELETAL: Normal range of motion. No edema noted.  Labs and Imaging Ct Renal Stone Study  Result Date: 06/16/2019 CLINICAL DATA:  Left lower quadrant pain EXAM: CT ABDOMEN AND PELVIS WITHOUT CONTRAST TECHNIQUE: Multidetector CT imaging of the abdomen and pelvis was performed following the standard protocol without IV contrast. COMPARISON:  None. FINDINGS: Lower chest: Lung bases are clear. No effusions. Heart is normal size. Hepatobiliary: No focal hepatic abnormality. Gallbladder unremarkable. Pancreas: No focal abnormality or ductal dilatation. Spleen: No focal abnormality.  Normal size. Adrenals/Urinary Tract: Punctate  nonobstructing stone in the upper pole of the right kidney. No renal stone on the left. No ureteral stones. No hydronephrosis. Adrenal glands and urinary bladder unremarkable. Stomach/Bowel: Normal appendix. Postoperative changes in the stomach. Stomach, large and small bowel grossly unremarkable. Vascular/Lymphatic: No evidence of aneurysm or adenopathy. Reproductive: IUD is  in place within the uterus. This is located in the posterior myometrium, extending external to the wall of the uterus. No adnexal mass. Other: No free fluid or free air. Musculoskeletal: No acute bony abnormality. IMPRESSION: Punctate right upper pole nephrolithiasis. No ureteral stones or hydronephrosis bilaterally. IUD extends through the posterior myometrium with the arms external to the uterus. Normal appendix. Electronically Signed   By: Rolm Baptise M.D.   On: 06/16/2019 22:17    Assessment & Plan:   1. Malpositioned intrauterine device (IUD), subsequent encounter IUD with arms appearing to be external to uterus on CT - reviewed with patient need for removal - reviewed possibility of removal vaginally with strings (noted on pelvic exam in Ed) but possibility of breakage or not being able to remove vaginally, reviewed possible need for surgery to remove - patient desires removal ASAP - have discussed case with in house attending, Dr. Hulan Fray, who will arrange for removal in OR this week  2. Pelvic pain Reviewed IUD is likely not cause of acute pelvic pain, this has likely been malpositioned since placement, especially as her pain has been improving   Routine preventative health maintenance measures emphasized. Please refer to After Visit Summary for other counseling recommendations.   Return if symptoms worsen or fail to improve.  Total face-to-face time with patient: 20 minutes. Over 50% of encounter was spent on counseling and coordination of care.  Feliz Beam, M.D. Attending Center for [employer])

## 2019-06-18 NOTE — ED Notes (Signed)
Duplicate triage entered for same encounter but different facility. Pt discharge out and new encounter created.

## 2019-06-18 NOTE — ED Provider Notes (Signed)
[facility] EMERGENCY DEPARTMENT Provider Note   CSN: [id number] Arrival date & time: 06/18/19  1958     History   Chief Complaint Chief Complaint  Patient presents with  . Facial Pain    HPI Tammy Andrews is a 28 y.o. female.     The history is provided by the patient and medical records. No language interpreter was used.  Muscle Pain This is a new problem. The current episode started 1 to 2 hours ago. The problem occurs constantly. The problem has not changed since onset.Associated symptoms include abdominal pain (improved from yesterday). Pertinent negatives include no chest pain, no headaches and no shortness of breath. Nothing (haldol) aggravates the symptoms. Nothing relieves the symptoms. She has tried nothing for the symptoms. The treatment provided no relief.    Past Medical History:  Diagnosis Date  . Anemia   . Anxiety   . Asthma    childhood  . Depression   . Hyperemesis gravidarum before end of [redacted] week gestation with dehydration 07/27/2018   16% wt loss by 19 weeks.  . Hypertension    PIH  . Pregnancy affected by fetal growth restriction 01/14/2019  . Preterm labor     Patient Active Problem List   Diagnosis Date Noted  . Presence of IUD 06/16/2019  . Chronic hypertension 02/13/2019  . Fetal demise in singleton pregnancy greater than [redacted] weeks gestation, antepartum 09/13/2018  . Marijuana abuse 08/09/2018  . H/O bariatric surgery 08/09/2018    Past Surgical History:  Procedure Laterality Date  . DILATION AND CURETTAGE OF UTERUS    . LAPAROSCOPIC GASTRIC SLEEVE RESECTION  08/2014     OB History    Gravida  3   Para  2   Term  0   Preterm  2   AB  1   Living  1     SAB  0   TAB  1   Ectopic  0   Multiple  0   Live Births  1        Obstetric Comments  G2: per pt, approx 24wks, woke with bleeding (no pain) and dx with IUFD when went to hospital. IOL SVD         Home Medications    Prior to Admission  medications   Medication Sig Start Date End Date Taking? Authorizing Provider  acetaminophen (TYLENOL) 500 MG tablet Take 1,000 mg by mouth every 6 (six) hours as needed for moderate pain.    [provider]  haloperidol (HALDOL) 5 MG tablet Take 1 tablet (5 mg total) by mouth every 6 (six) hours as needed (nausea or vomiting). 06/17/19   Dione Booze, MD  naproxen (NAPROSYN) 500 MG tablet Take 1 tablet (500 mg total) by mouth 2 (two) times daily. 06/17/19   Dione Booze, MD  oxyCODONE-acetaminophen (PERCOCET) 5-325 MG tablet Take 1 tablet by mouth every 4 (four) hours as needed for moderate pain. 06/17/19   Dione Booze, MD    Family History Family History  Problem Relation Age of Onset  . Hypertension Father   . Diabetes Paternal Grandmother     Social History Social History   Tobacco Use  . Smoking status: Never Smoker  . Smokeless tobacco: Never Used  Substance Use Topics  . Alcohol use: Not Currently    Comment: not in pregnancy  . Drug use: Yes    Types: Marijuana    Comment: last used 12/17/18     Allergies   Pork-derived products  Review of Systems Review of Systems  Constitutional: Negative for chills.  HENT: Negative for congestion, drooling, facial swelling (facial twitching on right side), hearing loss, mouth sores, rhinorrhea, sinus pain, sore throat, trouble swallowing and voice change.   Eyes: Negative for photophobia, pain and visual disturbance.  Respiratory: Negative for cough, chest tightness, shortness of breath and wheezing.   Cardiovascular: Negative for chest pain.  Gastrointestinal: Positive for abdominal pain (improved from yesterday). Negative for constipation, diarrhea, nausea and vomiting.  Genitourinary: Negative for flank pain.  Musculoskeletal: Negative for back pain.  Skin: Negative for wound.  Neurological: Positive for tremors (of R faceial muscles). Negative for dizziness, facial asymmetry, weakness, light-headedness, numbness  and headaches.  All other systems reviewed and are negative.    Physical Exam Updated Vital Signs BP (!) 156/103   Pulse 86   Temp 98.6 F (37 C)   Resp 19   LMP 05/26/2019 (Approximate)   SpO2 99%   Physical Exam Vitals signs and nursing note reviewed.  Constitutional:      General: She is not in acute distress.    Appearance: She is well-developed. She is not ill-appearing, toxic-appearing or diaphoretic.  HENT:     Head: Normocephalic and atraumatic.      Right Ear: External ear normal.     Left Ear: External ear normal.     Nose: Nose normal.     Mouth/Throat:     Mouth: Mucous membranes are moist.     Pharynx: Oropharynx is clear. No oropharyngeal exudate or posterior oropharyngeal erythema.  Eyes:     Extraocular Movements: Extraocular movements intact.     Conjunctiva/sclera: Conjunctivae normal.     Pupils: Pupils are equal, round, and reactive to light.  Neck:     Musculoskeletal: Normal range of motion and neck supple. No muscular tenderness.  Cardiovascular:     Rate and Rhythm: Normal rate.  Pulmonary:     Effort: Pulmonary effort is normal. No respiratory distress.     Breath sounds: Normal breath sounds. No stridor. No wheezing, rhonchi or rales.  Chest:     Chest wall: No tenderness.  Abdominal:     General: Abdomen is flat. There is no distension.     Tenderness: There is no abdominal tenderness. There is no rebound.  Musculoskeletal:        General: No tenderness.  Skin:    General: Skin is warm.     Capillary Refill: Capillary refill takes less than 2 seconds.     Coloration: Skin is not pale.     Findings: No erythema or rash.  Neurological:     General: No focal deficit present.     Mental Status: She is alert and oriented to person, place, and time.     Cranial Nerves: No cranial nerve deficit.     Sensory: No sensory deficit.     Motor: No weakness or abnormal muscle tone.     Coordination: Coordination normal.     Deep Tendon Reflexes:  Reflexes are normal and symmetric.  Psychiatric:        Mood and Affect: Mood normal.      ED Treatments / Results  Labs (all labs ordered are listed, but only abnormal results are displayed) Labs Reviewed - No data to display  EKG None  Radiology No results found.  Procedures Procedures (including critical care time)  Medications Ordered in ED Medications  diphenhydrAMINE (BENADRYL) injection 50 mg (50 mg Intravenous Given 06/18/19 2024)  Initial Impression / Assessment and Plan / ED Course  I have reviewed the triage vital signs and the nursing notes.  Pertinent labs & imaging results that were available during my care of the patient were reviewed by me and considered in my medical decision making (see chart for details).        Tammy Andrews is a 28 y.o. female with past medical history significant for hypertension, prior bariatric surgery, and abdominal pain found yesterday to be due to IUD abnormality who presents with facial twitching and pain.  Patient reports that for the last few hours, she has had right-sided facial contractures, pain, and twitching.  She reports never had this before.  She does report that she started Haldol and pain medicine yesterday for the nausea and pain from her IUD that is migrated causing pain.  She is scheduled to follow-up with OB/GYN for this.  She reports that her abdomen is doing very well but after taking the Haldol today, she has been having the facial contractures and twitching.  She denies any allergies to medications.  On exam, patient has symmetric smile but she is having some muscle spasm firmness on her right jaw/cheek area.  No stridor and lungs were clear.  No difficulty swallowing or breathing.  No evidence of PTA or RPA.  Normal neck range of motion.  Normal sensation of the face.  Normal extraocular movements.  No focal neurologic deficits.  Clinical aspect patient has akathisia and acute extrapyramidal symptoms due to  the Haldol.  Had a conversation with patient about this and she is willing to try IV Benadryl to see if this helps.  If this fails, will consider benztropine IV.  We will likely tell her to avoid the Haldol and have her follow-up with her PCP/OB/GYN for further management if this helps her.  Low suspicion for stroke or other acute neurologic cause of the facial twitching and symptoms.  Anticipate discharge if she is feeling better after medications.  10:15 PM Patient reports complete resolution of her facial symptoms after the Benadryl.  We feel this likely confirmed the symptoms are related to the Haldol.  Given improvement in symptoms, we feel she safe for discharge home.  She was able to eat and drink without difficulty.  No other concerning findings.  Patient will take Benadryl and will follow-up with PCP.  She will stop the Haldol.  Patient agreed with this plan as well as return precautions.  She no other questions or concerns and was discharged in good condition.  Final Clinical Impressions(s) / ED Diagnoses   Final diagnoses:  Adverse effect of haloperidol, initial encounter  Extrapyramidal symptom  Muscle spasm  Dystonic drug reaction      Clinical Impression: 1. Adverse effect of haloperidol, initial encounter   2. Extrapyramidal symptom   3. Muscle spasm   4. Dystonic drug reaction     Disposition: Discharge  Condition: Good  I have discussed the results, Dx and Tx plan with the pt(& family if present). He/she/they expressed understanding and agree(s) with the plan. Discharge instructions discussed at great length. Strict return precautions discussed and pt &/or family have verbalized understanding of the instructions. No further questions at time of discharge.    Current Discharge Medication List      Follow Up: [facility] [place] [facility] [address] [id number] [place] [id number]  [phone] Schedule an appointment as soon as possible for a visit       Diante Barley, Canary Brim, MD 06/18/19 2221

## 2019-06-18 NOTE — ED Notes (Signed)
Pt up to triage desk stating her mouth is hurting worse, tearful. Pt advised she must continue to wait but will get back to a room as soon as we can.

## 2019-06-18 NOTE — Telephone Encounter (Signed)
Called patient to inform her of up her upcoming surgery date/time.    She is posted for 06/20/19 at 9:30am with Dr. Hulan Fray at [facility] for hysteroscopy removal of IUD only.  Patient desires permanent sterilization but wishes to wait until her tubal papers are signed and 54 days old so that Medicaid will cover the procedure.   Patient was instructed to arrive at 8:00am.  She was instructed to go to [place] now today for COVID testing and to self quarantine until surgery date.   Patient requested note for work.  Letter will be put in Epic and patient will print out for employer.

## 2019-06-18 NOTE — ED Triage Notes (Signed)
Pt here for evaluation of swelling to L face onset today-pt sts she woke up from a nap and was facetiming her husband who noticed her face didn't look right. Denies pain, shob, or any contact with allergens. Airway intact, pt talking in complete sentences.

## 2019-06-18 NOTE — Discharge Instructions (Signed)
Your history and exam today are consistent with a dystonic reaction to the Haldol causing extrapyramidal symptoms such as the muscle spasm and discomfort in the face.  Your exam was otherwise completely reassuring.  As we felt this was the cause, we gave you Benadryl which reversed the symptoms.  Please do not take the Haldol and you may take Benadryl for the next day.  Please rest and stay hydrated.  Please follow-up with your primary doctor and OB/GYN as you are.  If any symptoms change or worsen, please return to the nearest emergency department.

## 2019-06-18 NOTE — ED Triage Notes (Signed)
Pt presents via [facility] with the c/o facial stiffness, swelling and increased saliva. Pt was seen at [facility] and [facility]. Pt seen on Nov. 30 for IUD pain and prescribed oxycodone and haldol. Pt tearful during assessment.

## 2019-06-19 ENCOUNTER — Encounter: Payer: Self-pay | Admitting: Obstetrics and Gynecology

## 2019-06-19 ENCOUNTER — Other Ambulatory Visit (HOSPITAL_COMMUNITY)
Admission: RE | Admit: 2019-06-19 | Discharge: 2019-06-19 | Disposition: A | Discharge status: Home / Self Care | Payer: Medicaid Other | Source: Ambulatory Visit | Attending: Obstetrics & Gynecology | Admitting: Obstetrics & Gynecology

## 2019-06-19 DIAGNOSIS — Z20828 Contact with and (suspected) exposure to other viral communicable diseases: Secondary | ICD-10-CM | POA: Insufficient documentation

## 2019-06-19 DIAGNOSIS — Z01812 Encounter for preprocedural laboratory examination: Secondary | ICD-10-CM | POA: Insufficient documentation

## 2019-06-19 LAB — SARS CORONAVIRUS 2 (TAT 6-24 HRS): SARS Coronavirus 2: NEGATIVE

## 2019-06-19 NOTE — Progress Notes (Signed)
Spoke with patient about getting covid test today, it was imperative that she had it done before 11am to ensure that her surgery would not be cancelled for tomorrow. Patient states that she will go today at 10am for covid test.

## 2019-06-20 ENCOUNTER — Ambulatory Visit (HOSPITAL_BASED_OUTPATIENT_CLINIC_OR_DEPARTMENT_OTHER)
Admission: RE | Admit: 2019-06-20 | Discharge: 2019-06-20 | Disposition: A | Discharge status: Home / Self Care | Payer: Medicaid Other | Attending: Obstetrics & Gynecology | Admitting: Obstetrics & Gynecology

## 2019-06-20 ENCOUNTER — Ambulatory Visit (HOSPITAL_BASED_OUTPATIENT_CLINIC_OR_DEPARTMENT_OTHER): Payer: Medicaid Other | Admitting: Certified Registered"

## 2019-06-20 ENCOUNTER — Encounter (HOSPITAL_BASED_OUTPATIENT_CLINIC_OR_DEPARTMENT_OTHER)
Admission: RE | Disposition: A | Discharge status: Home / Self Care | Payer: Self-pay | Source: Home / Self Care | Attending: Obstetrics & Gynecology

## 2019-06-20 ENCOUNTER — Encounter (HOSPITAL_BASED_OUTPATIENT_CLINIC_OR_DEPARTMENT_OTHER): Payer: Self-pay | Admitting: *Deleted

## 2019-06-20 ENCOUNTER — Other Ambulatory Visit: Payer: Self-pay

## 2019-06-20 DIAGNOSIS — T8332XA Displacement of intrauterine contraceptive device, initial encounter: Secondary | ICD-10-CM | POA: Diagnosis not present

## 2019-06-20 DIAGNOSIS — R102 Pelvic and perineal pain: Secondary | ICD-10-CM | POA: Insufficient documentation

## 2019-06-20 DIAGNOSIS — X58XXXA Exposure to other specified factors, initial encounter: Secondary | ICD-10-CM | POA: Diagnosis not present

## 2019-06-20 DIAGNOSIS — Z30432 Encounter for removal of intrauterine contraceptive device: Secondary | ICD-10-CM

## 2019-06-20 DIAGNOSIS — T8389XA Other specified complication of genitourinary prosthetic devices, implants and grafts, initial encounter: Secondary | ICD-10-CM

## 2019-06-20 DIAGNOSIS — I10 Essential (primary) hypertension: Secondary | ICD-10-CM | POA: Insufficient documentation

## 2019-06-20 HISTORY — PX: IUD REMOVAL: SHX5392

## 2019-06-20 LAB — POCT PREGNANCY, URINE: Preg Test, Ur: NEGATIVE

## 2019-06-20 SURGERY — REMOVAL, INTRAUTERINE DEVICE
Anesthesia: General | Site: Vagina

## 2019-06-20 MED ORDER — ONDANSETRON HCL 4 MG/2ML IJ SOLN: 4.0000 mg | Freq: Once | INTRAMUSCULAR | Status: DC | PRN

## 2019-06-20 MED ORDER — LIDOCAINE 2% (20 MG/ML) 5 ML SYRINGE
INTRAMUSCULAR | Status: DC | PRN
  Administered 2019-06-20: 60 mg via INTRAVENOUS

## 2019-06-20 MED ORDER — MIDAZOLAM HCL 5 MG/5ML IJ SOLN
INTRAMUSCULAR | Status: DC | PRN
  Administered 2019-06-20: 2 mg via INTRAVENOUS

## 2019-06-20 MED ORDER — MEPERIDINE HCL 25 MG/ML IJ SOLN: 6.2500 mg | INTRAMUSCULAR | Status: DC | PRN

## 2019-06-20 MED ORDER — LABETALOL HCL 5 MG/ML IV SOLN
10.0000 mg | INTRAVENOUS | Status: DC | PRN
  Administered 2019-06-20: 10 mg via INTRAVENOUS

## 2019-06-20 MED ORDER — PROPOFOL 10 MG/ML IV BOLUS
INTRAVENOUS | Status: DC | PRN
  Administered 2019-06-20: 200 mg via INTRAVENOUS

## 2019-06-20 MED ORDER — LACTATED RINGERS IV SOLN
INTRAVENOUS | Status: DC
  Administered 2019-06-20 (×2): via INTRAVENOUS

## 2019-06-20 MED ORDER — LABETALOL HCL 5 MG/ML IV SOLN
INTRAVENOUS | Status: AC
  Filled 2019-06-20: qty 4

## 2019-06-20 MED ORDER — DIPHENHYDRAMINE HCL 50 MG/ML IJ SOLN
INTRAMUSCULAR | Status: DC | PRN
  Administered 2019-06-20: 12.5 mg via INTRAVENOUS

## 2019-06-20 MED ORDER — LIDOCAINE 2% (20 MG/ML) 5 ML SYRINGE
INTRAMUSCULAR | Status: AC
  Filled 2019-06-20: qty 5

## 2019-06-20 MED ORDER — MEDROXYPROGESTERONE ACETATE 150 MG/ML IM SUSP: 150.0000 mg | Freq: Once | INTRAMUSCULAR | Status: DC

## 2019-06-20 MED ORDER — ROCURONIUM BROMIDE 10 MG/ML (PF) SYRINGE
PREFILLED_SYRINGE | INTRAVENOUS | Status: AC
  Filled 2019-06-20: qty 10

## 2019-06-20 MED ORDER — MIDAZOLAM HCL 2 MG/2ML IJ SOLN
INTRAMUSCULAR | Status: AC
  Filled 2019-06-20: qty 2

## 2019-06-20 MED ORDER — SILVER NITRATE-POT NITRATE 75-25 % EX MISC
CUTANEOUS | Status: DC | PRN
  Administered 2019-06-20: 2 via TOPICAL

## 2019-06-20 MED ORDER — DEXAMETHASONE SODIUM PHOSPHATE 10 MG/ML IJ SOLN
INTRAMUSCULAR | Status: DC | PRN
  Administered 2019-06-20: 10 mg via INTRAVENOUS

## 2019-06-20 MED ORDER — KETOROLAC TROMETHAMINE 30 MG/ML IJ SOLN
INTRAMUSCULAR | Status: DC | PRN
  Administered 2019-06-20: 30 mg via INTRAVENOUS

## 2019-06-20 MED ORDER — FENTANYL CITRATE (PF) 100 MCG/2ML IJ SOLN
INTRAMUSCULAR | Status: DC | PRN
  Administered 2019-06-20 (×2): 50 ug via INTRAVENOUS

## 2019-06-20 MED ORDER — BUPIVACAINE HCL (PF) 0.5 % IJ SOLN
INTRAMUSCULAR | Status: DC | PRN
  Administered 2019-06-20: 30 mL

## 2019-06-20 MED ORDER — HYDROMORPHONE HCL 1 MG/ML IJ SOLN: 0.2500 mg | INTRAMUSCULAR | Status: DC | PRN

## 2019-06-20 MED ORDER — PROPOFOL 10 MG/ML IV BOLUS
INTRAVENOUS | Status: AC
  Filled 2019-06-20: qty 20

## 2019-06-20 MED ORDER — FENTANYL CITRATE (PF) 100 MCG/2ML IJ SOLN
INTRAMUSCULAR | Status: AC
  Filled 2019-06-20: qty 2

## 2019-06-20 SURGICAL SUPPLY — 22 items
BRIEF STRETCH FOR OB PAD XXL (UNDERPADS AND DIAPERS) IMPLANT
CANISTER SUCT 3000ML PPV (MISCELLANEOUS) ×3 IMPLANT
CLOTH BEACON ORANGE TIMEOUT ST (SAFETY) IMPLANT
CONTAINER PREFILL 10% NBF 60ML (FORM) IMPLANT
DILATOR CANAL MILEX (MISCELLANEOUS) IMPLANT
ELECT REM PT RETURN 9FT ADLT (ELECTROSURGICAL)
ELECTRODE REM PT RTRN 9FT ADLT (ELECTROSURGICAL) IMPLANT
GAUZE 4X4 16PLY RFD (DISPOSABLE) ×3 IMPLANT
GLOVE BIO SURGEON STRL SZ 6.5 (GLOVE) ×3 IMPLANT
GLOVE BIOGEL PI IND STRL 7.0 (GLOVE) ×2 IMPLANT
GLOVE BIOGEL PI INDICATOR 7.0 (GLOVE) ×1
GLOVE ECLIPSE 6.5 STRL STRAW (GLOVE) ×3 IMPLANT
GOWN STRL REUS W/ TWL XL LVL3 (GOWN DISPOSABLE) ×2 IMPLANT
GOWN STRL REUS W/TWL LRG LVL3 (GOWN DISPOSABLE) ×3 IMPLANT
GOWN STRL REUS W/TWL XL LVL3 (GOWN DISPOSABLE) ×1
KIT PROCEDURE FLUENT (KITS) IMPLANT
PACK VAGINAL MINOR WOMEN LF (CUSTOM PROCEDURE TRAY) ×3 IMPLANT
PAD OB MATERNITY 4.3X12.25 (PERSONAL CARE ITEMS) ×3 IMPLANT
PAD PREP 24X48 CUFFED NSTRL (MISCELLANEOUS) ×3 IMPLANT
SLEEVE SCD COMPRESS KNEE MED (MISCELLANEOUS) ×3 IMPLANT
TOWEL GREEN STERILE FF (TOWEL DISPOSABLE) ×6 IMPLANT
UNDERPAD 30X36 HEAVY ABSORB (UNDERPADS AND DIAPERS) ×3 IMPLANT

## 2019-06-20 NOTE — Anesthesia Preprocedure Evaluation (Signed)
Anesthesia Evaluation  Patient identified by MRN, date of birth, ID band Patient awake    Reviewed: Allergy & Precautions, NPO status , Patient's Chart, lab work & pertinent test results  Airway Mallampati: I  TM Distance: >3 FB Neck ROM: Full    Dental   Pulmonary asthma ,    Pulmonary exam normal        Cardiovascular hypertension, Pt. on medications Normal cardiovascular exam     Neuro/Psych Anxiety Depression    GI/Hepatic   Endo/Other    Renal/GU      Musculoskeletal   Abdominal   Peds  Hematology   Anesthesia Other Findings   Reproductive/Obstetrics                             Anesthesia Physical Anesthesia Plan  ASA: II  Anesthesia Plan: General   Post-op Pain Management:    Induction:   PONV Risk Score and Plan: 3 and Midazolam, Dexamethasone and Ondansetron  Airway Management Planned: LMA  Additional Equipment:   Intra-op Plan:   Post-operative Plan: Extubation in OR  Informed Consent: I have reviewed the patients History and Physical, chart, labs and discussed the procedure including the risks, benefits and alternatives for the proposed anesthesia with the patient or authorized representative who has indicated his/her understanding and acceptance.       Plan Discussed with: CRNA and Surgeon  Anesthesia Plan Comments:         Anesthesia Quick Evaluation

## 2019-06-20 NOTE — Anesthesia Postprocedure Evaluation (Signed)
Anesthesia Post Note  Patient: Product manager  Procedure(s) Performed: INTRAUTERINE DEVICE (IUD) REMOVAL (N/A Vagina )     Patient location during evaluation: PACU Anesthesia Type: General Level of consciousness: awake and alert Pain management: pain level controlled Vital Signs Assessment: post-procedure vital signs reviewed and stable Respiratory status: spontaneous breathing, nonlabored ventilation, respiratory function stable and patient connected to nasal cannula oxygen Cardiovascular status: blood pressure returned to baseline and stable Postop Assessment: no apparent nausea or vomiting Anesthetic complications: no    Last Vitals:  Vitals:   06/20/19 1039 06/20/19 1100  BP: 138/87 (!) 160/96  Pulse: 81   Resp: 15 16  Temp:  36.8 C  SpO2: 100% 100%    Last Pain:  Vitals:   06/20/19 1100  TempSrc:   PainSc: 2                  Karder Goodin DAVID

## 2019-06-20 NOTE — H&P (Signed)
Tammy Andrews is an 28 y.o. P1 here for removal of a malpositioned IUD, placed 7/20. She has been experiencing a lot of pelvic pain and the only abnormal finding on CT was the IUD position. She wants a BTL but has not signed her medicaid forms. She plans to get depo provera until she can get her BTL done.   Patient's last menstrual period was 05/26/2019 (approximate).    Past Medical History:  Diagnosis Date  . Anemia   . Anxiety   . Asthma    childhood  . Depression   . Hyperemesis gravidarum before end of [redacted] week gestation with dehydration 07/27/2018   16% wt loss by 19 weeks.  . Hypertension    PIH  . Pregnancy affected by fetal growth restriction 01/14/2019  . Preterm labor     Past Surgical History:  Procedure Laterality Date  . DILATION AND CURETTAGE OF UTERUS    . LAPAROSCOPIC GASTRIC SLEEVE RESECTION  08/2014    Family History  Problem Relation Age of Onset  . Hypertension Father   . Diabetes Paternal Grandmother     Social History:  reports that she has never smoked. She has never used smokeless tobacco. She reports current alcohol use of about 1.0 standard drinks of alcohol per week. She reports current drug use. Drug: Marijuana.  Allergies:  Allergies  Allergen Reactions  . Haldol [Haloperidol Lactate]     Dystonic reaction of face with acute extrapyramidal symptoms.   . Pork-Derived Products     Medications Prior to Admission  Medication Sig Dispense Refill Last Dose  . acetaminophen (TYLENOL) 500 MG tablet Take 1,000 mg by mouth every 6 (six) hours as needed for moderate pain.   Past Week at Unknown time  . naproxen (NAPROSYN) 500 MG tablet Take 1 tablet (500 mg total) by mouth 2 (two) times daily. 30 tablet 0 Past Week at Unknown time  . oxyCODONE-acetaminophen (PERCOCET) 5-325 MG tablet Take 1 tablet by mouth every 4 (four) hours as needed for moderate pain. 10 tablet 0 06/19/2019 at Unknown time    ROS  Blood pressure 132/87, pulse 72, temperature  (!) 97.3 F (36.3 C), temperature source Oral, resp. rate 16, height 5\' 7"  (1.702 m), weight 106 kg, last menstrual period 05/26/2019, SpO2 100 %, not currently breastfeeding. Physical Exam  Breathing, conversing, and ambulating normally Well nourished, well hydrated Black female, no apparent distress Heart- rrr Lungs- CTAB Abd- benign  Results for orders placed or performed during the hospital encounter of 06/20/19 (from the past 24 hour(s))  Pregnancy, urine POC     Status: None   Collection Time: 06/20/19  8:51 AM  Result Value Ref Range   Preg Test, Ur NEGATIVE NEGATIVE    No results found.  Assessment/Plan: Malpositioned IUD with pelvic pain- plan for hysteroscopy and removal of IUD. If I laparoscopy is required, I have consented her for this as well.  She understands the risks of surgery, including, but not to infection, bleeding, DVTs, damage to bowel, bladder, ureters. She wishes to proceed.     Emily Filbert 06/20/2019, 9:17 AM

## 2019-06-20 NOTE — Transfer of Care (Signed)
Immediate Anesthesia Transfer of Care Note  Patient: Product manager  Procedure(s) Performed: INTRAUTERINE DEVICE (IUD) REMOVAL (N/A Vagina )  Patient Location: PACU  Anesthesia Type:General  Level of Consciousness: drowsy  Airway & Oxygen Therapy: Patient Spontanous Breathing and Patient connected to face mask oxygen  Post-op Assessment: Report given to RN and Post -op Vital signs reviewed and stable  Post vital signs: Reviewed and stable  Last Vitals:  Vitals Value Taken Time  BP 165/108 06/20/19 1010  Temp    Pulse 79 06/20/19 1013  Resp 14 06/20/19 1013  SpO2 100 % 06/20/19 1013  Vitals shown include unvalidated device data.  Last Pain:  Vitals:   06/20/19 0914  TempSrc: Oral  PainSc: 0-No pain      Patients Stated Pain Goal: 3 (50/35/46 5681)  Complications: No apparent anesthesia complications

## 2019-06-20 NOTE — Op Note (Signed)
06/20/2019  10:21 AM  PATIENT:  Tammy Andrews  28 y.o. female  PRE-OPERATIVE DIAGNOSIS:  embedded trapped iud  POST-OPERATIVE DIAGNOSIS:  embedded trapped iud  PROCEDURE:  Procedure(s): INTRAUTERINE DEVICE (IUD) REMOVAL (N/A)  SURGEON:  Surgeon(s) and Role:    * Chanique Duca C, MD - Primary   ANESTHESIA:   local and MAC  EBL:  2 mL   BLOOD ADMINISTERED:none  DRAINS: none   LOCAL MEDICATIONS USED:  MARCAINE     SPECIMEN:  none  DISPOSITION OF SPECIMEN:  none  COUNTS:  YES  TOURNIQUET:  * No tourniquets in log *  DICTATION: .Dragon Dictation  PLAN OF CARE: Discharge to home after PACU  PATIENT DISPOSITION:  PACU - hemodynamically stable.   Delay start of Pharmacological VTE agent (>24hrs) due to surgical blood loss or risk of bleeding: not applicable   The risks, benefits, and alternatives of surgery were explained, understood, and accepted. All questions were answered. Consents were signed. In the operating room MAC anesthesia was applied without complication, and she was placed in the dorsal lithotomy position. Her vagina was prepped and draped in the usual fashion. I injected 30 cc of 0.5% marcaine for a paracervical block. I grasped the IUD strings and pulled gently. The intact IUD came out easily. There was no bleeding noted at the end of the case. She was taken to the recovery room after being extubated. She tolerated the procedure well.

## 2019-06-20 NOTE — Discharge Instructions (Signed)

## 2019-06-20 NOTE — Anesthesia Procedure Notes (Signed)
Procedure Name: LMA Insertion Date/Time: 06/20/2019 9:45 AM Performed by: Gwyndolyn Saxon, CRNA Pre-anesthesia Checklist: Patient identified, Emergency Drugs available, Suction available and Patient being monitored Patient Re-evaluated:Patient Re-evaluated prior to induction Oxygen Delivery Method: Circle system utilized Preoxygenation: Pre-oxygenation with 100% oxygen Induction Type: IV induction Ventilation: Mask ventilation without difficulty LMA: LMA inserted LMA Size: 4.0 Number of attempts: 1 Placement Confirmation: positive ETCO2 and breath sounds checked- equal and bilateral Tube secured with: Tape Dental Injury: Teeth and Oropharynx as per pre-operative assessment

## 2019-06-21 ENCOUNTER — Encounter (HOSPITAL_BASED_OUTPATIENT_CLINIC_OR_DEPARTMENT_OTHER): Payer: Self-pay | Admitting: Obstetrics & Gynecology

## 2020-04-12 ENCOUNTER — Other Ambulatory Visit: Payer: Self-pay

## 2020-04-12 ENCOUNTER — Encounter (HOSPITAL_BASED_OUTPATIENT_CLINIC_OR_DEPARTMENT_OTHER): Payer: Self-pay | Admitting: Emergency Medicine

## 2020-04-12 ENCOUNTER — Emergency Department (HOSPITAL_BASED_OUTPATIENT_CLINIC_OR_DEPARTMENT_OTHER)
Admission: EM | Admit: 2020-04-12 | Discharge: 2020-04-13 | Disposition: A | Discharge status: Home / Self Care | Payer: Managed Care, Other (non HMO) | Attending: Emergency Medicine | Admitting: Emergency Medicine

## 2020-04-12 DIAGNOSIS — R1110 Vomiting, unspecified: Secondary | ICD-10-CM | POA: Diagnosis not present

## 2020-04-12 DIAGNOSIS — R1084 Generalized abdominal pain: Secondary | ICD-10-CM

## 2020-04-12 DIAGNOSIS — I10 Essential (primary) hypertension: Secondary | ICD-10-CM | POA: Insufficient documentation

## 2020-04-12 DIAGNOSIS — J45909 Unspecified asthma, uncomplicated: Secondary | ICD-10-CM | POA: Diagnosis not present

## 2020-04-12 DIAGNOSIS — N3000 Acute cystitis without hematuria: Secondary | ICD-10-CM | POA: Diagnosis not present

## 2020-04-12 DIAGNOSIS — R109 Unspecified abdominal pain: Secondary | ICD-10-CM | POA: Diagnosis present

## 2020-04-12 MED ORDER — ONDANSETRON HCL 4 MG/2ML IJ SOLN
4.0000 mg | Freq: Once | INTRAMUSCULAR | Status: AC
  Administered 2020-04-13: 4 mg via INTRAVENOUS
  Filled 2020-04-12: qty 2

## 2020-04-12 MED ORDER — SODIUM CHLORIDE 0.9 % IV BOLUS
1000.0000 mL | Freq: Once | INTRAVENOUS | Status: AC
  Administered 2020-04-13: 1000 mL via INTRAVENOUS

## 2020-04-12 NOTE — ED Triage Notes (Signed)
Pt is c/o abd pain x 2 days with vomiting  Denies any diarrhea  Pt also c/o headache

## 2020-04-13 ENCOUNTER — Telehealth (HOSPITAL_BASED_OUTPATIENT_CLINIC_OR_DEPARTMENT_OTHER): Payer: Self-pay | Admitting: Emergency Medicine

## 2020-04-13 ENCOUNTER — Emergency Department (HOSPITAL_BASED_OUTPATIENT_CLINIC_OR_DEPARTMENT_OTHER): Payer: Managed Care, Other (non HMO)

## 2020-04-13 ENCOUNTER — Encounter (HOSPITAL_BASED_OUTPATIENT_CLINIC_OR_DEPARTMENT_OTHER): Payer: Self-pay

## 2020-04-13 DIAGNOSIS — N3000 Acute cystitis without hematuria: Secondary | ICD-10-CM | POA: Diagnosis not present

## 2020-04-13 LAB — CBC WITH DIFFERENTIAL/PLATELET
Abs Immature Granulocytes: 0.03 10*3/uL (ref 0.00–0.07)
Basophils Absolute: 0 10*3/uL (ref 0.0–0.1)
Basophils Relative: 0 %
Eosinophils Absolute: 0.1 10*3/uL (ref 0.0–0.5)
Eosinophils Relative: 1 %
HCT: 41.6 % (ref 36.0–46.0)
Hemoglobin: 13.9 g/dL (ref 12.0–15.0)
Immature Granulocytes: 0 %
Lymphocytes Relative: 22 %
Lymphs Abs: 2.2 10*3/uL (ref 0.7–4.0)
MCH: 31.2 pg (ref 26.0–34.0)
MCHC: 33.4 g/dL (ref 30.0–36.0)
MCV: 93.5 fL (ref 80.0–100.0)
Monocytes Absolute: 0.6 10*3/uL (ref 0.1–1.0)
Monocytes Relative: 6 %
Neutro Abs: 7.2 10*3/uL (ref 1.7–7.7)
Neutrophils Relative %: 71 %
Platelets: 275 10*3/uL (ref 150–400)
RBC: 4.45 MIL/uL (ref 3.87–5.11)
RDW: 12.2 % (ref 11.5–15.5)
WBC: 10.1 10*3/uL (ref 4.0–10.5)
nRBC: 0 % (ref 0.0–0.2)

## 2020-04-13 LAB — COMPREHENSIVE METABOLIC PANEL
ALT: 22 U/L (ref 0–44)
AST: 26 U/L (ref 15–41)
Albumin: 4.2 g/dL (ref 3.5–5.0)
Alkaline Phosphatase: 54 U/L (ref 38–126)
Anion gap: 10 (ref 5–15)
BUN: 17 mg/dL (ref 6–20)
CO2: 29 mmol/L (ref 22–32)
Calcium: 9.3 mg/dL (ref 8.9–10.3)
Chloride: 105 mmol/L (ref 98–111)
Creatinine, Ser: 0.86 mg/dL (ref 0.44–1.00)
GFR calc Af Amer: >60 mL/min (ref >60)
GFR calc non Af Amer: >60 mL/min (ref >60)
Glucose, Bld: 110 mg/dL — ABNORMAL HIGH (ref 70–99)
Potassium: 3.7 mmol/L (ref 3.5–5.1)
Sodium: 144 mmol/L (ref 135–145)
Total Bilirubin: 0.6 mg/dL (ref 0.3–1.2)
Total Protein: 8.3 g/dL — ABNORMAL HIGH (ref 6.5–8.1)

## 2020-04-13 LAB — URINALYSIS, ROUTINE W REFLEX MICROSCOPIC
Bilirubin Urine: NEGATIVE
Glucose, UA: NEGATIVE mg/dL
Ketones, ur: NEGATIVE mg/dL
Nitrite: POSITIVE — AB
Protein, ur: 30 mg/dL — AB
Specific Gravity, Urine: >1.03 — ABNORMAL HIGH (ref 1.005–1.030)
pH: 6 (ref 5.0–8.0)

## 2020-04-13 LAB — LIPASE, BLOOD: Lipase: 41 U/L (ref 11–51)

## 2020-04-13 LAB — URINALYSIS, MICROSCOPIC (REFLEX)

## 2020-04-13 LAB — PREGNANCY, URINE: Preg Test, Ur: NEGATIVE

## 2020-04-13 MED ORDER — CEPHALEXIN 500 MG PO CAPS: 500.0000 mg | ORAL_CAPSULE | Freq: Three times a day (TID) | ORAL | 0 refills | Status: DC

## 2020-04-13 MED ORDER — FENTANYL CITRATE (PF) 100 MCG/2ML IJ SOLN
50.0000 ug | Freq: Once | INTRAMUSCULAR | Status: AC
  Administered 2020-04-13: 50 ug via INTRAVENOUS
  Filled 2020-04-13: qty 2

## 2020-04-13 MED ORDER — ONDANSETRON 4 MG PO TBDP: 4.0000 mg | ORAL_TABLET | Freq: Three times a day (TID) | ORAL | 0 refills | Status: DC | PRN

## 2020-04-13 MED ORDER — HYDROCODONE-ACETAMINOPHEN 5-325 MG PO TABS
1.0000 | ORAL_TABLET | Freq: Once | ORAL | Status: AC
  Administered 2020-04-13: 1 via ORAL
  Filled 2020-04-13: qty 1

## 2020-04-13 MED ORDER — SODIUM CHLORIDE 0.9 % IV SOLN
INTRAVENOUS | Status: DC | PRN
  Administered 2020-04-13: 500 mL via INTRAVENOUS

## 2020-04-13 MED ORDER — PROMETHAZINE HCL 25 MG/ML IJ SOLN
25.0000 mg | Freq: Once | INTRAMUSCULAR | Status: AC
  Administered 2020-04-13: 25 mg via INTRAVENOUS

## 2020-04-13 MED ORDER — PROMETHAZINE HCL 25 MG/ML IJ SOLN
INTRAMUSCULAR | Status: AC
  Filled 2020-04-13: qty 1

## 2020-04-13 MED ORDER — IOHEXOL 300 MG/ML  SOLN
100.0000 mL | Freq: Once | INTRAMUSCULAR | Status: AC | PRN
  Administered 2020-04-13: 100 mL via INTRAVENOUS

## 2020-04-13 MED ORDER — SODIUM CHLORIDE 0.9 % IV SOLN
1.0000 g | Freq: Once | INTRAVENOUS | Status: AC
  Administered 2020-04-13: 1 g via INTRAVENOUS
  Filled 2020-04-13: qty 10

## 2020-04-13 NOTE — ED Notes (Signed)
Pt returned from CT.  Pt given ginger ale with instructions to sip on it

## 2020-04-13 NOTE — ED Notes (Signed)
Patient transported to CT 

## 2020-04-13 NOTE — ED Provider Notes (Signed)
[facility] EMERGENCY DEPARTMENT Provider Note   CSN: [id number] Arrival date & time: 04/12/20  2144     History Chief Complaint  Patient presents with  . Abdominal Pain  . Emesis    Tammy Andrews is a 29 y.o. female.  HPI     This is a 29 year old female who presents with abdominal pain and vomiting.  Patient reports several day history of nonbilious, nonbloody emesis and diffuse abdominal cramping.  She rates her pain 8 out of 10.  She is not taken anything for symptoms.  Patient reports normal bowel movements.  No diarrhea.  She has history of gastric sleeve.  Last menstrual period was August 16.  She does not believe herself to be pregnant.  Denies urinary symptoms, fevers.  No known sick contacts or Covid exposures.  Past Medical History:  Diagnosis Date  . Anemia   . Anxiety   . Asthma    childhood  . Depression   . Hyperemesis gravidarum before end of [redacted] week gestation with dehydration 07/27/2018   16% wt loss by 19 weeks.  . Hypertension    PIH  . Pregnancy affected by fetal growth restriction 01/14/2019  . Preterm labor     Patient Active Problem List   Diagnosis Date Noted  . Presence of IUD 06/16/2019  . Chronic hypertension 02/13/2019  . Fetal demise in singleton pregnancy greater than [redacted] weeks gestation, antepartum 09/13/2018  . Marijuana abuse 08/09/2018  . H/O bariatric surgery 08/09/2018    Past Surgical History:  Procedure Laterality Date  . DILATION AND CURETTAGE OF UTERUS    . IUD REMOVAL N/A 06/20/2019   Procedure: INTRAUTERINE DEVICE (IUD) REMOVAL;  Surgeon: Allie Bossier, MD;  Location: [facility];  Service: Gynecology;  Laterality: N/A;  . LAPAROSCOPIC GASTRIC SLEEVE RESECTION  08/2014     OB History    Gravida  3   Para  2   Term  0   Preterm  2   AB  1   Living  1     SAB  0   TAB  1   Ectopic  0   Multiple  0   Live Births  1        Obstetric Comments  G2: per pt, approx 24wks, woke with  bleeding (no pain) and dx with IUFD when went to hospital. IOL SVD        Family History  Problem Relation Age of Onset  . Hypertension Father   . Diabetes Paternal Grandmother     Social History   Tobacco Use  . Smoking status: Never Smoker  . Smokeless tobacco: Never Used  Vaping Use  . Vaping Use: Never used  Substance Use Topics  . Alcohol use: Yes    Alcohol/week: 1.0 standard drink    Types: 1 Glasses of wine per week    Comment: socially  . Drug use: Not Currently    Types: Marijuana    Comment: last used 12/17/18    Home Medications Prior to Admission medications   Medication Sig Start Date End Date Taking? Authorizing Provider  acetaminophen (TYLENOL) 500 MG tablet Take 1,000 mg by mouth every 6 (six) hours as needed for moderate pain.    [provider]  cephALEXin (KEFLEX) 500 MG capsule Take 1 capsule (500 mg total) by mouth 3 (three) times daily. 04/13/20   Bentlee Drier, Mayer Masker, MD  naproxen (NAPROSYN) 500 MG tablet Take 1 tablet (500 mg total) by mouth  2 (two) times daily. 06/17/19   Dione Booze, MD  ondansetron (ZOFRAN ODT) 4 MG disintegrating tablet Take 1 tablet (4 mg total) by mouth every 8 (eight) hours as needed. 04/13/20   Byrdie Miyazaki, Mayer Masker, MD  oxyCODONE-acetaminophen (PERCOCET) 5-325 MG tablet Take 1 tablet by mouth every 4 (four) hours as needed for moderate pain. 06/17/19   Dione Booze, MD  haloperidol (HALDOL) 5 MG tablet Take 1 tablet (5 mg total) by mouth every 6 (six) hours as needed (nausea or vomiting). 06/17/19 06/18/19  Dione Booze, MD    Allergies    Haldol [haloperidol lactate] and Pork-derived products  Review of Systems   Review of Systems  Constitutional: Negative for fever.  Respiratory: Negative for shortness of breath.   Cardiovascular: Negative for chest pain.  Gastrointestinal: Positive for abdominal pain, nausea and vomiting. Negative for diarrhea.  Genitourinary: Negative for dysuria.  All other systems reviewed and  are negative.   Physical Exam Updated Vital Signs BP (!) 134/91 (BP Location: Left Arm)   Pulse 78   Temp 98.8 F (37.1 C) (Oral)   Resp 16   Ht 1.702 m (5\' 7" )   LMP 03/02/2020 (Approximate)   SpO2 100%   BMI 36.60 kg/m   Physical Exam Vitals and nursing note reviewed.  Constitutional:      Appearance: She is well-developed. She is not ill-appearing.  HENT:     Head: Normocephalic and atraumatic.  Eyes:     Pupils: Pupils are equal, round, and reactive to light.  Cardiovascular:     Rate and Rhythm: Normal rate and regular rhythm.     Heart sounds: Normal heart sounds.  Pulmonary:     Effort: Pulmonary effort is normal. No respiratory distress.     Breath sounds: No wheezing.  Abdominal:     General: Bowel sounds are normal.     Palpations: Abdomen is soft.     Tenderness: There is generalized abdominal tenderness. There is no guarding or rebound.  Musculoskeletal:     Cervical back: Neck supple.  Skin:    General: Skin is warm and dry.  Neurological:     Mental Status: She is alert and oriented to person, place, and time.  Psychiatric:        Mood and Affect: Mood normal.     ED Results / Procedures / Treatments   Labs (all labs ordered are listed, but only abnormal results are displayed) Labs Reviewed  COMPREHENSIVE METABOLIC PANEL - Abnormal; Notable for the following components:      Result Value   Glucose, Bld 110 (*)    Total Protein 8.3 (*)    All other components within normal limits  URINALYSIS, ROUTINE W REFLEX MICROSCOPIC - Abnormal; Notable for the following components:   APPearance CLOUDY (*)    Specific Gravity, Urine >1.030 (*)    Hgb urine dipstick TRACE (*)    Protein, ur 30 (*)    Nitrite POSITIVE (*)    Leukocytes,Ua SMALL (*)    All other components within normal limits  URINALYSIS, MICROSCOPIC (REFLEX) - Abnormal; Notable for the following components:   Bacteria, UA MANY (*)    All other components within normal limits  URINE  CULTURE  CBC WITH DIFFERENTIAL/PLATELET  LIPASE, BLOOD  PREGNANCY, URINE    EKG None  Radiology CT ABDOMEN PELVIS W CONTRAST  Result Date: 04/13/2020 CLINICAL DATA:  Acute abdominal pain and vomiting for 2 days. EXAM: CT ABDOMEN AND PELVIS WITH CONTRAST TECHNIQUE: Multidetector CT imaging  of the abdomen and pelvis was performed using the standard protocol following bolus administration of intravenous contrast. CONTRAST:  100mL OMNIPAQUE IOHEXOL 300 MG/ML  SOLN COMPARISON:  06/16/2019 FINDINGS: Lower chest: The lung bases are clear. Hepatobiliary: No focal liver abnormality is seen. No gallstones, gallbladder wall thickening, or biliary dilatation. Pancreas: Unremarkable. No pancreatic ductal dilatation or surrounding inflammatory changes. Spleen: Normal in size without focal abnormality. Adrenals/Urinary Tract: No adrenal gland nodules. 2 mm stone in the upper pole right kidney. Nephrograms are symmetrical. No hydronephrosis or hydroureter. Bladder is decompressed without obvious wall thickening or filling defect. Stomach/Bowel: Postoperative gastric sleeve procedure. Stomach, small bowel, and colon are not abnormally distended. No wall thickening or inflammatory changes are demonstrated. Appendix is normal. Vascular/Lymphatic: No significant vascular findings are present. No enlarged abdominal or pelvic lymph nodes. Reproductive: Uterus and bilateral adnexa are unremarkable. Other: No abdominal wall hernia or abnormality. No abdominopelvic ascites. Musculoskeletal: No acute or significant osseous findings. IMPRESSION: 1. No acute process demonstrated in the abdomen or pelvis. No evidence of bowel obstruction or inflammation. 2. 2 mm nonobstructing stone in the upper pole right kidney. Electronically Signed   By: Burman NievesWilliam  Stevens M.D.   On: 04/13/2020 04:41    Procedures Procedures (including critical care time)  Medications Ordered in ED Medications  0.9 %  sodium chloride infusion ( Intravenous  Stopped 04/13/20 0410)  sodium chloride 0.9 % bolus 1,000 mL (0 mLs Intravenous Stopped 04/13/20 0150)  ondansetron (ZOFRAN) injection 4 mg (4 mg Intravenous Given 04/13/20 0044)  cefTRIAXone (ROCEPHIN) 1 g in sodium chloride 0.9 % 100 mL IVPB (0 g Intravenous Stopped 04/13/20 0344)  fentaNYL (SUBLIMAZE) injection 50 mcg (50 mcg Intravenous Given 04/13/20 0309)  HYDROcodone-acetaminophen (NORCO/VICODIN) 5-325 MG per tablet 1 tablet (1 tablet Oral Given 04/13/20 0440)  promethazine (PHENERGAN) injection 25 mg (25 mg Intravenous Given 04/13/20 0404)  iohexol (OMNIPAQUE) 300 MG/ML solution 100 mL (100 mLs Intravenous Contrast Given 04/13/20 0421)    ED Course  I have reviewed the triage vital signs and the nursing notes.  Pertinent labs & imaging results that were available during my care of the patient were reviewed by me and considered in my medical decision making (see chart for details).    MDM Rules/Calculators/A&P                           Patient presents with abdominal pain and vomiting.  She is overall nontoxic and vital signs are reassuring.  She has tenderness diffusely on exam without signs of peritonitis.  History of gastric sleeve.  Considerations include but not limited to gastroenteritis, obstruction, less likely cholecystitis or appendicitis.  Lab work obtained.  Patient given fluids and medications.  Lab work reviewed without significant leukocytosis, normal LFTs and lipase.  Patient had persistent nausea and pain.  She was redose medications.  Urinalysis was obtained and does show nitrite positive urine that appears infected.  Patient is without urinary symptoms at this time.  We will culture and given symptoms, will treat.  Will treat empirically for Pilo.  Patient was given a dose of IV Rocephin.  Again she had ongoing symptoms out of proportion to exam findings and lab work.  For this reason, abdominal CT was obtained.  Abdominal CT independently reviewed by myself and without any  obvious intra-abdominal process.  No CT findings concerning for pyelonephritis.  Patient was given fluids.  Recommend supportive measures at home including Keflex for UTI and Zofran as  needed for nausea.  Patient stated understanding  After history, exam, and medical workup I feel the patient has been appropriately medically screened and is safe for discharge home. Pertinent diagnoses were discussed with the patient. Patient was given return precautions.   Final Clinical Impression(s) / ED Diagnoses Final diagnoses:  Acute cystitis without hematuria  Generalized abdominal pain    Rx / DC Orders ED Discharge Orders         Ordered    ondansetron (ZOFRAN ODT) 4 MG disintegrating tablet  Every 8 hours PRN        04/13/20 0448    cephALEXin (KEFLEX) 500 MG capsule  3 times daily        04/13/20 0448           Koran Seabrook, Mayer Masker, MD 04/13/20 [phone]

## 2020-04-13 NOTE — Discharge Instructions (Addendum)
You were seen today for abdominal pain and vomiting.  Your work-up is largely reassuring with the exception of a urinary tract infection.  Make sure that you are staying hydrated.  Take nausea medication as needed.  You will be given a course of antibiotics.  If you develop worsening pain, fevers, you should be reevaluated.

## 2020-04-14 ENCOUNTER — Encounter (HOSPITAL_COMMUNITY): Payer: Self-pay

## 2020-04-14 ENCOUNTER — Emergency Department (HOSPITAL_COMMUNITY)
Admission: EM | Admit: 2020-04-14 | Discharge: 2020-04-14 | Disposition: A | Discharge status: Home / Self Care | Payer: Managed Care, Other (non HMO) | Attending: Emergency Medicine | Admitting: Emergency Medicine

## 2020-04-14 ENCOUNTER — Other Ambulatory Visit: Payer: Self-pay

## 2020-04-14 DIAGNOSIS — N390 Urinary tract infection, site not specified: Secondary | ICD-10-CM | POA: Diagnosis not present

## 2020-04-14 DIAGNOSIS — Z5321 Procedure and treatment not carried out due to patient leaving prior to being seen by health care provider: Secondary | ICD-10-CM | POA: Insufficient documentation

## 2020-04-14 DIAGNOSIS — R300 Dysuria: Secondary | ICD-10-CM | POA: Diagnosis present

## 2020-04-14 NOTE — ED Triage Notes (Signed)
Per EMS- patient c/o dysuria x 3 days. Patient was seen at [facility] and was diagnosed with a UTI. Patient states her symptoms have not resolved.

## 2020-04-15 ENCOUNTER — Emergency Department (HOSPITAL_BASED_OUTPATIENT_CLINIC_OR_DEPARTMENT_OTHER)
Admission: EM | Admit: 2020-04-15 | Discharge: 2020-04-15 | Disposition: A | Discharge status: Home / Self Care | Payer: Managed Care, Other (non HMO) | Attending: Emergency Medicine | Admitting: Emergency Medicine

## 2020-04-15 ENCOUNTER — Other Ambulatory Visit: Payer: Self-pay

## 2020-04-15 ENCOUNTER — Encounter (HOSPITAL_BASED_OUTPATIENT_CLINIC_OR_DEPARTMENT_OTHER): Payer: Self-pay | Admitting: Emergency Medicine

## 2020-04-15 DIAGNOSIS — R112 Nausea with vomiting, unspecified: Secondary | ICD-10-CM | POA: Diagnosis present

## 2020-04-15 DIAGNOSIS — J45909 Unspecified asthma, uncomplicated: Secondary | ICD-10-CM | POA: Insufficient documentation

## 2020-04-15 DIAGNOSIS — F1290 Cannabis use, unspecified, uncomplicated: Secondary | ICD-10-CM | POA: Diagnosis not present

## 2020-04-15 DIAGNOSIS — R109 Unspecified abdominal pain: Secondary | ICD-10-CM | POA: Insufficient documentation

## 2020-04-15 DIAGNOSIS — I10 Essential (primary) hypertension: Secondary | ICD-10-CM | POA: Insufficient documentation

## 2020-04-15 LAB — URINALYSIS, MICROSCOPIC (REFLEX)

## 2020-04-15 LAB — RAPID URINE DRUG SCREEN, HOSP PERFORMED
Amphetamines: NOT DETECTED
Barbiturates: NOT DETECTED
Benzodiazepines: NOT DETECTED
Cocaine: NOT DETECTED
Opiates: NOT DETECTED
Tetrahydrocannabinol: POSITIVE — AB

## 2020-04-15 LAB — COMPREHENSIVE METABOLIC PANEL
ALT: 21 U/L (ref 0–44)
AST: 20 U/L (ref 15–41)
Albumin: 4.2 g/dL (ref 3.5–5.0)
Alkaline Phosphatase: 49 U/L (ref 38–126)
Anion gap: 12 (ref 5–15)
BUN: 17 mg/dL (ref 6–20)
CO2: 26 mmol/L (ref 22–32)
Calcium: 9.2 mg/dL (ref 8.9–10.3)
Chloride: 101 mmol/L (ref 98–111)
Creatinine, Ser: 0.9 mg/dL (ref 0.44–1.00)
GFR calc Af Amer: >60 mL/min (ref >60)
GFR calc non Af Amer: >60 mL/min (ref >60)
Glucose, Bld: 97 mg/dL (ref 70–99)
Potassium: 3.3 mmol/L — ABNORMAL LOW (ref 3.5–5.1)
Sodium: 139 mmol/L (ref 135–145)
Total Bilirubin: 0.5 mg/dL (ref 0.3–1.2)
Total Protein: 8.1 g/dL (ref 6.5–8.1)

## 2020-04-15 LAB — CBC
HCT: 42 % (ref 36.0–46.0)
Hemoglobin: 14.2 g/dL (ref 12.0–15.0)
MCH: 31.2 pg (ref 26.0–34.0)
MCHC: 33.8 g/dL (ref 30.0–36.0)
MCV: 92.3 fL (ref 80.0–100.0)
Platelets: 281 10*3/uL (ref 150–400)
RBC: 4.55 MIL/uL (ref 3.87–5.11)
RDW: 11.7 % (ref 11.5–15.5)
WBC: 10.8 10*3/uL — ABNORMAL HIGH (ref 4.0–10.5)
nRBC: 0 % (ref 0.0–0.2)

## 2020-04-15 LAB — URINALYSIS, ROUTINE W REFLEX MICROSCOPIC
Glucose, UA: NEGATIVE mg/dL
Hgb urine dipstick: NEGATIVE
Ketones, ur: 40 mg/dL — AB
Leukocytes,Ua: NEGATIVE
Nitrite: NEGATIVE
Protein, ur: 30 mg/dL — AB
Specific Gravity, Urine: 1.025 (ref 1.005–1.030)
pH: 6.5 (ref 5.0–8.0)

## 2020-04-15 LAB — URINE CULTURE: Culture: >=100000 — AB

## 2020-04-15 LAB — PREGNANCY, URINE: Preg Test, Ur: NEGATIVE

## 2020-04-15 LAB — LIPASE, BLOOD: Lipase: 39 U/L (ref 11–51)

## 2020-04-15 MED ORDER — DIPHENHYDRAMINE HCL 50 MG/ML IJ SOLN
50.0000 mg | Freq: Once | INTRAMUSCULAR | Status: AC
  Administered 2020-04-15: 50 mg via INTRAVENOUS
  Filled 2020-04-15: qty 1

## 2020-04-15 MED ORDER — PROMETHAZINE HCL 25 MG/ML IJ SOLN
25.0000 mg | Freq: Once | INTRAMUSCULAR | Status: AC
  Administered 2020-04-15: 25 mg via INTRAVENOUS
  Filled 2020-04-15: qty 1

## 2020-04-15 MED ORDER — METOCLOPRAMIDE HCL 5 MG/ML IJ SOLN
10.0000 mg | Freq: Once | INTRAMUSCULAR | Status: AC
  Administered 2020-04-15: 10 mg via INTRAVENOUS
  Filled 2020-04-15: qty 2

## 2020-04-15 MED ORDER — SODIUM CHLORIDE 0.9 % IV BOLUS
1000.0000 mL | Freq: Once | INTRAVENOUS | Status: AC
  Administered 2020-04-15: 1000 mL via INTRAVENOUS

## 2020-04-15 MED ORDER — PROMETHAZINE HCL 25 MG PO TABS: 25.0000 mg | ORAL_TABLET | Freq: Three times a day (TID) | ORAL | 0 refills | Status: DC | PRN

## 2020-04-15 NOTE — Discharge Instructions (Addendum)
At this time there does not appear to be the presence of an emergent medical condition, however there is always the potential for conditions to change. Please read and follow the below instructions.  Please return to the Emergency Department immediately for any new or worsening symptoms. Please be sure to follow up with your Primary Care Provider within one week regarding your visit today; please call their office to schedule an appointment even if you are feeling better for a follow-up visit. Please drink plenty of water and get plenty of rest.  Please avoid using marijuana as this is unlikely contributing to your nausea and vomiting.  Go to the nearest Emergency Department immediately if: You have fever or chills You have pain in your chest, neck, arm, or jaw. You feel very weak or you pass out (faint). You have throw up that is bright red or looks like coffee grounds. You have bloody or black poop (stools) or poop that looks like tar. You have a very bad headache, a stiff neck, or both. You have very bad pain, cramping, or bloating in your belly (abdomen). You have trouble breathing or you are breathing very quickly. Your heart is beating very quickly. Your skin feels cold and clammy. You feel confused. You have signs of losing too much water in your body, such as: Dark pee, very little pee, or no pee. Cracked lips. Dry mouth. Sunken eyes. Sleepiness. Weakness. You have any new/concerning or worsening of symptoms  Please read the additional information packets attached to your discharge summary.  Do not take your medicine if  develop an itchy rash, swelling in your mouth or lips, or difficulty breathing; call 911 and seek immediate emergency medical attention if this occurs.  You may review your lab tests and imaging results in their entirety on your MyChart account.  Please discuss all results of fully with your primary care provider and other specialist at your follow-up  visit.  Note: Portions of this text may have been transcribed using voice recognition software. Every effort was made to ensure accuracy; however, inadvertent computerized transcription errors may still be present.

## 2020-04-15 NOTE — ED Triage Notes (Signed)
Pt reports persistent abdominal pain, recent Dx UTI, seen multiple times for same issues.

## 2020-04-15 NOTE — ED Provider Notes (Addendum)
[facility] EMERGENCY DEPARTMENT Provider Note   CSN: [id number] Arrival date & time: 04/15/20  1127     History Chief Complaint  Patient presents with  . Abdominal Pain    Tammy Andrews is a 29 y.o. female history of hyperemesis gravidarum, anxiety, asthma, anemia, hypertension, obesity, marijuana use.  Patient presents today for nausea vomiting no pain onset 2-3 days ago.  Describes a generalized aching sensation constant moderate-severe in intensity no aggravating or alleviating factors.  Associated with multiple episodes of nonbloody/nonbilious emesis.  She reports she was seen 2 days ago and was diagnosed with a UTI and has been compliant with her antibiotics but has had no improvement in nausea/vomiting and abdominal pain.  She denies fever/chills, headache, chest pain/shortness of breath, dysuria/hematuria, vaginal bleeding/discharge, concern for STI, rash, extremity pain/swelling, numbness/tingling, weakness or any additional concerns.  Of note patient reports that she does use marijuana last use 2 days ago. HPI     Past Medical History:  Diagnosis Date  . Anemia   . Anxiety   . Asthma    childhood  . Depression   . Hyperemesis gravidarum before end of [redacted] week gestation with dehydration 07/27/2018   16% wt loss by 19 weeks.  . Hypertension    PIH  . Pregnancy affected by fetal growth restriction 01/14/2019  . Preterm labor     Patient Active Problem List   Diagnosis Date Noted  . Presence of IUD 06/16/2019  . Chronic hypertension 02/13/2019  . Fetal demise in singleton pregnancy greater than [redacted] weeks gestation, antepartum 09/13/2018  . Marijuana abuse 08/09/2018  . H/O bariatric surgery 08/09/2018    Past Surgical History:  Procedure Laterality Date  . DILATION AND CURETTAGE OF UTERUS    . IUD REMOVAL N/A 06/20/2019   Procedure: INTRAUTERINE DEVICE (IUD) REMOVAL;  Surgeon: Allie Bossier, MD;  Location: [facility];  Service:  Gynecology;  Laterality: N/A;  . LAPAROSCOPIC GASTRIC SLEEVE RESECTION  08/2014     OB History    Gravida  3   Para  2   Term  0   Preterm  2   AB  1   Living  1     SAB  0   TAB  1   Ectopic  0   Multiple  0   Live Births  1        Obstetric Comments  G2: per pt, approx 24wks, woke with bleeding (no pain) and dx with IUFD when went to hospital. IOL SVD        Family History  Problem Relation Age of Onset  . Hypertension Father   . Diabetes Paternal Grandmother     Social History   Tobacco Use  . Smoking status: Never Smoker  . Smokeless tobacco: Never Used  Vaping Use  . Vaping Use: Never used  Substance Use Topics  . Alcohol use: Yes    Alcohol/week: 1.0 standard drink    Types: 1 Glasses of wine per week    Comment: socially  . Drug use: Not Currently    Types: Marijuana    Comment: last used 12/17/18    Home Medications Prior to Admission medications   Medication Sig Start Date End Date Taking? Authorizing Provider  acetaminophen (TYLENOL) 500 MG tablet Take 1,000 mg by mouth every 6 (six) hours as needed for moderate pain.   Yes [provider]  cephALEXin (KEFLEX) 500 MG capsule Take 1 capsule (500 mg total) by mouth  3 (three) times daily. 04/13/20  Yes Horton, Mayer Masker, MD  promethazine (PHENERGAN) 25 MG tablet Take 1 tablet (25 mg total) by mouth every 8 (eight) hours as needed for nausea or vomiting. 04/15/20   Harlene Salts A, PA-C  haloperidol (HALDOL) 5 MG tablet Take 1 tablet (5 mg total) by mouth every 6 (six) hours as needed (nausea or vomiting). 06/17/19 06/18/19  Dione Booze, MD    Allergies    Haldol [haloperidol lactate] and Pork-derived products  Review of Systems   Review of Systems Ten systems are reviewed and are negative for acute change except as noted in the HPI  Physical Exam Updated Vital Signs BP 127/78 (BP Location: Right Arm)   Pulse 80   Temp 98.8 F (37.1 C) (Oral)   Resp 19   LMP 03/06/2020    SpO2 100%   Physical Exam Constitutional:      General: She is not in acute distress.    Appearance: Normal appearance. She is well-developed. She is obese. She is not ill-appearing or diaphoretic.  HENT:     Head: Normocephalic and atraumatic.  Eyes:     General: Vision grossly intact. Gaze aligned appropriately.     Pupils: Pupils are equal, round, and reactive to light.  Neck:     Trachea: Trachea and phonation normal.  Cardiovascular:     Pulses:          Dorsalis pedis pulses are 2+ on the right side and 2+ on the left side.  Pulmonary:     Effort: Pulmonary effort is normal. No respiratory distress.  Abdominal:     General: There is no distension.     Palpations: Abdomen is soft.     Tenderness: There is generalized abdominal tenderness (Mild). There is no guarding or rebound. Negative signs include Murphy's sign and McBurney's sign.  Genitourinary:    Comments: Refused by patient Musculoskeletal:        General: Normal range of motion.     Cervical back: Normal range of motion.  Skin:    General: Skin is warm and dry.  Neurological:     Mental Status: She is alert.     GCS: GCS eye subscore is 4. GCS verbal subscore is 5. GCS motor subscore is 6.     Comments: Speech is clear and goal oriented, follows commands Major Cranial nerves without deficit, no facial droop Moves extremities without ataxia, coordination intact  Psychiatric:        Behavior: Behavior normal.     ED Results / Procedures / Treatments   Labs (all labs ordered are listed, but only abnormal results are displayed) Labs Reviewed  COMPREHENSIVE METABOLIC PANEL - Abnormal; Notable for the following components:      Result Value   Potassium 3.3 (*)    All other components within normal limits  CBC - Abnormal; Notable for the following components:   WBC 10.8 (*)    All other components within normal limits  URINALYSIS, ROUTINE W REFLEX MICROSCOPIC - Abnormal; Notable for the following components:     APPearance HAZY (*)    Bilirubin Urine SMALL (*)    Ketones, ur 40 (*)    Protein, ur 30 (*)    All other components within normal limits  URINALYSIS, MICROSCOPIC (REFLEX) - Abnormal; Notable for the following components:   Bacteria, UA FEW (*)    All other components within normal limits  RAPID URINE DRUG SCREEN, HOSP PERFORMED - Abnormal; Notable for the following  components:   Tetrahydrocannabinol POSITIVE (*)    All other components within normal limits  LIPASE, BLOOD  PREGNANCY, URINE    EKG EKG Interpretation  Date/Time:  Wednesday April 15 2020 16:13:47 EDT Ventricular Rate:  83 PR Interval:    QRS Duration: 109 QT Interval:  390 QTC Calculation: 459 R Axis:   84 Text Interpretation: Sinus rhythm Baseline wander in lead(s) II aVF No significant change since last tracing Confirmed by Richardean Canal [phone]) on 04/15/2020 4:16:48 PM   Radiology No results found.  Procedures Procedures (including critical care time)  Medications Ordered in ED Medications  sodium chloride 0.9 % bolus 1,000 mL (0 mLs Intravenous Stopped 04/15/20 1815)  promethazine (PHENERGAN) injection 25 mg (25 mg Intravenous Given 04/15/20 1701)  metoCLOPramide (REGLAN) injection 10 mg (10 mg Intravenous Given 04/15/20 1700)  diphenhydrAMINE (BENADRYL) injection 50 mg (50 mg Intravenous Given 04/15/20 1700)    ED Course  I have reviewed the triage vital signs and the nursing notes.  Pertinent labs & imaging results that were available during my care of the patient were reviewed by me and considered in my medical decision making (see chart for details).    MDM Rules/Calculators/A&P                         Additional history obtained from: 1. Nursing notes from this visit. 2. Electronic medical record reviewed.  Patient seen in ER 04/12/2020, diagnosis of acute cystitis without hematuria and generalized abdominal pain.  She was prescribed Keflex 500 mg p.o. 3 times daily as well as as needed  Zofran.  Labs reviewed significant for E. coli UTI sensitive to cefazolin.  She received 1 g Rocephin prior to discharge.  She had CT abdomen pelvis which showed no acute process but a 2 mm nonobstructing stone in the upper pole of the right kidney.  It appears patient then went to [facility], ER yesterday but left without being seen after triage.  On further chart review, in December 2020 patient had documented adverse reaction to Haldol with possible dystonic reaction. ------------------ I reviewed and interpreted labs which include: CBC shows mild leukocytosis of 10.8, no anemia. Lipase within normal limits. CMP shows mild hypokalemia at 3.3, no emergent electrolyte derangement, AKI, LFT elevations or gap. Urine pregnancy test is negative Urinalysis shows ketones protein and bilirubin, few bacteria, multiple squamous cells.  No nitrites or leukocytes, does not appear as urinary tract infection and patient without UTI symptoms.  Suspect urinary abnormalities are secondary to dehydration.  Patient's abdomen is minimally tender on exam there are no peritoneal signs no focal tenderness.  She admits to using marijuana, concerned this may be a cannabis hyperemesis syndrome.  Appears her previous UTIs being successfully treated with Keflex.  Will give fluid bolus for dehydration. - Discussed case with Dr. Silverio Lay, antiemetics Reglan/Benadryl/Phenergan ordered.  Peripheral IV placed by Dr. Silverio Lay. - UDS positive for THC.  Patient reevaluated she is resting comfortably scrolling on her phone.  She reports improvement of symptoms and is requesting discharge.  Suspect patient symptoms may be secondary to cannabis hyperemesis syndrome.  Reassessment of the abdomen is soft nontender, she appears stable for discharge.  Will give Phenergan and encourage PCP follow-up.  Patient advised to avoid using further marijuana.  Doubt appendicitis, cholecystitis, SBO, perforation, pyelonephritis or other emergent  pathologies.  At this time there does not appear to be any evidence of an acute emergency medical condition and the patient  appears stable for discharge with appropriate outpatient follow up. Diagnosis was discussed with patient who verbalizes understanding of care plan and is agreeable to discharge. I have discussed return precautions with patient who verbalizes understanding. Patient encouraged to follow-up with their PCP. All questions answered.  Patient seen and evaluated by Dr. Silverio LayYao who agrees with plan to discharge with follow-up.   Note: Portions of this report may have been transcribed using voice recognition software. Every effort was made to ensure accuracy; however, inadvertent computerized transcription errors may still be present. Final Clinical Impression(s) / ED Diagnoses Final diagnoses:  Non-intractable vomiting with nausea, unspecified vomiting type  Cannabis use, unspecified, uncomplicated    Rx / DC Orders ED Discharge Orders         Ordered    promethazine (PHENERGAN) 25 MG tablet  Every 8 hours PRN        04/15/20 1821              Elizabeth PalauMorelli, Orpha Dain A, PA-C 04/15/20 1836    Charlynne PanderYao, David Hsienta, MD 04/15/20 [phone]

## 2020-04-16 ENCOUNTER — Telehealth: Payer: Self-pay | Admitting: *Deleted

## 2020-04-16 NOTE — Telephone Encounter (Signed)
Post ED Visit - Positive Culture Follow-up  Culture report reviewed by antimicrobial stewardship pharmacist: [facility] []  , Pharm.D. []  Enzo Bi, Pharm.D., BCPS AQ-ID []  , Pharm.D., BCPS []  Celedonio Miyamoto, Pharm.D., BCPS []  Guion, Garvin Fila.D., BCPS, AAHIVP []  , Pharm.D., BCPS, AAHIVP [x]  Georgina Pillion, PharmD, BCPS []  , PharmD, BCPS []  [place], PharmD, BCPS []  [address], PharmD []  , PharmD, BCPS []  Estella Husk, PharmD  Pharmacy Team []  Lysle Pearl, PharmD []  , PharmD []  Phillips Climes, PharmD []  , Rph []  Agapito Games) , PharmD []  Verlan Friends, PharmD []  , PharmD []  Mervyn Gay, PharmD []  , PharmD []  Vinnie Level, PharmD []  [facility], PharmD []  , PharmD []  Len Childs, PharmD   Positive urine culture Treated with Cephalexin, organism sensitive to the same and no further patient follow-up is required at this time.  [facility] 04/16/2020, 10:22 AM

## 2021-02-15 ENCOUNTER — Other Ambulatory Visit: Payer: Self-pay

## 2021-02-15 ENCOUNTER — Encounter (HOSPITAL_BASED_OUTPATIENT_CLINIC_OR_DEPARTMENT_OTHER): Payer: Self-pay

## 2021-02-15 ENCOUNTER — Emergency Department (HOSPITAL_BASED_OUTPATIENT_CLINIC_OR_DEPARTMENT_OTHER)
Admission: EM | Admit: 2021-02-15 | Discharge: 2021-02-15 | Disposition: A | Discharge status: Home / Self Care | Payer: BC Managed Care – PPO | Attending: Emergency Medicine | Admitting: Emergency Medicine

## 2021-02-15 ENCOUNTER — Other Ambulatory Visit (HOSPITAL_BASED_OUTPATIENT_CLINIC_OR_DEPARTMENT_OTHER): Payer: Self-pay

## 2021-02-15 ENCOUNTER — Emergency Department (HOSPITAL_BASED_OUTPATIENT_CLINIC_OR_DEPARTMENT_OTHER): Payer: BC Managed Care – PPO

## 2021-02-15 DIAGNOSIS — J45909 Unspecified asthma, uncomplicated: Secondary | ICD-10-CM | POA: Diagnosis not present

## 2021-02-15 DIAGNOSIS — R1084 Generalized abdominal pain: Secondary | ICD-10-CM | POA: Diagnosis present

## 2021-02-15 DIAGNOSIS — R112 Nausea with vomiting, unspecified: Secondary | ICD-10-CM

## 2021-02-15 DIAGNOSIS — I10 Essential (primary) hypertension: Secondary | ICD-10-CM | POA: Insufficient documentation

## 2021-02-15 DIAGNOSIS — E860 Dehydration: Secondary | ICD-10-CM | POA: Diagnosis not present

## 2021-02-15 DIAGNOSIS — N3000 Acute cystitis without hematuria: Secondary | ICD-10-CM | POA: Diagnosis not present

## 2021-02-15 LAB — URINALYSIS, ROUTINE W REFLEX MICROSCOPIC
Glucose, UA: NEGATIVE mg/dL
Hgb urine dipstick: NEGATIVE
Ketones, ur: >80 mg/dL — AB
Leukocytes,Ua: NEGATIVE
Nitrite: POSITIVE — AB
Protein, ur: 100 mg/dL — AB
Specific Gravity, Urine: 1.025 (ref 1.005–1.030)
pH: 6.5 (ref 5.0–8.0)

## 2021-02-15 LAB — CBC
HCT: 40.8 % (ref 36.0–46.0)
Hemoglobin: 14.2 g/dL (ref 12.0–15.0)
MCH: 31.8 pg (ref 26.0–34.0)
MCHC: 34.8 g/dL (ref 30.0–36.0)
MCV: 91.5 fL (ref 80.0–100.0)
Platelets: 249 10*3/uL (ref 150–400)
RBC: 4.46 MIL/uL (ref 3.87–5.11)
RDW: 12.4 % (ref 11.5–15.5)
WBC: 14.6 10*3/uL — ABNORMAL HIGH (ref 4.0–10.5)
nRBC: 0 % (ref 0.0–0.2)

## 2021-02-15 LAB — COMPREHENSIVE METABOLIC PANEL
ALT: 21 U/L (ref 0–44)
AST: 24 U/L (ref 15–41)
Albumin: 4.1 g/dL (ref 3.5–5.0)
Alkaline Phosphatase: 53 U/L (ref 38–126)
Anion gap: 12 (ref 5–15)
BUN: 21 mg/dL — ABNORMAL HIGH (ref 6–20)
CO2: 24 mmol/L (ref 22–32)
Calcium: 9.3 mg/dL (ref 8.9–10.3)
Chloride: 102 mmol/L (ref 98–111)
Creatinine, Ser: 0.8 mg/dL (ref 0.44–1.00)
GFR, Estimated: >60 mL/min (ref >60)
Glucose, Bld: 85 mg/dL (ref 70–99)
Potassium: 3 mmol/L — ABNORMAL LOW (ref 3.5–5.1)
Sodium: 138 mmol/L (ref 135–145)
Total Bilirubin: 0.9 mg/dL (ref 0.3–1.2)
Total Protein: 8.2 g/dL — ABNORMAL HIGH (ref 6.5–8.1)

## 2021-02-15 LAB — URINALYSIS, MICROSCOPIC (REFLEX)

## 2021-02-15 LAB — MAGNESIUM: Magnesium: 2.1 mg/dL (ref 1.7–2.4)

## 2021-02-15 LAB — PREGNANCY, URINE: Preg Test, Ur: NEGATIVE

## 2021-02-15 LAB — LIPASE, BLOOD: Lipase: 39 U/L (ref 11–51)

## 2021-02-15 MED ORDER — PROMETHAZINE HCL 25 MG PO TABS
25.0000 mg | ORAL_TABLET | Freq: Three times a day (TID) | ORAL | 0 refills | Status: DC | PRN
  Filled 2021-02-15: qty 10, 4d supply, fill #0

## 2021-02-15 MED ORDER — POTASSIUM CHLORIDE 10 MEQ/100ML IV SOLN
10.0000 meq | Freq: Once | INTRAVENOUS | Status: AC
  Administered 2021-02-15: 10 meq via INTRAVENOUS
  Filled 2021-02-15: qty 100

## 2021-02-15 MED ORDER — METOCLOPRAMIDE HCL 5 MG/ML IJ SOLN
10.0000 mg | Freq: Once | INTRAMUSCULAR | Status: AC
  Administered 2021-02-15: 10 mg via INTRAVENOUS
  Filled 2021-02-15: qty 2

## 2021-02-15 MED ORDER — DIPHENHYDRAMINE HCL 50 MG/ML IJ SOLN
25.0000 mg | Freq: Once | INTRAMUSCULAR | Status: AC
  Administered 2021-02-15: 25 mg via INTRAVENOUS
  Filled 2021-02-15: qty 1

## 2021-02-15 MED ORDER — ONDANSETRON HCL 4 MG/2ML IJ SOLN: 4.0000 mg | Freq: Once | INTRAMUSCULAR | Status: DC | PRN

## 2021-02-15 MED ORDER — IBUPROFEN 600 MG PO TABS
600.0000 mg | ORAL_TABLET | Freq: Four times a day (QID) | ORAL | 0 refills | Status: AC | PRN
  Filled 2021-02-15: qty 30, 8d supply, fill #0

## 2021-02-15 MED ORDER — HYDROMORPHONE HCL 1 MG/ML IJ SOLN
1.0000 mg | Freq: Once | INTRAMUSCULAR | Status: AC
  Administered 2021-02-15: 1 mg via INTRAVENOUS
  Filled 2021-02-15: qty 1

## 2021-02-15 MED ORDER — MORPHINE SULFATE (PF) 4 MG/ML IV SOLN
4.0000 mg | Freq: Once | INTRAVENOUS | Status: AC
  Administered 2021-02-15: 4 mg via INTRAVENOUS
  Filled 2021-02-15: qty 1

## 2021-02-15 MED ORDER — SODIUM CHLORIDE 0.9 % IV SOLN
1.0000 g | Freq: Once | INTRAVENOUS | Status: AC
  Administered 2021-02-15: 1 g via INTRAVENOUS
  Filled 2021-02-15: qty 10

## 2021-02-15 MED ORDER — PROMETHAZINE HCL 25 MG/ML IJ SOLN
INTRAMUSCULAR | Status: AC
  Filled 2021-02-15: qty 1

## 2021-02-15 MED ORDER — IOHEXOL 300 MG/ML  SOLN
100.0000 mL | Freq: Once | INTRAMUSCULAR | Status: AC | PRN
  Administered 2021-02-15: 100 mL via INTRAVENOUS

## 2021-02-15 MED ORDER — ACETAMINOPHEN 500 MG PO TABS
1000.0000 mg | ORAL_TABLET | Freq: Once | ORAL | Status: AC
  Administered 2021-02-15: 1000 mg via ORAL
  Filled 2021-02-15: qty 2

## 2021-02-15 MED ORDER — IOHEXOL 300 MG/ML  SOLN: 75.0000 mL | Freq: Once | INTRAMUSCULAR | Status: DC | PRN

## 2021-02-15 MED ORDER — SODIUM CHLORIDE 0.9 % IV BOLUS
1000.0000 mL | Freq: Once | INTRAVENOUS | Status: AC
  Administered 2021-02-15: 1000 mL via INTRAVENOUS

## 2021-02-15 MED ORDER — SODIUM CHLORIDE 0.9 % IV SOLN
25.0000 mg | Freq: Once | INTRAVENOUS | Status: AC
  Administered 2021-02-15: 25 mg via INTRAVENOUS
  Filled 2021-02-15: qty 1

## 2021-02-15 MED ORDER — CEPHALEXIN 500 MG PO CAPS
500.0000 mg | ORAL_CAPSULE | Freq: Three times a day (TID) | ORAL | 0 refills | Status: AC
  Filled 2021-02-15: qty 21, 7d supply, fill #0

## 2021-02-15 NOTE — ED Provider Notes (Signed)
[facility] EMERGENCY DEPARTMENT Provider Note   CSN: [id number] Arrival date & time: 02/15/21  0708     History Chief Complaint  Patient presents with   Abdominal Pain    Tammy Andrews is a 30 y.o. female.  Pt presents to the ED today with n/v and abdominal pain.  Pt said sx have been going on for 4 days.  She denies any f/c.  She does have a hx of hyperemesis due to MJ use.  She continues to smoke MJ.       Past Medical History:  Diagnosis Date   Anemia    Anxiety    Asthma    childhood   Depression    Hyperemesis gravidarum before end of [redacted] week gestation with dehydration 07/27/2018   16% wt loss by 19 weeks.   Hypertension    PIH   Pregnancy affected by fetal growth restriction 01/14/2019   Preterm labor     Patient Active Problem List   Diagnosis Date Noted   Presence of IUD 06/16/2019   Chronic hypertension 02/13/2019   Fetal demise in singleton pregnancy greater than [redacted] weeks gestation, antepartum 09/13/2018   Marijuana abuse 08/09/2018   H/O bariatric surgery 08/09/2018    Past Surgical History:  Procedure Laterality Date   DILATION AND CURETTAGE OF UTERUS     IUD REMOVAL N/A 06/20/2019   Procedure: INTRAUTERINE DEVICE (IUD) REMOVAL;  Surgeon: Allie Bossier, MD;  Location: [facility];  Service: Gynecology;  Laterality: N/A;   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 IOL SVD         Family History  Problem Relation Age of Onset   Hypertension Father    Diabetes Paternal Grandmother     Social History   Tobacco Use   Smoking status: Never   Smokeless tobacco: Never  Vaping Use   Vaping Use: Never used  Substance Use  Topics   Alcohol use: Yes    Alcohol/week: 1.0 standard drink    Types: 1 Glasses of wine per week    Comment: socially   Drug use: Yes    Types: Marijuana    Comment: last used 12/17/18    Home Medications Prior to Admission medications   Medication Sig Start Date End Date Taking? Authorizing Provider  ibuprofen (ADVIL) 600 MG tablet Take 1 tablet (600 mg total) by mouth every 6 (six) hours as needed. 02/15/21  Yes Jacalyn Lefevre, MD  acetaminophen (TYLENOL) 500 MG tablet Take 1,000 mg by mouth every 6 (six) hours as needed for moderate pain.    [provider]  cephALEXin (KEFLEX) 500 MG capsule Take 1 capsule (500 mg total) by mouth 3 (three) times daily. 02/15/21   Jacalyn Lefevre, MD  promethazine (PHENERGAN) 25 MG tablet Take 1 tablet (25 mg total) by mouth every 8 (eight) hours as needed for nausea or vomiting. 02/15/21   Jacalyn Lefevre, MD  haloperidol (HALDOL) 5 MG  tablet Take 1 tablet (5 mg total) by mouth every 6 (six) hours as needed (nausea or vomiting). 06/17/19 06/18/19  Dione Booze, MD    Allergies    Haldol [haloperidol lactate] and Pork-derived products  Review of Systems   Review of Systems  Gastrointestinal:  Positive for abdominal pain, nausea and vomiting.  All other systems reviewed and are negative.  Physical Exam Updated Vital Signs BP (!) 133/98   Pulse 80   Temp 98.5 F (36.9 C) (Oral)   Resp 15   Ht 5\' 7"  (1.702 m)   Wt 108.9 kg   LMP 12/20/2020 Comment: negative u-preg today  SpO2 100%   BMI 37.59 kg/m   Physical Exam Vitals and nursing note reviewed.  Constitutional:      Appearance: She is well-developed.  HENT:     Head: Normocephalic and atraumatic.     Mouth/Throat:     Mouth: Mucous membranes are dry.  Eyes:     Extraocular Movements: Extraocular movements intact.     Pupils: Pupils are equal, round, and reactive to light.  Cardiovascular:     Rate and Rhythm: Normal rate and regular rhythm.     Heart sounds: Normal heart  sounds.  Pulmonary:     Effort: Pulmonary effort is normal.     Breath sounds: Normal breath sounds.  Abdominal:     General: Abdomen is flat. Bowel sounds are normal.     Palpations: Abdomen is soft.     Tenderness: There is abdominal tenderness in the suprapubic area.  Skin:    General: Skin is warm.     Capillary Refill: Capillary refill takes less than 2 seconds.  Neurological:     General: No focal deficit present.     Mental Status: She is alert and oriented to person, place, and time.  Psychiatric:        Mood and Affect: Mood normal.        Behavior: Behavior normal.    ED Results / Procedures / Treatments   Labs (all labs ordered are listed, but only abnormal results are displayed) Labs Reviewed  COMPREHENSIVE METABOLIC PANEL - Abnormal; Notable for the following components:      Result Value   Potassium 3.0 (*)    BUN 21 (*)    Total Protein 8.2 (*)    All other components within normal limits  CBC - Abnormal; Notable for the following components:   WBC 14.6 (*)    All other components within normal limits  URINALYSIS, ROUTINE W REFLEX MICROSCOPIC - Abnormal; Notable for the following components:   Color, Urine AMBER (*)    APPearance HAZY (*)    Bilirubin Urine SMALL (*)    Ketones, ur >80 (*)    Protein, ur 100 (*)    Nitrite POSITIVE (*)    All other components within normal limits  URINALYSIS, MICROSCOPIC (REFLEX) - Abnormal; Notable for the following components:   Bacteria, UA MANY (*)    All other components within normal limits  LIPASE, BLOOD  PREGNANCY, URINE  MAGNESIUM    EKG None  Radiology CT ABDOMEN PELVIS W CONTRAST  Result Date: 02/15/2021 CLINICAL DATA:  Pericardial female with abdominal pain. EXAM: CT ABDOMEN AND PELVIS WITH CONTRAST TECHNIQUE: Multidetector CT imaging of the abdomen was performed initially following the standard protocol before administration of intravenous contrast. Multidetector CT imaging of the abdomen and pelvis was  then performed following the standard protocol during the bolus injection of intravenous contrast. CONTRAST:  OMNIPAQUE IOHEXOL 300 MG/ML  SOLN COMPARISON:  CT abdomen and pelvis, 04/13/2020 FINDINGS: Lower chest: No acute abnormality. Hepatobiliary: No focal liver abnormality is seen. No gallstones, gallbladder wall thickening, or biliary dilatation. Pancreas: Unremarkable. No pancreatic ductal dilatation or surrounding inflammatory changes. Spleen: Normal in size without focal abnormality. Adrenals/Urinary Tract: Adrenal glands are unremarkable. Kidneys are normal, without renal calculi, focal lesion, or hydronephrosis. Bladder is unremarkable. Stomach/Bowel: Postsurgical changes of partial gastrectomy. Appendix appears normal. No evidence of bowel wall thickening, distention, or inflammatory changes. Vascular/Lymphatic: No significant vascular findings are present. No enlarged abdominal or pelvic lymph nodes. Reproductive: Uterus and bilateral adnexa are unremarkable. Other: No abdominal wall hernia or abnormality. Trace pelvic, ascites likely physiologic in a female of child-bearing age. Musculoskeletal: No acute or significant osseous findings. IMPRESSION: No acute abdominopelvic process. Electronically Signed   By: Roanna Banning MD   On: 02/15/2021 10:53    Procedures Procedures   Medications Ordered in ED Medications  promethazine (PHENERGAN) 25 MG/ML injection (has no administration in time range)  sodium chloride 0.9 % bolus 1,000 mL (0 mLs Intravenous Stopped 02/15/21 0832)  promethazine (PHENERGAN) 25 mg in sodium chloride 0.9 % 50 mL IVPB (0 mg Intravenous Stopped 02/15/21 0803)  metoCLOPramide (REGLAN) injection 10 mg (10 mg Intravenous Given 02/15/21 0735)  diphenhydrAMINE (BENADRYL) injection 25 mg (25 mg Intravenous Given 02/15/21 0737)  potassium chloride 10 mEq in 100 mL IVPB (0 mEq Intravenous Stopped 02/15/21 0919)  morphine 4 MG/ML injection 4 mg (4 mg Intravenous Given 02/15/21 0952)   iohexol (OMNIPAQUE) 300 MG/ML solution 100 mL (100 mLs Intravenous Contrast Given 02/15/21 1025)  HYDROmorphone (DILAUDID) injection 1 mg (1 mg Intravenous Given 02/15/21 1057)  cefTRIAXone (ROCEPHIN) 1 g in sodium chloride 0.9 % 100 mL IVPB (0 g Intravenous Stopped 02/15/21 1158)    ED Course  I have reviewed the triage vital signs and the nursing notes.  Pertinent labs & imaging results that were available during my care of the patient were reviewed by me and considered in my medical decision making (see chart for details).    MDM Rules/Calculators/A&P                           Pt is feeling better.  She does have a uti, so she is given rocephin in ED and is d/c with keflex.  Pt is able to tolerate fluids.  She is stable for d/c.  She is encouraged to avoid MJ.  Return if worse. Final Clinical Impression(s) / ED Diagnoses Final diagnoses:  Acute cystitis without hematuria  Generalized abdominal pain  Non-intractable vomiting with nausea, unspecified vomiting type  Dehydration    Rx / DC Orders ED Discharge Orders          Ordered    cephALEXin (KEFLEX) 500 MG capsule  3 times daily        02/15/21 1245    ibuprofen (ADVIL) 600 MG tablet  Every 6 hours PRN        02/15/21 1245    promethazine (PHENERGAN) 25 MG tablet  Every 8 hours PRN        02/15/21 1245             Jacalyn Lefevre, MD 02/15/21 1259

## 2021-02-15 NOTE — Discharge Instructions (Addendum)
Stop using marijuana

## 2021-02-15 NOTE — ED Triage Notes (Signed)
C/o LLQ pain x 4 days. + for N/V/D. Took tylenol w/o relief.

## 2021-02-17 ENCOUNTER — Emergency Department (HOSPITAL_BASED_OUTPATIENT_CLINIC_OR_DEPARTMENT_OTHER): Payer: BC Managed Care – PPO

## 2021-02-17 ENCOUNTER — Other Ambulatory Visit: Payer: Self-pay

## 2021-02-17 ENCOUNTER — Encounter (HOSPITAL_BASED_OUTPATIENT_CLINIC_OR_DEPARTMENT_OTHER): Payer: Self-pay | Admitting: Emergency Medicine

## 2021-02-17 ENCOUNTER — Emergency Department (HOSPITAL_BASED_OUTPATIENT_CLINIC_OR_DEPARTMENT_OTHER)
Admission: EM | Admit: 2021-02-17 | Discharge: 2021-02-17 | Disposition: A | Discharge status: Home / Self Care | Payer: BC Managed Care – PPO | Attending: Emergency Medicine | Admitting: Emergency Medicine

## 2021-02-17 DIAGNOSIS — R109 Unspecified abdominal pain: Secondary | ICD-10-CM | POA: Diagnosis present

## 2021-02-17 DIAGNOSIS — N12 Tubulo-interstitial nephritis, not specified as acute or chronic: Secondary | ICD-10-CM | POA: Insufficient documentation

## 2021-02-17 DIAGNOSIS — R112 Nausea with vomiting, unspecified: Secondary | ICD-10-CM

## 2021-02-17 DIAGNOSIS — I10 Essential (primary) hypertension: Secondary | ICD-10-CM | POA: Diagnosis not present

## 2021-02-17 DIAGNOSIS — J45909 Unspecified asthma, uncomplicated: Secondary | ICD-10-CM | POA: Insufficient documentation

## 2021-02-17 LAB — COMPREHENSIVE METABOLIC PANEL
ALT: 21 U/L (ref 0–44)
AST: 24 U/L (ref 15–41)
Albumin: 4 g/dL (ref 3.5–5.0)
Alkaline Phosphatase: 49 U/L (ref 38–126)
Anion gap: 8 (ref 5–15)
BUN: 18 mg/dL (ref 6–20)
CO2: 26 mmol/L (ref 22–32)
Calcium: 8.4 mg/dL — ABNORMAL LOW (ref 8.9–10.3)
Chloride: 103 mmol/L (ref 98–111)
Creatinine, Ser: 0.84 mg/dL (ref 0.44–1.00)
GFR, Estimated: >60 mL/min (ref >60)
Glucose, Bld: 110 mg/dL — ABNORMAL HIGH (ref 70–99)
Potassium: 3 mmol/L — ABNORMAL LOW (ref 3.5–5.1)
Sodium: 137 mmol/L (ref 135–145)
Total Bilirubin: 0.5 mg/dL (ref 0.3–1.2)
Total Protein: 7.6 g/dL (ref 6.5–8.1)

## 2021-02-17 LAB — CBC WITH DIFFERENTIAL/PLATELET
Abs Immature Granulocytes: 0.04 10*3/uL (ref 0.00–0.07)
Basophils Absolute: 0.1 10*3/uL (ref 0.0–0.1)
Basophils Relative: 0 %
Eosinophils Absolute: 0.1 10*3/uL (ref 0.0–0.5)
Eosinophils Relative: 1 %
HCT: 40 % (ref 36.0–46.0)
Hemoglobin: 14.2 g/dL (ref 12.0–15.0)
Immature Granulocytes: 0 %
Lymphocytes Relative: 17 %
Lymphs Abs: 2 10*3/uL (ref 0.7–4.0)
MCH: 31.7 pg (ref 26.0–34.0)
MCHC: 35.5 g/dL (ref 30.0–36.0)
MCV: 89.3 fL (ref 80.0–100.0)
Monocytes Absolute: 0.6 10*3/uL (ref 0.1–1.0)
Monocytes Relative: 5 %
Neutro Abs: 9.1 10*3/uL — ABNORMAL HIGH (ref 1.7–7.7)
Neutrophils Relative %: 77 %
Platelets: 237 10*3/uL (ref 150–400)
RBC: 4.48 MIL/uL (ref 3.87–5.11)
RDW: 11.9 % (ref 11.5–15.5)
WBC: 11.9 10*3/uL — ABNORMAL HIGH (ref 4.0–10.5)
nRBC: 0 % (ref 0.0–0.2)

## 2021-02-17 LAB — URINALYSIS, ROUTINE W REFLEX MICROSCOPIC
Bilirubin Urine: NEGATIVE
Glucose, UA: NEGATIVE mg/dL
Hgb urine dipstick: NEGATIVE
Ketones, ur: 15 mg/dL — AB
Leukocytes,Ua: NEGATIVE
Nitrite: NEGATIVE
Protein, ur: NEGATIVE mg/dL
Specific Gravity, Urine: 1.025 (ref 1.005–1.030)
pH: 6.5 (ref 5.0–8.0)

## 2021-02-17 LAB — PREGNANCY, URINE: Preg Test, Ur: NEGATIVE

## 2021-02-17 LAB — LIPASE, BLOOD: Lipase: 48 U/L (ref 11–51)

## 2021-02-17 MED ORDER — PROMETHAZINE HCL 25 MG/ML IJ SOLN
INTRAMUSCULAR | Status: AC
  Filled 2021-02-17: qty 1

## 2021-02-17 MED ORDER — HYDROMORPHONE HCL 1 MG/ML IJ SOLN
1.0000 mg | Freq: Once | INTRAMUSCULAR | Status: AC
  Administered 2021-02-17: 1 mg via INTRAVENOUS
  Filled 2021-02-17: qty 1

## 2021-02-17 MED ORDER — METOCLOPRAMIDE HCL 5 MG/ML IJ SOLN
10.0000 mg | Freq: Once | INTRAMUSCULAR | Status: AC
  Administered 2021-02-17: 10 mg via INTRAVENOUS
  Filled 2021-02-17: qty 2

## 2021-02-17 MED ORDER — SODIUM CHLORIDE 0.9 % IV SOLN
25.0000 mg | Freq: Four times a day (QID) | INTRAVENOUS | Status: DC | PRN
  Administered 2021-02-17: 25 mg via INTRAVENOUS
  Filled 2021-02-17: qty 1

## 2021-02-17 MED ORDER — POTASSIUM CHLORIDE 10 MEQ/100ML IV SOLN
10.0000 meq | INTRAVENOUS | Status: AC
  Administered 2021-02-17 (×2): 10 meq via INTRAVENOUS
  Filled 2021-02-17 (×2): qty 100

## 2021-02-17 MED ORDER — MORPHINE SULFATE (PF) 4 MG/ML IV SOLN
4.0000 mg | Freq: Once | INTRAVENOUS | Status: AC
  Administered 2021-02-17: 4 mg via INTRAVENOUS
  Filled 2021-02-17: qty 1

## 2021-02-17 MED ORDER — ONDANSETRON 4 MG PO TBDP: 4.0000 mg | ORAL_TABLET | Freq: Three times a day (TID) | ORAL | 0 refills | Status: DC | PRN

## 2021-02-17 MED ORDER — OXYCODONE-ACETAMINOPHEN 5-325 MG PO TABS: 1.0000 | ORAL_TABLET | Freq: Four times a day (QID) | ORAL | 0 refills | Status: DC | PRN

## 2021-02-17 MED ORDER — METOCLOPRAMIDE HCL 10 MG PO TABS: 10.0000 mg | ORAL_TABLET | Freq: Four times a day (QID) | ORAL | 0 refills | Status: DC

## 2021-02-17 MED ORDER — SODIUM CHLORIDE 0.9 % IV BOLUS
1000.0000 mL | Freq: Once | INTRAVENOUS | Status: AC
  Administered 2021-02-17: 1000 mL via INTRAVENOUS

## 2021-02-17 NOTE — ED Provider Notes (Addendum)
[facility] EMERGENCY DEPARTMENT Provider Note   CSN: [id number] Arrival date & time: 02/17/21  1217     History Chief Complaint  Patient presents with   Pain    Tammy Andrews is a 30 y.o. female with past medical history significant for anemia, childhood asthma, cannabinoid hyperemesis who presents for evaluation of nausea, vomiting, abdominal pain and left flank pain.  Began approximately 6 days ago.  Was seen here in the emergency department 2 days ago diagnosed with UTI.  Given Rocephin, sent home on Keflex.  Patient states she continues to have multiple episodes of NBNB emesis.  Has not been able to keep down her medications.  States she last used marijuana approximately 5 days ago.  She denies fever, chills, chest pain, shortness of breath, unilateral leg swelling, redness, warmth, PE, DVT, pelvic pain, vaginal discharge.  Denies any current dysuria however does have some frequency.  Rates her current pain a 10/10.  Denies additional aggravating or alleviating factors.  History obtained from patient and past medical records.  No interpreter used  HPI     Past Medical History:  Diagnosis Date   Anemia    Anxiety    Asthma    childhood   Depression    Hyperemesis gravidarum before end of [redacted] week gestation with dehydration 07/27/2018   16% wt loss by 19 weeks.   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 IOL SVD         Family History  Problem Relation Age of Onset   Hypertension Father    Diabetes Paternal Grandmother     Social History   Tobacco Use   Smoking status: Never   Smokeless tobacco: Never  Vaping Use   Vaping Use: Never used  Substance Use Topics   Alcohol use: Yes    Alcohol/week: 1.0 standard drink    Types: 1 Glasses of wine per week    Comment: socially   Drug use: Yes    Types: Marijuana    Comment: last used 12/17/18    Home Medications Prior to Admission medications   Medication Sig Start Date End Date Taking? Authorizing Provider  acetaminophen (TYLENOL) 500 MG tablet Take 1,000 mg by mouth every 6 (six) hours as needed  for moderate pain.   Yes [provider]  cephALEXin (KEFLEX) 500 MG capsule Take 1 capsule (500 mg total) by mouth 3 (three) times daily. 02/15/21  Yes Jacalyn LefevreHaviland, Julie, MD  ibuprofen (ADVIL) 600 MG tablet Take 1 tablet (600 mg total) by mouth every 6 (six) hours as needed. 02/15/21  Yes Jacalyn LefevreHaviland, Julie, MD  metoCLOPramide (REGLAN) 10 MG tablet Take 1 tablet (10 mg total) by mouth every 6 (six) hours. 02/17/21  Yes Lamya Lausch A, PA-C  ondansetron (ZOFRAN ODT) 4 MG disintegrating tablet Take 1 tablet (4 mg total) by mouth every 8 (eight) hours as needed for nausea or vomiting. 02/17/21  Yes Latangela Mccomas A, PA-C  oxyCODONE-acetaminophen (PERCOCET/ROXICET) 5-325 MG tablet Take 1 tablet by mouth every 6 (six) hours as needed for severe pain. 02/17/21  Yes Sabah Zucco A, PA-C  promethazine (PHENERGAN) 25 MG tablet Take 1 tablet (25 mg total) by mouth  every 8 (eight) hours as needed for nausea or vomiting. 02/15/21  Yes Jacalyn LefevreHaviland, Julie, MD  haloperidol (HALDOL) 5 MG tablet Take 1 tablet (5 mg total) by mouth every 6 (six) hours as needed (nausea or vomiting). 06/17/19 06/18/19  Dione BoozeGlick, David, MD    Allergies    Haldol [haloperidol lactate] and Pork-derived products  Review of Systems   Review of Systems  Constitutional: Negative.   HENT: Negative.    Respiratory: Negative.    Cardiovascular: Negative.   Gastrointestinal:  Positive for abdominal pain, nausea and vomiting. Negative for abdominal distention, anal bleeding, blood in stool, constipation, diarrhea and rectal pain.  Genitourinary:  Positive for flank pain and frequency. Negative for decreased urine volume, difficulty urinating, dysuria, hematuria, menstrual problem, pelvic pain, urgency, vaginal bleeding, vaginal discharge and vaginal pain.  Neurological: Negative.   All other systems reviewed and are negative.  Physical Exam Updated Vital Signs BP (!) 132/94   Pulse 71   Temp 98.6 F (37 C) (Oral)   Resp 13   Ht 5\' 7"  (1.702 m)   Wt 108.9 kg   LMP 01/15/2021 (Approximate)   SpO2 100%   BMI 37.59 kg/m   Physical Exam Vitals and nursing note reviewed.  Constitutional:      General: She is not in acute distress.    Appearance: She is well-developed. She is not ill-appearing, toxic-appearing or diaphoretic.     Comments: Crying  HENT:     Head: Normocephalic and atraumatic.     Nose: Nose normal.     Mouth/Throat:     Mouth: Mucous membranes are moist.  Eyes:     Pupils: Pupils are equal, round, and reactive to light.  Cardiovascular:     Rate and Rhythm: Normal rate.     Pulses: Normal pulses.          Radial pulses are 2+ on the right side and 2+ on the left side.       Dorsalis pedis pulses are 2+ on the right side and 2+ on the left side.       Posterior tibial pulses are 2+ on the right side and 2+ on the left side.     Heart sounds: Normal heart sounds.   Pulmonary:     Effort: Pulmonary effort is normal. No respiratory distress.     Breath sounds: Normal breath sounds.  Abdominal:     General: There is no distension.     Palpations: Abdomen is soft.     Tenderness: There is abdominal tenderness.  Comments: Diffuse tenderness to left abdomen.  Positive CVA tap on left.  Genitourinary:    Comments: Declined, no concern for STD Musculoskeletal:        General: No swelling or tenderness. Normal range of motion.     Cervical back: Normal range of motion.     Comments: Compartments soft.  No bony tenderness.  Denna Haggard' sign negative  Skin:    General: Skin is warm and dry.     Capillary Refill: Capillary refill takes less than 2 seconds.     Comments: NO rash to flank, abd  Neurological:     General: No focal deficit present.     Mental Status: She is alert and oriented to person, place, and time.  Psychiatric:        Mood and Affect: Mood normal.    ED Results / Procedures / Treatments   Labs (all labs ordered are listed, but only abnormal results are displayed) Labs Reviewed  URINALYSIS, ROUTINE W REFLEX MICROSCOPIC - Abnormal; Notable for the following components:      Result Value   APPearance HAZY (*)    Ketones, ur 15 (*)    All other components within normal limits  CBC WITH DIFFERENTIAL/PLATELET - Abnormal; Notable for the following components:   WBC 11.9 (*)    Neutro Abs 9.1 (*)    All other components within normal limits  COMPREHENSIVE METABOLIC PANEL - Abnormal; Notable for the following components:   Potassium 3.0 (*)    Glucose, Bld 110 (*)    Calcium 8.4 (*)    All other components within normal limits  PREGNANCY, URINE  LIPASE, BLOOD    EKG None  Radiology US Renal  Result Date: 02/17/2021 CLINICAL DATA:  Flank pain. EXAM: RENAL / URINARY TRACT ULTRASOUND COMPLETE COMPARISON:  CT scan 02/15/2021 FINDINGS: Right Kidney: Renal measurements: 11.4 x 6.5 x 7.0 cm = volume: 269.0 mL. Normal renal  cortical thickness and echogenicity without focal lesions or hydronephrosis. Left Kidney: Renal measurements: 12.7 x 6.4 x 5.4 cm = volume: 226 mL. Normal renal cortical thickness and echogenicity without focal lesions or hydronephrosis. Bladder: Appears normal for degree of bladder distention. Other: None. IMPRESSION: Unremarkable renal ultrasound examination. Electronically Signed   By: Rudie Meyer M.D.   On: 02/17/2021 18:50   DG Abdomen Acute W/Chest  Result Date: 02/17/2021 CLINICAL DATA:  Left abdominal pain, urinary tract infection EXAM: DG ABDOMEN ACUTE WITH 1 VIEW CHEST COMPARISON:  02/15/2021 CT scan FINDINGS: The lungs appear clear. Cardiac and mediastinal contours normal. No pleural effusion identified. 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 No dilated bowel. No abnormal air-fluid levels or free intraperitoneal gas identified. No significant abnormal calcifications in the abdomen noted. IMPRESSION: 1. No acute radiographic findings. 2. 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 Electronically Signed   By: Gaylyn Rong M.D.   On: 02/17/2021 17:16    Procedures Procedures   Medications Ordered in ED Medications  promethazine (PHENERGAN) 25 mg in sodium chloride 0.9 % 50 mL IVPB (0 mg Intravenous Stopped 02/17/21 1813)  promethazine (PHENERGAN) 25 MG/ML injection (  Not Given 02/17/21 1714)  sodium chloride 0.9 % bolus 1,000 mL (0 mLs Intravenous Stopped 02/17/21 1813)  morphine 4 MG/ML injection 4 mg (4 mg Intravenous Given 02/17/21 1652)  HYDROmorphone (DILAUDID) injection 1 mg (1 mg Intravenous Given 02/17/21 1809)  metoCLOPramide (REGLAN) injection 10 mg (10 mg Intravenous Given 02/17/21 1809)  potassium chloride 10 mEq in 100 mL IVPB (0 mEq Intravenous Stopped 02/17/21 2025)  ED Course  I have reviewed the triage vital signs and the nursing notes.  Pertinent labs & imaging results that were available during my care of the patient were reviewed by me and considered in my  medical decision making (see chart for details).  Here for evaluation of nausea, vomiting, abdominal pain and left flank pain.  Seen 2 days ago for similar complaints.  Diagnosed with UTI.  Has been unable to keep down medications at home.  Had CT scan 2 days ago which not show any significant abnormality.  Of note patient does have history of cannabinoid hyperemesis.  She has no diarrhea.  Multiple episodes of NBNB emesis.  No pelvic pain, vaginal discharge.  Patient is not concerned for any STDs.  Abdomen diffusely tender to left abdomen, positive CVA tap on left.  No chest pain, shortness of breath, she is PERC negative, Wells criteria low risk.  Low suspicion for atypical intrathoracic etiology of her symptom.  No focal right lower quadrant, left lower quadrant pain to suggest TOA, torsion, PID, AAA, dissection, rash to suggest zoster.  We will plan on recheck labs, symptomatic management, reassess  UA from 2 days ago, many bacteria, nitrite positive  Labs and imaging personally reviewed and interpreted:  CBC with leukocytosis at 11.9, improved from 2 days ago Metabolic panel potassium 3.0, will supplement, glucose 110, calcium 8.4, no additional electrolyte, renal or liver abnormality Pregnancy test negative Lipase 48 DG chest, abd without acute abnormality EKG with qtc 441, no ischemic changes US renal without acute abnormality   1740: Reassessed. Sx mildly improved. Refuses Haldol, Droperidol.   2045: Reassessed.  Pain improved.  She is tolerating p.o. intake.  Unclear if patient symptoms due to pyelonephritis given positive urine 2 days ago, symptoms.  Reassuring ultrasound.  She is passing flatus.  I have low suspicion for acute intra-abdominal surgical emergency.  Symptoms do not seem consistent with an atypical upper respiratory infection, PE, dissection.  Could possibly also be due to known cannabinoid hyperemesis.  Encouraged her to keep taking the antibiotics.  DC home with  symptomatic management.  Encouraged return for new or worsening symptoms.  Patient agreeable  Patient is nontoxic, nonseptic appearing, in no apparent distress.  Patient's pain and other symptoms adequately managed in emergency department.  Fluid bolus given.  Labs, imaging and vitals reviewed.  Patient does not meet the SIRS or Sepsis criteria.  On repeat exam patient does not have a surgical abdomin and there are no peritoneal signs.  No indication of appendicitis, bowel obstruction, bowel perforation, cholecystitis, diverticulitis, TOA, torsion PID or ectopic pregnancy.  Patient discharged home with symptomatic treatment and given strict instructions for follow-up with their primary care physician.  I have also discussed reasons to return immediately to the ER.  Patient expresses understanding and agrees with plan.       MDM Rules/Calculators/A&P                            Final Clinical Impression(s) / ED Diagnoses Final diagnoses:  Abdominal pain, unspecified abdominal location  Non-intractable vomiting with nausea, unspecified vomiting type  Pyelonephritis    Rx / DC Orders ED Discharge Orders          Ordered    oxyCODONE-acetaminophen (PERCOCET/ROXICET) 5-325 MG tablet  Every 6 hours PRN        02/17/21 2047    ondansetron (ZOFRAN ODT) 4 MG disintegrating tablet  Every 8 hours PRN  02/17/21 2047    metoCLOPramide (REGLAN) 10 MG tablet  Every 6 hours        02/17/21 2047                Seira Cody A, PA-C 02/17/21 2052    Milagros Loll, MD 02/20/21 2159

## 2021-02-17 NOTE — ED Triage Notes (Signed)
Pt reports was diagnosed with uti x2 days ago. Pt reports has been taking medication with no relief. Pt reports pain is worse and is tearful in triage.pt also reports emesis has continued.

## 2021-02-17 NOTE — ED Notes (Signed)
Pt given a small amount of ice water. Pt complaining of nausea.

## 2021-02-17 NOTE — Discharge Instructions (Signed)
Take the medications as prescribed, Reglan for nausea if this does not work you may take the Zofran.  I have also prescribed you with a short course of pain medicine.  Take as prescribed.  Do not drive or operate heavy machinery while taking this medication.  Continue taking the Keflex at home.  Make sure to rest, drink plenty fluids.  Return for new or worsening symptoms peer

## 2021-08-01 ENCOUNTER — Other Ambulatory Visit: Payer: Self-pay

## 2021-08-01 ENCOUNTER — Emergency Department (HOSPITAL_BASED_OUTPATIENT_CLINIC_OR_DEPARTMENT_OTHER)
Admission: EM | Admit: 2021-08-01 | Discharge: 2021-08-01 | Disposition: A | Discharge status: Home / Self Care | Payer: BC Managed Care – PPO | Attending: Emergency Medicine | Admitting: Emergency Medicine

## 2021-08-01 ENCOUNTER — Encounter (HOSPITAL_BASED_OUTPATIENT_CLINIC_OR_DEPARTMENT_OTHER): Payer: Self-pay

## 2021-08-01 DIAGNOSIS — R112 Nausea with vomiting, unspecified: Secondary | ICD-10-CM | POA: Diagnosis present

## 2021-08-01 DIAGNOSIS — R197 Diarrhea, unspecified: Secondary | ICD-10-CM | POA: Diagnosis not present

## 2021-08-01 DIAGNOSIS — Z20822 Contact with and (suspected) exposure to covid-19: Secondary | ICD-10-CM | POA: Diagnosis not present

## 2021-08-01 DIAGNOSIS — R0981 Nasal congestion: Secondary | ICD-10-CM | POA: Insufficient documentation

## 2021-08-01 DIAGNOSIS — R1084 Generalized abdominal pain: Secondary | ICD-10-CM | POA: Diagnosis not present

## 2021-08-01 DIAGNOSIS — Z79899 Other long term (current) drug therapy: Secondary | ICD-10-CM | POA: Diagnosis not present

## 2021-08-01 DIAGNOSIS — F1290 Cannabis use, unspecified, uncomplicated: Secondary | ICD-10-CM | POA: Diagnosis not present

## 2021-08-01 DIAGNOSIS — J3489 Other specified disorders of nose and nasal sinuses: Secondary | ICD-10-CM | POA: Diagnosis not present

## 2021-08-01 DIAGNOSIS — R059 Cough, unspecified: Secondary | ICD-10-CM | POA: Insufficient documentation

## 2021-08-01 LAB — URINALYSIS, ROUTINE W REFLEX MICROSCOPIC
Bilirubin Urine: NEGATIVE
Glucose, UA: 100 mg/dL — AB
Hgb urine dipstick: NEGATIVE
Ketones, ur: NEGATIVE mg/dL
Leukocytes,Ua: NEGATIVE
Nitrite: NEGATIVE
Protein, ur: 100 mg/dL — AB
Specific Gravity, Urine: 1.025 (ref 1.005–1.030)
pH: 8 (ref 5.0–8.0)

## 2021-08-01 LAB — CBC
HCT: 39.1 % (ref 36.0–46.0)
Hemoglobin: 13.9 g/dL (ref 12.0–15.0)
MCH: 32.3 pg (ref 26.0–34.0)
MCHC: 35.5 g/dL (ref 30.0–36.0)
MCV: 90.9 fL (ref 80.0–100.0)
Platelets: 252 10*3/uL (ref 150–400)
RBC: 4.3 MIL/uL (ref 3.87–5.11)
RDW: 12.1 % (ref 11.5–15.5)
WBC: 14.2 10*3/uL — ABNORMAL HIGH (ref 4.0–10.5)
nRBC: 0 % (ref 0.0–0.2)

## 2021-08-01 LAB — URINALYSIS, MICROSCOPIC (REFLEX)

## 2021-08-01 LAB — COMPREHENSIVE METABOLIC PANEL
ALT: 21 U/L (ref 0–44)
AST: 25 U/L (ref 15–41)
Albumin: 4.4 g/dL (ref 3.5–5.0)
Alkaline Phosphatase: 54 U/L (ref 38–126)
Anion gap: 8 (ref 5–15)
BUN: 12 mg/dL (ref 6–20)
CO2: 22 mmol/L (ref 22–32)
Calcium: 9.4 mg/dL (ref 8.9–10.3)
Chloride: 107 mmol/L (ref 98–111)
Creatinine, Ser: 0.76 mg/dL (ref 0.44–1.00)
GFR, Estimated: >60 mL/min (ref >60)
Glucose, Bld: 152 mg/dL — ABNORMAL HIGH (ref 70–99)
Potassium: 3.8 mmol/L (ref 3.5–5.1)
Sodium: 137 mmol/L (ref 135–145)
Total Bilirubin: 0.7 mg/dL (ref 0.3–1.2)
Total Protein: 8.6 g/dL — ABNORMAL HIGH (ref 6.5–8.1)

## 2021-08-01 LAB — RAPID URINE DRUG SCREEN, HOSP PERFORMED
Amphetamines: NOT DETECTED
Barbiturates: NOT DETECTED
Benzodiazepines: NOT DETECTED
Cocaine: NOT DETECTED
Opiates: NOT DETECTED
Tetrahydrocannabinol: POSITIVE — AB

## 2021-08-01 LAB — RESP PANEL BY RT-PCR (FLU A&B, COVID) ARPGX2
Influenza A by PCR: NEGATIVE
Influenza B by PCR: NEGATIVE
SARS Coronavirus 2 by RT PCR: NEGATIVE

## 2021-08-01 LAB — LIPASE, BLOOD: Lipase: 30 U/L (ref 11–51)

## 2021-08-01 LAB — PREGNANCY, URINE: Preg Test, Ur: NEGATIVE

## 2021-08-01 MED ORDER — SODIUM CHLORIDE 0.9 % IV SOLN
12.5000 mg | Freq: Four times a day (QID) | INTRAVENOUS | Status: DC | PRN
  Filled 2021-08-01: qty 0.5

## 2021-08-01 MED ORDER — ONDANSETRON HCL 8 MG PO TABS: 8.0000 mg | ORAL_TABLET | Freq: Three times a day (TID) | ORAL | 0 refills | Status: DC | PRN

## 2021-08-01 MED ORDER — SODIUM CHLORIDE 0.9 % IV BOLUS
1000.0000 mL | Freq: Once | INTRAVENOUS | Status: AC
  Administered 2021-08-01: 1000 mL via INTRAVENOUS

## 2021-08-01 MED ORDER — MORPHINE SULFATE (PF) 4 MG/ML IV SOLN
4.0000 mg | Freq: Once | INTRAVENOUS | Status: AC
  Administered 2021-08-01: 4 mg via INTRAVENOUS
  Filled 2021-08-01: qty 1

## 2021-08-01 MED ORDER — PROMETHAZINE HCL 25 MG/ML IJ SOLN
INTRAMUSCULAR | Status: AC
  Administered 2021-08-01: 25 mg
  Filled 2021-08-01: qty 1

## 2021-08-01 MED ORDER — ONDANSETRON HCL 4 MG/2ML IJ SOLN
4.0000 mg | Freq: Once | INTRAMUSCULAR | Status: AC
  Administered 2021-08-01: 4 mg via INTRAVENOUS
  Filled 2021-08-01: qty 2

## 2021-08-01 NOTE — ED Provider Notes (Signed)
[facility] EMERGENCY DEPARTMENT Provider Note   CSN: [id number] Arrival date & time: 08/01/21  1419     History  Chief Complaint  Patient presents with   Emesis    Tammy Andrews is a 31 y.o. female who has a past medical history of cannabinoid hyperemesis here for evaluation of nausea, vomiting, diarrhea which began today.  Has had multiple episodes of NBNB emesis.  Has not taken any medication.  She also has associated congestion, rhinorrhea, cough, generalized abdominal pain.  No melena or bright red per rectum.  No sick contacts.  No fever, headache, neck pain, back pain.  No pelvic pain, vaginal discharge, dysuria, hematuria.  Did drink alcohol yesterday evening as well as use marijuana.  No prior history of cannabinoid hyperemesis.  Has been seen previously for same however has been over 6 months since last visit.  Per chart review UDS is positive for THC on prior occasions.  HPI     Home Medications Prior to Admission medications   Medication Sig Start Date End Date Taking? Authorizing Provider  acetaminophen (TYLENOL) 500 MG tablet Take 1,000 mg by mouth every 6 (six) hours as needed for moderate pain.    [provider]  cephALEXin (KEFLEX) 500 MG capsule Take 1 capsule (500 mg total) by mouth 3 (three) times daily. 02/15/21   Isla Pence, MD  ibuprofen (ADVIL) 600 MG tablet Take 1 tablet (600 mg total) by mouth every 6 (six) hours as needed. 02/15/21   Isla Pence, MD  metoCLOPramide (REGLAN) 10 MG tablet Take 1 tablet (10 mg total) by mouth every 6 (six) hours. 02/17/21   Zaniya Mcaulay A, PA-C  ondansetron (ZOFRAN ODT) 4 MG disintegrating tablet Take 1 tablet (4 mg total) by mouth every 8 (eight) hours as needed for nausea or vomiting. 02/17/21   Pierrette Scheu A, PA-C  oxyCODONE-acetaminophen (PERCOCET/ROXICET) 5-325 MG tablet Take 1 tablet by mouth every 6 (six) hours as needed for severe pain. 02/17/21   Serrina Minogue A, PA-C  promethazine  (PHENERGAN) 25 MG tablet Take 1 tablet (25 mg total) by mouth every 8 (eight) hours as needed for nausea or vomiting. 02/15/21   Isla Pence, MD  haloperidol (HALDOL) 5 MG tablet Take 1 tablet (5 mg total) by mouth every 6 (six) hours as needed (nausea or vomiting). [id number]  Delora Fuel, MD      Allergies    Haldol [haloperidol lactate] and Pork-derived products    Review of Systems   Review of Systems  Constitutional: Negative.   HENT:  Positive for congestion and rhinorrhea.   Respiratory:  Positive for cough. Negative for shortness of breath.   Cardiovascular: Negative.   Gastrointestinal:  Positive for abdominal pain, diarrhea, nausea and vomiting. Negative for abdominal distention, anal bleeding, blood in stool, constipation and rectal pain.  Genitourinary: Negative.   Musculoskeletal: Negative.   Skin: Negative.   Neurological:  Positive for weakness (generalized).  All other systems reviewed and are negative.  Physical Exam Updated Vital Signs BP 122/80    Pulse 95    Temp 98.6 F (37 C) (Oral)    Resp 18    Ht 5\' 7"  (1.702 m)    Wt 108.9 kg    LMP 07/01/2021    SpO2 100%    BMI 37.59 kg/m  Physical Exam Vitals and nursing note reviewed.  Constitutional:      General: She is not in acute distress.    Appearance: She is well-developed. She is not  ill-appearing, toxic-appearing or diaphoretic.  HENT:     Head: Normocephalic and atraumatic.     Nose: Nose normal.     Mouth/Throat:     Mouth: Mucous membranes are moist.  Eyes:     Pupils: Pupils are equal, round, and reactive to light.  Cardiovascular:     Rate and Rhythm: Tachycardia present.     Pulses: Normal pulses.     Heart sounds: Normal heart sounds.  Pulmonary:     Effort: Pulmonary effort is normal. No respiratory distress.     Breath sounds: Normal breath sounds.  Abdominal:     General: Bowel sounds are normal. There is no distension.     Palpations: Abdomen is soft.     Tenderness: There is  no abdominal tenderness. There is no right CVA tenderness, left CVA tenderness or guarding.  Musculoskeletal:        General: No swelling, tenderness, deformity or signs of injury. Normal range of motion.     Cervical back: Normal range of motion.  Skin:    General: Skin is warm and dry.     Capillary Refill: Capillary refill takes less than 2 seconds.  Neurological:     General: No focal deficit present.     Mental Status: She is alert and oriented to person, place, and time.  Psychiatric:        Mood and Affect: Mood normal.    ED Results / Procedures / Treatments   Labs (all labs ordered are listed, but only abnormal results are displayed) Labs Reviewed  COMPREHENSIVE METABOLIC PANEL - Abnormal; Notable for the following components:      Result Value   Glucose, Bld 152 (*)    Total Protein 8.6 (*)    All other components within normal limits  CBC - Abnormal; Notable for the following components:   WBC 14.2 (*)    All other components within normal limits  URINALYSIS, ROUTINE W REFLEX MICROSCOPIC - Abnormal; Notable for the following components:   Glucose, UA 100 (*)    Protein, ur 100 (*)    All other components within normal limits  URINALYSIS, MICROSCOPIC (REFLEX) - Abnormal; Notable for the following components:   Bacteria, UA RARE (*)    All other components within normal limits  RAPID URINE DRUG SCREEN, HOSP PERFORMED - Abnormal; Notable for the following components:   Tetrahydrocannabinol POSITIVE (*)    All other components within normal limits  RESP PANEL BY RT-PCR (FLU A&B, COVID) ARPGX2  LIPASE, BLOOD  PREGNANCY, URINE    EKG EKG Interpretation  Date/Time:  Sunday August 01 2021 18:29:51 EST Ventricular Rate:  95 PR Interval:  147 QRS Duration: 108 QT Interval:  367 QTC Calculation: 462 R Axis:   81 Text Interpretation: Sinus rhythm When compared to prior, similar appearance. no convincing evidence of delta wave. No STEMI Confirmed by Antony Blackbird  [phone]) on 08/01/2021 6:45:43 PM  Radiology No results found.  Procedures Procedures    Medications Ordered in ED Medications  promethazine (PHENERGAN) 12.5 mg in sodium chloride 0.9 % 50 mL IVPB (has no administration in time range)  sodium chloride 0.9 % bolus 1,000 mL (1,000 mLs Intravenous New Bag/Given 08/01/21 1856)  ondansetron (ZOFRAN) injection 4 mg (4 mg Intravenous Given 08/01/21 1858)  morphine 4 MG/ML injection 4 mg (4 mg Intravenous Given 08/01/21 1859)   ED Course/ Medical Decision Making/ A&P    31 year old here for evaluation and/V/D began this morning.  History of similar.  Is  a marijuana user.  No urinary complaints.  No focal abdominal pain, abdomen soft, nontender on exam.  Does admit to a cough, congestion, rhinorrhea.  She is nonfocal neuro exam without deficits.  Heart and lungs clear.  She is very tearful in room.  Work-up started from triage which I personally reviewed and interpreted:  COVID/Flu neg UDS positive for THC, similar to prior UDs Lipase 30 UA neg for infection CBC with leukocytosis at 14.2 CMP mild hyperglycemia at 152 Preg neg EKG without ischemia. QTC 462  Patient reassessed. Still having some nausea. Getting IVF. Will plan on reassessment.  Patient reassessed. Dry heaving in room. Declined haldol and droperidol. Will attempt phenergan.  Care transferred to [place], [place] who will FU on reassessment and determine disposition.                          Medical Decision Making Amount and/or Complexity of Data Reviewed External Data Reviewed: labs, radiology and notes. Labs: ordered. Decision-making details documented in ED Course.  Risk OTC drugs. Prescription drug management. Parenteral controlled substances.         Final Clinical Impression(s) / ED Diagnoses Final diagnoses:  Nausea vomiting and diarrhea  Marijuana use    Rx / DC Orders ED Discharge Orders     None         Cordero Surette A, PA-C 08/01/21 1941     Tegeler, Gwenyth Allegra, MD 08/01/21 2155

## 2021-08-01 NOTE — ED Notes (Signed)
Patient discharged to home.  All discharge instructions reviewed.  Patient verbalized understanding via teachback method.  VS WDL.  Respirations even and unlabored.  Ambulatory out of ED.   °

## 2021-08-01 NOTE — Discharge Instructions (Addendum)
Take Zofran every 8 hours as nee try quitting marijuana for 6 months and see if that makes the symptoms go away.  It may be related to something called cannabis hyperemesis, if you are still having the symptoms despite abstaining from marijuana for a full 6 months then it is unrelated.  Follow-up with your primary the symptoms continue.  ded for nausea and vomiting.

## 2021-08-01 NOTE — ED Triage Notes (Signed)
Pt arrives with c/o NVD starting this morning vomiting 20x in the last 24 hours. Denies taking any medication at home for vomiting.

## 2021-08-02 ENCOUNTER — Other Ambulatory Visit: Payer: Self-pay

## 2021-08-02 ENCOUNTER — Encounter (HOSPITAL_BASED_OUTPATIENT_CLINIC_OR_DEPARTMENT_OTHER): Payer: Self-pay | Admitting: *Deleted

## 2021-08-02 ENCOUNTER — Emergency Department (HOSPITAL_BASED_OUTPATIENT_CLINIC_OR_DEPARTMENT_OTHER)
Admission: EM | Admit: 2021-08-02 | Discharge: 2021-08-02 | Disposition: A | Discharge status: Home / Self Care | Payer: BC Managed Care – PPO | Attending: Emergency Medicine | Admitting: Emergency Medicine

## 2021-08-02 DIAGNOSIS — R112 Nausea with vomiting, unspecified: Secondary | ICD-10-CM | POA: Insufficient documentation

## 2021-08-02 DIAGNOSIS — E876 Hypokalemia: Secondary | ICD-10-CM | POA: Insufficient documentation

## 2021-08-02 DIAGNOSIS — R1084 Generalized abdominal pain: Secondary | ICD-10-CM | POA: Insufficient documentation

## 2021-08-02 DIAGNOSIS — Z79899 Other long term (current) drug therapy: Secondary | ICD-10-CM | POA: Insufficient documentation

## 2021-08-02 LAB — URINALYSIS, MICROSCOPIC (REFLEX)

## 2021-08-02 LAB — URINALYSIS, ROUTINE W REFLEX MICROSCOPIC
Bilirubin Urine: NEGATIVE
Glucose, UA: NEGATIVE mg/dL
Hgb urine dipstick: NEGATIVE
Ketones, ur: NEGATIVE mg/dL
Leukocytes,Ua: NEGATIVE
Nitrite: NEGATIVE
Protein, ur: 30 mg/dL — AB
Specific Gravity, Urine: >=1.03 (ref 1.005–1.030)
pH: 6 (ref 5.0–8.0)

## 2021-08-02 LAB — COMPREHENSIVE METABOLIC PANEL
ALT: 21 U/L (ref 0–44)
AST: 24 U/L (ref 15–41)
Albumin: 4.2 g/dL (ref 3.5–5.0)
Alkaline Phosphatase: 50 U/L (ref 38–126)
Anion gap: 10 (ref 5–15)
BUN: 12 mg/dL (ref 6–20)
CO2: 22 mmol/L (ref 22–32)
Calcium: 9.5 mg/dL (ref 8.9–10.3)
Chloride: 103 mmol/L (ref 98–111)
Creatinine, Ser: 0.83 mg/dL (ref 0.44–1.00)
GFR, Estimated: >60 mL/min (ref >60)
Glucose, Bld: 128 mg/dL — ABNORMAL HIGH (ref 70–99)
Potassium: 3.2 mmol/L — ABNORMAL LOW (ref 3.5–5.1)
Sodium: 135 mmol/L (ref 135–145)
Total Bilirubin: 0.9 mg/dL (ref 0.3–1.2)
Total Protein: 8.1 g/dL (ref 6.5–8.1)

## 2021-08-02 LAB — CBC
HCT: 39 % (ref 36.0–46.0)
Hemoglobin: 13.8 g/dL (ref 12.0–15.0)
MCH: 32.3 pg (ref 26.0–34.0)
MCHC: 35.4 g/dL (ref 30.0–36.0)
MCV: 91.3 fL (ref 80.0–100.0)
Platelets: 249 10*3/uL (ref 150–400)
RBC: 4.27 MIL/uL (ref 3.87–5.11)
RDW: 12.2 % (ref 11.5–15.5)
WBC: 11.9 10*3/uL — ABNORMAL HIGH (ref 4.0–10.5)
nRBC: 0 % (ref 0.0–0.2)

## 2021-08-02 LAB — HCG, SERUM, QUALITATIVE: Preg, Serum: NEGATIVE

## 2021-08-02 LAB — LIPASE, BLOOD: Lipase: 40 U/L (ref 11–51)

## 2021-08-02 LAB — PREGNANCY, URINE: Preg Test, Ur: NEGATIVE

## 2021-08-02 MED ORDER — FAMOTIDINE IN NACL 20-0.9 MG/50ML-% IV SOLN
20.0000 mg | Freq: Once | INTRAVENOUS | Status: AC
  Administered 2021-08-02: 20 mg via INTRAVENOUS
  Filled 2021-08-02: qty 50

## 2021-08-02 MED ORDER — DROPERIDOL 2.5 MG/ML IJ SOLN
1.2500 mg | Freq: Once | INTRAMUSCULAR | Status: AC
  Administered 2021-08-02: 1.25 mg via INTRAVENOUS
  Filled 2021-08-02: qty 2

## 2021-08-02 MED ORDER — ONDANSETRON HCL 4 MG/2ML IJ SOLN
4.0000 mg | Freq: Once | INTRAMUSCULAR | Status: AC
  Administered 2021-08-02: 4 mg via INTRAVENOUS
  Filled 2021-08-02: qty 2

## 2021-08-02 MED ORDER — SODIUM CHLORIDE 0.9 % IV BOLUS
1000.0000 mL | Freq: Once | INTRAVENOUS | Status: AC
  Administered 2021-08-02: 1000 mL via INTRAVENOUS

## 2021-08-02 MED ORDER — POTASSIUM CHLORIDE CRYS ER 20 MEQ PO TBCR
40.0000 meq | EXTENDED_RELEASE_TABLET | Freq: Once | ORAL | Status: AC
  Administered 2021-08-02: 40 meq via ORAL
  Filled 2021-08-02: qty 2

## 2021-08-02 MED ORDER — PROMETHAZINE HCL 25 MG PO TABS: 25.0000 mg | ORAL_TABLET | Freq: Four times a day (QID) | ORAL | 0 refills | Status: DC | PRN

## 2021-08-02 MED ORDER — HYDROMORPHONE HCL 1 MG/ML IJ SOLN
0.5000 mg | Freq: Once | INTRAMUSCULAR | Status: AC
  Administered 2021-08-02: 0.5 mg via INTRAVENOUS
  Filled 2021-08-02: qty 1

## 2021-08-02 MED ORDER — CAPSAICIN 0.025 % EX CREA
TOPICAL_CREAM | CUTANEOUS | Status: AC
Start: 2021-08-02 — End: 2021-08-02
  Filled 2021-08-02: qty 60

## 2021-08-02 NOTE — Discharge Instructions (Addendum)
Please use Phenergan as needed for nausea.  Drink plenty of water.  I recommend bland foods such as rice, applesauce, toast, bananas.  Your potassium was marginally low today.  You need to have your electrolytes rechecked in a week or so.  I recommend refraining from using marijuana as it certainly could be causing severe nausea or vomiting.  Please follow-up with a primary care doctor given you the information for the [facility] health and wellness clinic.

## 2021-08-02 NOTE — ED Triage Notes (Addendum)
C/o cont nausea/ vomiting , seen here yesterday for same, did not get prescription filled ,she reports marijuana helps with here nausea , instructed pt that her DX yesterday was Mariajuana induced vomiting pt states she needs to lay down , informed pt that we had no rooms at this time and there was a little wait, pt states " if I was white then you would have a room for me "

## 2021-08-02 NOTE — ED Provider Notes (Signed)
[facility] EMERGENCY DEPARTMENT Provider Note   CSN: [id number] Arrival date & time: 08/02/21  1303     History  Chief Complaint  Patient presents with   Vomiting    Tammy Andrews is a 31 y.o. female.  HPI  Patient is a 31 year old female presenting today for nausea vomiting she describes as nonbloody nonbilious states that she vomited 3 times today and states that she had nothing else left in her department.  She states she was seen yesterday in emergency room but left also feeling nauseous because she had things she needed to do.  She states that her symptoms gradually returned.  She has not been able to tolerate p.o. and therefore has not been able to take any medications.  She denies any chest pain or difficulty breathing fevers and states that she is still peeing and pooping.  She denies any urinary symptoms such as urinary frequency urgency dysuria.  No hematuria.  Notably patient states that she has been told in the past that her vomiting is due to marijuana use.  She states that she continues to smoke just every day.     Home Medications Prior to Admission medications   Medication Sig Start Date End Date Taking? Authorizing Provider  promethazine (PHENERGAN) 25 MG tablet Take 1 tablet (25 mg total) by mouth every 6 (six) hours as needed for nausea or vomiting. 08/02/21  Yes Chavie Kolinski, Ova Freshwater S, PA  acetaminophen (TYLENOL) 500 MG tablet Take 1,000 mg by mouth every 6 (six) hours as needed for moderate pain.    [provider]  cephALEXin (KEFLEX) 500 MG capsule Take 1 capsule (500 mg total) by mouth 3 (three) times daily. 02/15/21   Isla Pence, MD  ibuprofen (ADVIL) 600 MG tablet Take 1 tablet (600 mg total) by mouth every 6 (six) hours as needed. 02/15/21   Isla Pence, MD  metoCLOPramide (REGLAN) 10 MG tablet Take 1 tablet (10 mg total) by mouth every 6 (six) hours. 02/17/21   Henderly, Britni A, PA-C  ondansetron (ZOFRAN ODT) 4 MG disintegrating  tablet Take 1 tablet (4 mg total) by mouth every 8 (eight) hours as needed for nausea or vomiting. 02/17/21   Henderly, Britni A, PA-C  ondansetron (ZOFRAN) 8 MG tablet Take 1 tablet (8 mg total) by mouth every 8 (eight) hours as needed for nausea or vomiting. 08/01/21   Sherrill Raring, PA-C  oxyCODONE-acetaminophen (PERCOCET/ROXICET) 5-325 MG tablet Take 1 tablet by mouth every 6 (six) hours as needed for severe pain. 02/17/21   Henderly, Britni A, PA-C  promethazine (PHENERGAN) 25 MG tablet Take 1 tablet (25 mg total) by mouth every 8 (eight) hours as needed for nausea or vomiting. 02/15/21   Isla Pence, MD  haloperidol (HALDOL) 5 MG tablet Take 1 tablet (5 mg total) by mouth every 6 (six) hours as needed (nausea or vomiting). [id number]  Delora Fuel, MD      Allergies    Haldol [haloperidol lactate] and Pork-derived products    Review of Systems   Review of Systems  Constitutional:  Negative for chills and fever.  HENT:  Negative for congestion.   Eyes:  Negative for pain.  Respiratory:  Negative for cough and shortness of breath.   Cardiovascular:  Negative for chest pain and leg swelling.  Gastrointestinal:  Positive for abdominal pain, nausea and vomiting. Negative for diarrhea.  Genitourinary:  Negative for dysuria.  Musculoskeletal:  Negative for myalgias.  Skin:  Negative for rash.  Neurological:  Negative  for dizziness and headaches.   Physical Exam Updated Vital Signs BP 109/62 (BP Location: Right Arm)    Pulse 79    Temp 97.8 F (36.6 C) (Oral)    Resp 15    LMP 07/01/2021    SpO2 100%  Physical Exam  ED Results / Procedures / Treatments   Labs (all labs ordered are listed, but only abnormal results are displayed) Labs Reviewed  COMPREHENSIVE METABOLIC PANEL - Abnormal; Notable for the following components:      Result Value   Potassium 3.2 (*)    Glucose, Bld 128 (*)    All other components within normal limits  CBC - Abnormal; Notable for the following  components:   WBC 11.9 (*)    All other components within normal limits  URINALYSIS, ROUTINE W REFLEX MICROSCOPIC - Abnormal; Notable for the following components:   Protein, ur 30 (*)    All other components within normal limits  URINALYSIS, MICROSCOPIC (REFLEX) - Abnormal; Notable for the following components:   Bacteria, UA RARE (*)    All other components within normal limits  LIPASE, BLOOD  PREGNANCY, URINE  HCG, SERUM, QUALITATIVE    EKG None  Radiology No results found.  Procedures Procedures    Medications Ordered in ED Medications  sodium chloride 0.9 % bolus 1,000 mL (0 mLs Intravenous Stopped 08/02/21 1639)  famotidine (PEPCID) IVPB 20 mg premix (0 mg Intravenous Stopped 08/02/21 1639)  droperidol (INAPSINE) 2.5 MG/ML injection 1.25 mg (1.25 mg Intravenous Given 08/02/21 1535)  capsaicin (ZOSTRIX) 0.025 % cream ( Topical Given 08/02/21 1536)  HYDROmorphone (DILAUDID) injection 0.5 mg (0.5 mg Intravenous Given 08/02/21 1626)  ondansetron (ZOFRAN) injection 4 mg (4 mg Intravenous Given 08/02/21 1628)  potassium chloride SA (KLOR-CON M) CR tablet 40 mEq (40 mEq Oral Given 08/02/21 1724)    ED Course/ Medical Decision Making/ A&P Clinical Course as of 08/02/21 1750  Mon Aug 02, 2021  1502 EKG yesterday reviewed QT/QTcB 367/462 ms [WF]  1744 Patient much improved on my reevaluation and states her pain is nearly 0 and she is tolerating p.o. and was able to keep down p.o. potassium. [WF]    Clinical Course User Index [WF] Tedd Sias, [place]                           Medical Decision Making   Patient is a 31 year old female presenting today for nausea vomiting she describes as nonbloody nonbilious states that she vomited 3 times today and states that she had nothing else left in her department.  She states she was seen yesterday in emergency room but left also feeling nauseous because she had things she needed to do.  She states that her symptoms gradually returned.   She has not been able to tolerate p.o. and therefore has not been able to take any medications.  She denies any chest pain or difficulty breathing fevers and states that she is still peeing and pooping.  She denies any urinary symptoms such as urinary frequency urgency dysuria.  No hematuria.  Notably patient states that she has been told in the past that her vomiting is due to marijuana use.  She states that she continues to smoke just every day.   This patient presents to the ED for concern of nausea vomiting abdominal pain, this involves a number of treatment options, and is a complaint that carries with it a high risk of complications and morbidity.  The differential diagnosis includes intra-abdominal infection, perforation, obstruction such as LBO or SBO.  Also metabolic disease such as DKA.  This may also be due to marijuana hyperemesis.   Co morbidities: Discussed in HPI   Additional history: Prior records.  Seems that she has had consistently positive THC in urine  External records from outside source obtained and reviewed including outside emergency room visits   Lab Tests:  I ordered, and personally interpreted labs.  The pertinent results include:    Labs with significant abnormalities  Patient with hypokalemia was repleted here p.o.  CBC with mild leukocytosis.  Urinalysis unremarkable urine pregnancy test negative.  Lipase within normal limits  Imaging Studies:  None   Cardiac Monitoring:  In a   Medicines ordered:  I ordered medication including p.o. potassium, Pepcid, IV fluids, Zofran, Dilaudid, capsaicin directly applied abdomen and droperidol, for nausea vomiting abdominal pain Reevaluation of the patient after these medicines showed that the patient improved I have reviewed the patients home medicines and have made adjustments as needed   Test Considered:  CT imaging of the abdomen.  I reviewed prior CT scans.  We will hold off on imaging at this  time.   Critical Interventions:  Medications   Consults:  None    Reevaluation:  After the interventions noted above, I reevaluated the patient and found that they have :improved   Social Determinants of Health:  No barriers to discharge   Dispostion:  After consideration of the diagnostic results and the patients response to treatment, I feel that the patent would benefit from discharge home with close follow-up with PCP.  Will give p.o. antiemetics to use at home and return precautions.    Counseled on marijuana cessation as she has had persistently positive THC in urine and endorses daily continued use of marijuana.  She understands that this very well may be causing some of her persistent nausea and vomiting.  Is agreeable to plan to cease using.   Final Clinical Impression(s) / ED Diagnoses Final diagnoses:  Nausea and vomiting, unspecified vomiting type  Generalized abdominal pain  Hypokalemia    Rx / DC Orders ED Discharge Orders          Ordered    promethazine (PHENERGAN) 25 MG tablet  Every 6 hours PRN        08/02/21 1739              Tedd Sias, [place] 08/02/21 1751    Lucrezia Starch, MD 08/02/21 2143

## 2021-08-03 ENCOUNTER — Encounter (HOSPITAL_COMMUNITY): Payer: Self-pay

## 2021-08-03 ENCOUNTER — Other Ambulatory Visit: Payer: Self-pay

## 2021-08-03 ENCOUNTER — Emergency Department (HOSPITAL_COMMUNITY)
Admission: EM | Admit: 2021-08-03 | Discharge: 2021-08-03 | Disposition: A | Discharge status: Home / Self Care | Payer: BC Managed Care – PPO | Source: Home / Self Care | Attending: Emergency Medicine | Admitting: Emergency Medicine

## 2021-08-03 ENCOUNTER — Emergency Department (HOSPITAL_COMMUNITY): Payer: BC Managed Care – PPO

## 2021-08-03 ENCOUNTER — Emergency Department (HOSPITAL_COMMUNITY)
Admission: EM | Admit: 2021-08-03 | Discharge: 2021-08-03 | Disposition: A | Discharge status: Home / Self Care | Payer: BC Managed Care – PPO | Attending: Emergency Medicine | Admitting: Emergency Medicine

## 2021-08-03 DIAGNOSIS — R110 Nausea: Secondary | ICD-10-CM | POA: Insufficient documentation

## 2021-08-03 DIAGNOSIS — R112 Nausea with vomiting, unspecified: Secondary | ICD-10-CM | POA: Insufficient documentation

## 2021-08-03 DIAGNOSIS — M549 Dorsalgia, unspecified: Secondary | ICD-10-CM | POA: Insufficient documentation

## 2021-08-03 DIAGNOSIS — R197 Diarrhea, unspecified: Secondary | ICD-10-CM | POA: Insufficient documentation

## 2021-08-03 DIAGNOSIS — R1084 Generalized abdominal pain: Secondary | ICD-10-CM

## 2021-08-03 DIAGNOSIS — G8929 Other chronic pain: Secondary | ICD-10-CM | POA: Diagnosis not present

## 2021-08-03 DIAGNOSIS — Z5321 Procedure and treatment not carried out due to patient leaving prior to being seen by health care provider: Secondary | ICD-10-CM | POA: Insufficient documentation

## 2021-08-03 LAB — CBC
HCT: 39.3 % (ref 36.0–46.0)
HCT: 39.2 % (ref 36.0–46.0)
Hemoglobin: 13.5 g/dL (ref 12.0–15.0)
Hemoglobin: 13.4 g/dL (ref 12.0–15.0)
MCH: 31.8 pg (ref 26.0–34.0)
MCH: 31.9 pg (ref 26.0–34.0)
MCHC: 34.4 g/dL (ref 30.0–36.0)
MCHC: 34.2 g/dL (ref 30.0–36.0)
MCV: 92.7 fL (ref 80.0–100.0)
MCV: 93.3 fL (ref 80.0–100.0)
Platelets: 251 10*3/uL (ref 150–400)
Platelets: 250 10*3/uL (ref 150–400)
RBC: 4.24 MIL/uL (ref 3.87–5.11)
RBC: 4.2 MIL/uL (ref 3.87–5.11)
RDW: 12.2 % (ref 11.5–15.5)
RDW: 12.3 % (ref 11.5–15.5)
WBC: 11.3 10*3/uL — ABNORMAL HIGH (ref 4.0–10.5)
WBC: 12.6 10*3/uL — ABNORMAL HIGH (ref 4.0–10.5)
nRBC: 0 % (ref 0.0–0.2)
nRBC: 0 % (ref 0.0–0.2)

## 2021-08-03 LAB — URINALYSIS, ROUTINE W REFLEX MICROSCOPIC
Bilirubin Urine: NEGATIVE
Glucose, UA: NEGATIVE mg/dL
Hgb urine dipstick: NEGATIVE
Ketones, ur: 5 mg/dL — AB
Nitrite: NEGATIVE
Protein, ur: NEGATIVE mg/dL
Specific Gravity, Urine: >1.046 — ABNORMAL HIGH (ref 1.005–1.030)
pH: 5 (ref 5.0–8.0)

## 2021-08-03 LAB — COMPREHENSIVE METABOLIC PANEL
ALT: 18 U/L (ref 0–44)
ALT: 20 U/L (ref 0–44)
AST: 21 U/L (ref 15–41)
AST: 26 U/L (ref 15–41)
Albumin: 3.9 g/dL (ref 3.5–5.0)
Albumin: 4 g/dL (ref 3.5–5.0)
Alkaline Phosphatase: 48 U/L (ref 38–126)
Alkaline Phosphatase: 47 U/L (ref 38–126)
Anion gap: 8 (ref 5–15)
Anion gap: 7 (ref 5–15)
BUN: 13 mg/dL (ref 6–20)
BUN: 16 mg/dL (ref 6–20)
CO2: 23 mmol/L (ref 22–32)
CO2: 21 mmol/L — ABNORMAL LOW (ref 22–32)
Calcium: 9.1 mg/dL (ref 8.9–10.3)
Calcium: 9 mg/dL (ref 8.9–10.3)
Chloride: 110 mmol/L (ref 98–111)
Chloride: 108 mmol/L (ref 98–111)
Creatinine, Ser: 0.93 mg/dL (ref 0.44–1.00)
Creatinine, Ser: 0.87 mg/dL (ref 0.44–1.00)
GFR, Estimated: >60 mL/min (ref >60)
GFR, Estimated: >60 mL/min (ref >60)
Glucose, Bld: 111 mg/dL — ABNORMAL HIGH (ref 70–99)
Glucose, Bld: 111 mg/dL — ABNORMAL HIGH (ref 70–99)
Potassium: 3.6 mmol/L (ref 3.5–5.1)
Potassium: 4 mmol/L (ref 3.5–5.1)
Sodium: 141 mmol/L (ref 135–145)
Sodium: 136 mmol/L (ref 135–145)
Total Bilirubin: 0.7 mg/dL (ref 0.3–1.2)
Total Bilirubin: 1 mg/dL (ref 0.3–1.2)
Total Protein: 7.6 g/dL (ref 6.5–8.1)
Total Protein: 7.9 g/dL (ref 6.5–8.1)

## 2021-08-03 LAB — RAPID URINE DRUG SCREEN, HOSP PERFORMED
Amphetamines: NOT DETECTED
Barbiturates: NOT DETECTED
Benzodiazepines: NOT DETECTED
Cocaine: NOT DETECTED
Opiates: POSITIVE — AB
Tetrahydrocannabinol: POSITIVE — AB

## 2021-08-03 LAB — I-STAT BETA HCG BLOOD, ED (MC, WL, AP ONLY)
I-stat hCG, quantitative: <5 m[IU]/mL (ref <5)
I-stat hCG, quantitative: <5 m[IU]/mL (ref <5)

## 2021-08-03 LAB — LIPASE, BLOOD
Lipase: 48 U/L (ref 11–51)
Lipase: 39 U/L (ref 11–51)

## 2021-08-03 MED ORDER — SODIUM CHLORIDE 0.9 % IV SOLN
6.2500 mg | Freq: Once | INTRAVENOUS | Status: AC
  Administered 2021-08-03: 6.25 mg via INTRAVENOUS
  Filled 2021-08-03 (×4): qty 0.25

## 2021-08-03 MED ORDER — ONDANSETRON 4 MG PO TBDP
4.0000 mg | ORAL_TABLET | Freq: Once | ORAL | Status: AC
  Administered 2021-08-03: 4 mg via ORAL
  Filled 2021-08-03: qty 1

## 2021-08-03 MED ORDER — HYDROMORPHONE HCL 1 MG/ML IJ SOLN
0.5000 mg | Freq: Once | INTRAMUSCULAR | Status: AC
Start: 2021-08-03 — End: 2021-08-03
  Administered 2021-08-03: 0.5 mg via INTRAVENOUS
  Filled 2021-08-03: qty 1

## 2021-08-03 MED ORDER — IOHEXOL 300 MG/ML  SOLN
100.0000 mL | Freq: Once | INTRAMUSCULAR | Status: AC | PRN
  Administered 2021-08-03: 100 mL via INTRAVENOUS

## 2021-08-03 MED ORDER — DROPERIDOL 2.5 MG/ML IJ SOLN
1.2500 mg | Freq: Once | INTRAMUSCULAR | Status: AC
  Administered 2021-08-03: 1.25 mg via INTRAVENOUS
  Filled 2021-08-03: qty 2

## 2021-08-03 MED ORDER — LORAZEPAM 2 MG/ML IJ SOLN
0.5000 mg | Freq: Once | INTRAMUSCULAR | Status: AC
  Administered 2021-08-03: 0.5 mg via INTRAVENOUS
  Filled 2021-08-03: qty 1

## 2021-08-03 MED ORDER — SODIUM CHLORIDE (PF) 0.9 % IJ SOLN
INTRAMUSCULAR | Status: AC
  Filled 2021-08-03: qty 50

## 2021-08-03 NOTE — ED Notes (Signed)
Pt in bed, pt reports a slight decrease in her pain, states that her nausea is better, requests more pain med.

## 2021-08-03 NOTE — ED Triage Notes (Signed)
Pt. Stated, Ive had Nausea and back pain for 2 days.

## 2021-08-03 NOTE — ED Notes (Signed)
Pt in bed, pt reports some decrease in pain.

## 2021-08-03 NOTE — ED Notes (Signed)
Pt in bed with eyes closed, pt arouses to verbal stim.

## 2021-08-03 NOTE — ED Provider Notes (Signed)
[facility]-EMERGENCY DEPT Provider Note   CSN: [id number] Arrival date & time: 08/03/21  0802     History  Chief Complaint  Patient presents with   Abdominal Pain   Back Pain   Emesis   Diarrhea    Natascha Edmonds is a 31 y.o. female.  31 year old female with prior medical history as detailed below presents with complaint of abdominal pain.  Patient with frequent presentations for similar complaint.  Patient reports associated nausea, vomiting, diarrhea.  She denies fever.  The history is provided by the patient and medical records.  Abdominal Pain Pain location:  Generalized Pain quality: aching and cramping   Pain radiates to:  Does not radiate Pain severity:  Moderate Onset quality:  Gradual Duration:  2 days Timing:  Constant Progression:  Waxing and waning Chronicity:  Recurrent Associated symptoms: diarrhea and vomiting   Back Pain Associated symptoms: abdominal pain   Emesis Associated symptoms: abdominal pain and diarrhea   Diarrhea Associated symptoms: abdominal pain and vomiting       Home Medications Prior to Admission medications   Medication Sig Start Date End Date Taking? Authorizing Provider  acetaminophen (TYLENOL) 500 MG tablet Take 1,000 mg by mouth every 6 (six) hours as needed for moderate pain.    [provider]  cephALEXin (KEFLEX) 500 MG capsule Take 1 capsule (500 mg total) by mouth 3 (three) times daily. 02/15/21   Jacalyn Lefevre, MD  ibuprofen (ADVIL) 600 MG tablet Take 1 tablet (600 mg total) by mouth every 6 (six) hours as needed. 02/15/21   Jacalyn Lefevre, MD  metoCLOPramide (REGLAN) 10 MG tablet Take 1 tablet (10 mg total) by mouth every 6 (six) hours. 02/17/21   Henderly, Britni A, PA-C  ondansetron (ZOFRAN ODT) 4 MG disintegrating tablet Take 1 tablet (4 mg total) by mouth every 8 (eight) hours as needed for nausea or vomiting. 02/17/21   Henderly, Britni A, PA-C  ondansetron (ZOFRAN) 8 MG tablet Take 1  tablet (8 mg total) by mouth every 8 (eight) hours as needed for nausea or vomiting. 08/01/21   Theron Arista, PA-C  oxyCODONE-acetaminophen (PERCOCET/ROXICET) 5-325 MG tablet Take 1 tablet by mouth every 6 (six) hours as needed for severe pain. 02/17/21   Henderly, Britni A, PA-C  promethazine (PHENERGAN) 25 MG tablet Take 1 tablet (25 mg total) by mouth every 8 (eight) hours as needed for nausea or vomiting. 02/15/21   Jacalyn Lefevre, MD  promethazine (PHENERGAN) 25 MG tablet Take 1 tablet (25 mg total) by mouth every 6 (six) hours as needed for nausea or vomiting. 08/02/21   Gailen Shelter, PA  haloperidol (HALDOL) 5 MG tablet Take 1 tablet (5 mg total) by mouth every 6 (six) hours as needed (nausea or vomiting). 06/17/19 06/18/19  Dione Booze, MD      Allergies    Haldol [haloperidol lactate] and Pork-derived products    Review of Systems   Review of Systems  Gastrointestinal:  Positive for abdominal pain, diarrhea and vomiting.  Musculoskeletal:  Positive for back pain.  All other systems reviewed and are negative.  Physical Exam Updated Vital Signs BP 117/61    Pulse 72    Temp 98.9 F (37.2 C) (Oral)    Resp 16    Ht 5\' 7"  (1.702 m)    Wt 108.9 kg    LMP 08/01/2021    SpO2 98%    BMI 37.59 kg/m  Physical Exam Vitals and nursing note reviewed.  Constitutional:  General: She is not in acute distress.    Appearance: Normal appearance. She is well-developed.  HENT:     Head: Normocephalic and atraumatic.  Eyes:     Conjunctiva/sclera: Conjunctivae normal.     Pupils: Pupils are equal, round, and reactive to light.  Cardiovascular:     Rate and Rhythm: Normal rate and regular rhythm.     Heart sounds: Normal heart sounds.  Pulmonary:     Effort: Pulmonary effort is normal. No respiratory distress.     Breath sounds: Normal breath sounds.  Abdominal:     General: There is no distension.     Palpations: Abdomen is soft.     Tenderness: There is generalized abdominal tenderness.   Musculoskeletal:        General: No deformity. Normal range of motion.     Cervical back: Normal range of motion and neck supple.  Skin:    General: Skin is warm and dry.  Neurological:     General: No focal deficit present.     Mental Status: She is alert and oriented to person, place, and time.    ED Results / Procedures / Treatments   Labs (all labs ordered are listed, but only abnormal results are displayed) Labs Reviewed  COMPREHENSIVE METABOLIC PANEL - Abnormal; Notable for the following components:      Result Value   CO2 21 (*)    Glucose, Bld 111 (*)    All other components within normal limits  CBC - Abnormal; Notable for the following components:   WBC 12.6 (*)    All other components within normal limits  LIPASE, BLOOD  URINALYSIS, ROUTINE W REFLEX MICROSCOPIC  RAPID URINE DRUG SCREEN, HOSP PERFORMED  I-STAT BETA HCG BLOOD, ED ([facility], [facility], [facility] ONLY)    EKG EKG Interpretation  Date/Time:  Tuesday August 03 2021 09:29:02 EST Ventricular Rate:  79 PR Interval:  143 QRS Duration: 103 QT Interval:  372 QTC Calculation: 427 R Axis:   80 Text Interpretation: Sinus rhythm Confirmed by Kristine RoyalMessick, Nallely Yost [phone]) on 08/03/2021 9:48:30 AM  Radiology No results found.  Procedures Procedures    Medications Ordered in ED Medications  sodium chloride (PF) 0.9 % injection (has no administration in time range)  promethazine (PHENERGAN) 6.25 mg in sodium chloride 0.9 % 50 mL IVPB (0 mg Intravenous Stopped 08/03/21 1048)  HYDROmorphone (DILAUDID) injection 0.5 mg (0.5 mg Intravenous Given 08/03/21 1009)  LORazepam (ATIVAN) injection 0.5 mg (0.5 mg Intravenous Given 08/03/21 1010)    ED Course/ Medical Decision Making/ A&P                           Medical Decision Making Amount and/or Complexity of Data Reviewed Labs: ordered. Radiology: ordered.  Risk Prescription drug management.    Medical Screen Complete  This patient presented to the ED with complaint of  abdominal pain.  This complaint involves an extensive number of treatment options. The initial differential diagnosis includes, but is not limited to, intra-abdominal pathology, intra-abdominal infection, metabolic abnormality, renal failure,etc  This presentation is: Acute, Chronic, Self-Limited, Previously Undiagnosed, Uncertain Prognosis, Complicated, Systemic Symptoms, and Threat to Life/Bodily Function  Patient with long history of frequent presentations for abdominal discomfort presents with complaint of abdominal pain today.  Patient appears to be in some discomfort upon initial exam.  Work-up obtained is without evidence of significant acute pathology  Patient does feel significantly improved after administration of medications here in the ED to  help her symptoms.  She now desires DC home.  She does understand need for close follow-up with PCP also with GI.  Importance of close follow-up is repeatedly stressed.  Strict return precautions given and understood.   Co morbidities that complicated the patient's evaluation  Chronic abdominal pain   Additional history obtained:  External records from outside sources obtained and reviewed including prior ED visits and prior Inpatient records.    Lab Tests:  I ordered and personally interpreted labs.  The pertinent results include:  CBC, CMP, UA   Imaging Studies ordered:  I ordered imaging studies including CT abdomen pelvis I independently visualized and interpreted obtained imaging which showed no acute pathology I agree with the radiologist interpretation.   Cardiac Monitoring:  The patient was maintained on a cardiac monitor.  I personally viewed and interpreted the cardiac monitor which showed an underlying rhythm of: Sinus rhythm   Medicines ordered:  I ordered medication including Phenergan, Ativan, Dilaudid, and droperidol for nominal pain, nausea, vomiting Reevaluation of the patient after these medicines  showed that the patient: resolved    Problem List / ED Course:  Abdominal pain, nausea, vomiting   Reevaluation:  After the interventions noted above, I reevaluated the patient and found that they have: resolved   Dispostion:  After consideration of the diagnostic results and the patients response to treatment, I feel that the patent would benefit from close outpatient follow-up.          Final Clinical Impression(s) / ED Diagnoses Final diagnoses:  Generalized abdominal pain    Rx / DC Orders ED Discharge Orders     None         Wynetta Fines, MD 08/03/21 1344

## 2021-08-03 NOTE — ED Triage Notes (Signed)
Patient c/o mid abdominal pain that radiates into the left lower back, N/V/D x 2 days.

## 2021-08-03 NOTE — ED Notes (Signed)
Pt laying on stomach unwilling to lay on her back for me to get EKG will try again.

## 2021-08-03 NOTE — Discharge Instructions (Addendum)
Return for any problem.  ?

## 2021-08-06 ENCOUNTER — Other Ambulatory Visit: Payer: Self-pay

## 2021-08-06 ENCOUNTER — Emergency Department (HOSPITAL_COMMUNITY)
Admission: EM | Admit: 2021-08-06 | Discharge: 2021-08-06 | Disposition: A | Discharge status: Home / Self Care | Payer: BC Managed Care – PPO | Attending: Emergency Medicine | Admitting: Emergency Medicine

## 2021-08-06 ENCOUNTER — Emergency Department (HOSPITAL_COMMUNITY): Payer: BC Managed Care – PPO

## 2021-08-06 ENCOUNTER — Encounter (HOSPITAL_COMMUNITY): Payer: Self-pay

## 2021-08-06 DIAGNOSIS — R109 Unspecified abdominal pain: Secondary | ICD-10-CM | POA: Diagnosis present

## 2021-08-06 DIAGNOSIS — N3001 Acute cystitis with hematuria: Secondary | ICD-10-CM | POA: Insufficient documentation

## 2021-08-06 DIAGNOSIS — Z79899 Other long term (current) drug therapy: Secondary | ICD-10-CM | POA: Insufficient documentation

## 2021-08-06 LAB — CBC
HCT: 39.8 % (ref 36.0–46.0)
Hemoglobin: 13.8 g/dL (ref 12.0–15.0)
MCH: 31.8 pg (ref 26.0–34.0)
MCHC: 34.7 g/dL (ref 30.0–36.0)
MCV: 91.7 fL (ref 80.0–100.0)
Platelets: 268 10*3/uL (ref 150–400)
RBC: 4.34 MIL/uL (ref 3.87–5.11)
RDW: 11.9 % (ref 11.5–15.5)
WBC: 11.4 10*3/uL — ABNORMAL HIGH (ref 4.0–10.5)
nRBC: 0 % (ref 0.0–0.2)

## 2021-08-06 LAB — COMPREHENSIVE METABOLIC PANEL
ALT: 17 U/L (ref 0–44)
AST: 19 U/L (ref 15–41)
Albumin: 4.1 g/dL (ref 3.5–5.0)
Alkaline Phosphatase: 42 U/L (ref 38–126)
Anion gap: 9 (ref 5–15)
BUN: 16 mg/dL (ref 6–20)
CO2: 25 mmol/L (ref 22–32)
Calcium: 9.1 mg/dL (ref 8.9–10.3)
Chloride: 104 mmol/L (ref 98–111)
Creatinine, Ser: 0.86 mg/dL (ref 0.44–1.00)
GFR, Estimated: >60 mL/min (ref >60)
Glucose, Bld: 103 mg/dL — ABNORMAL HIGH (ref 70–99)
Potassium: 3.1 mmol/L — ABNORMAL LOW (ref 3.5–5.1)
Sodium: 138 mmol/L (ref 135–145)
Total Bilirubin: 0.5 mg/dL (ref 0.3–1.2)
Total Protein: 8.1 g/dL (ref 6.5–8.1)

## 2021-08-06 LAB — URINALYSIS, ROUTINE W REFLEX MICROSCOPIC
Bilirubin Urine: NEGATIVE
Glucose, UA: NEGATIVE mg/dL
Ketones, ur: 20 mg/dL — AB
Nitrite: NEGATIVE
Protein, ur: 100 mg/dL — AB
RBC / HPF: >50 RBC/hpf — ABNORMAL HIGH (ref 0–5)
Specific Gravity, Urine: 1.032 — ABNORMAL HIGH (ref 1.005–1.030)
pH: 5 (ref 5.0–8.0)

## 2021-08-06 LAB — I-STAT BETA HCG BLOOD, ED (MC, WL, AP ONLY): I-stat hCG, quantitative: <5 m[IU]/mL (ref <5)

## 2021-08-06 MED ORDER — MORPHINE SULFATE (PF) 4 MG/ML IV SOLN
4.0000 mg | Freq: Once | INTRAVENOUS | Status: AC
  Administered 2021-08-06: 4 mg via INTRAVENOUS
  Filled 2021-08-06: qty 1

## 2021-08-06 MED ORDER — SODIUM CHLORIDE 0.9 % IV BOLUS
1000.0000 mL | Freq: Once | INTRAVENOUS | Status: AC
  Administered 2021-08-06: 1000 mL via INTRAVENOUS

## 2021-08-06 MED ORDER — POTASSIUM CHLORIDE CRYS ER 20 MEQ PO TBCR
40.0000 meq | EXTENDED_RELEASE_TABLET | Freq: Once | ORAL | Status: AC
  Administered 2021-08-06: 40 meq via ORAL
  Filled 2021-08-06: qty 2

## 2021-08-06 MED ORDER — CIPROFLOXACIN HCL 500 MG PO TABS: 500.0000 mg | ORAL_TABLET | Freq: Two times a day (BID) | ORAL | 0 refills | Status: AC

## 2021-08-06 MED ORDER — ONDANSETRON HCL 4 MG PO TABS: 4.0000 mg | ORAL_TABLET | Freq: Four times a day (QID) | ORAL | 0 refills | Status: DC

## 2021-08-06 MED ORDER — SODIUM CHLORIDE (PF) 0.9 % IJ SOLN
INTRAMUSCULAR | Status: AC
  Filled 2021-08-06: qty 50

## 2021-08-06 MED ORDER — KETOROLAC TROMETHAMINE 15 MG/ML IJ SOLN
15.0000 mg | Freq: Once | INTRAMUSCULAR | Status: AC
  Administered 2021-08-06: 15 mg via INTRAVENOUS
  Filled 2021-08-06: qty 1

## 2021-08-06 MED ORDER — HYDROCODONE-ACETAMINOPHEN 5-325 MG PO TABS: 1.0000 | ORAL_TABLET | Freq: Four times a day (QID) | ORAL | 0 refills | Status: DC | PRN

## 2021-08-06 MED ORDER — ONDANSETRON HCL 4 MG/2ML IJ SOLN
4.0000 mg | Freq: Once | INTRAMUSCULAR | Status: AC
Start: 2021-08-06 — End: 2021-08-06
  Administered 2021-08-06: 4 mg via INTRAVENOUS
  Filled 2021-08-06: qty 2

## 2021-08-06 MED ORDER — SODIUM CHLORIDE 0.9 % IV SOLN
1.0000 g | Freq: Once | INTRAVENOUS | Status: AC
  Administered 2021-08-06: 1 g via INTRAVENOUS
  Filled 2021-08-06: qty 10

## 2021-08-06 MED ORDER — IOHEXOL 300 MG/ML  SOLN
100.0000 mL | Freq: Once | INTRAMUSCULAR | Status: AC | PRN
  Administered 2021-08-06: 100 mL via INTRAVENOUS

## 2021-08-06 NOTE — Discharge Instructions (Addendum)
Please use Tylenol or ibuprofen for pain.  You may use 600 mg ibuprofen every 6 hours or 1000 mg of Tylenol every 6 hours.  You may choose to alternate between the 2.  This would be most effective.  Not to exceed 4 g of Tylenol within 24 hours.  Not to exceed 3200 mg ibuprofen 24 hours.  I am prescribing you 3 medications.  First is a pain medication called Norco which she can take for breakthrough pain in place of Tylenol. Prescribe you some nausea medication that you can take to help with your appetite.  Finally I am prescribing an antibiotic, please take these antibiotics twice daily, please complete the entire course.  I recommend that you take this on a full stomach, take it with a probiotic to decrease risk of diarrhea or GI upset.  Have attached the contact information for urology group in the area, if you have ongoing symptoms, pain with urination despite treatment please follow-up with urology, if your pain significantly worsens, or you become unable to urinate despite feeling like you need to please return to the emergency department.

## 2021-08-06 NOTE — ED Provider Notes (Signed)
[facility]-EMERGENCY DEPT Provider Note   CSN: [id number] Arrival date & time: 08/06/21  0406     History  Chief Complaint  Patient presents with   Abdominal Pain    Tammy BareChynna Membreno is a 31 y.o. female with past medical history significant for gastric sleeve surgery who presents with abdominal pain, nausea, vomiting for one week.  Patient does endorse some chills without objective fever.. Some pressure with urination, denies dysuria, hematuria. Denies vaginal discharge / itching / dyspareunia.  Nothing taken for pain prior to arrival.  Denies chest pain, shortness of breath, history of kidney stones, constipation, diarrhea.  Pain 9/10 at arrival.  Abdominal Pain Associated symptoms: dysuria       Home Medications Prior to Admission medications   Medication Sig Start Date End Date Taking? Authorizing Provider  ciprofloxacin (CIPRO) 500 MG tablet Take 1 tablet (500 mg total) by mouth every 12 (twelve) hours. 08/06/21  Yes Brexlee Heberlein H, PA-C  HYDROcodone-acetaminophen (NORCO/VICODIN) 5-325 MG tablet Take 1-2 tablets by mouth every 6 (six) hours as needed. 08/06/21  Yes Tinie Mcgloin H, PA-C  ondansetron (ZOFRAN) 4 MG tablet Take 1 tablet (4 mg total) by mouth every 6 (six) hours. 08/06/21  Yes Larisa Lanius H, PA-C  acetaminophen (TYLENOL) 500 MG tablet Take 1,000 mg by mouth every 6 (six) hours as needed for moderate pain.    [provider]  cephALEXin (KEFLEX) 500 MG capsule Take 1 capsule (500 mg total) by mouth 3 (three) times daily. 02/15/21   Jacalyn LefevreHaviland, Julie, MD  ibuprofen (ADVIL) 600 MG tablet Take 1 tablet (600 mg total) by mouth every 6 (six) hours as needed. 02/15/21   Jacalyn LefevreHaviland, Julie, MD  metoCLOPramide (REGLAN) 10 MG tablet Take 1 tablet (10 mg total) by mouth every 6 (six) hours. 02/17/21   Henderly, Britni A, PA-C  ondansetron (ZOFRAN ODT) 4 MG disintegrating tablet Take 1 tablet (4 mg total) by mouth every 8 (eight) hours as  needed for nausea or vomiting. 02/17/21   Henderly, Britni A, PA-C  oxyCODONE-acetaminophen (PERCOCET/ROXICET) 5-325 MG tablet Take 1 tablet by mouth every 6 (six) hours as needed for severe pain. 02/17/21   Henderly, Britni A, PA-C  promethazine (PHENERGAN) 25 MG tablet Take 1 tablet (25 mg total) by mouth every 8 (eight) hours as needed for nausea or vomiting. 02/15/21   Jacalyn LefevreHaviland, Julie, MD  promethazine (PHENERGAN) 25 MG tablet Take 1 tablet (25 mg total) by mouth every 6 (six) hours as needed for nausea or vomiting. 08/02/21   Gailen ShelterFondaw, Wylder S, PA  haloperidol (HALDOL) 5 MG tablet Take 1 tablet (5 mg total) by mouth every 6 (six) hours as needed (nausea or vomiting). 06/17/19 06/18/19  Dione BoozeGlick, David, MD      Allergies    Haldol [haloperidol lactate] and Pork-derived products    Review of Systems   Review of Systems  Gastrointestinal:  Positive for abdominal pain.  Genitourinary:  Positive for dysuria and urgency.  All other systems reviewed and are negative.  Physical Exam Updated Vital Signs BP 139/89    Pulse 78    Temp 98.4 F (36.9 C) (Oral)    Resp (!) 22    Ht 5\' 7"  (1.702 m)    Wt 108.9 kg    LMP 08/01/2021 (Exact Date) Comment: negative beta HCG 08/06/21   SpO2 100%    BMI 37.59 kg/m  Physical Exam Vitals and nursing note reviewed.  Constitutional:      General: She is in acute  distress.     Appearance: Normal appearance.     Comments: Overall well-appearing patient who is in some distress, pain, was holding self in fetal position at time of initial assessment.  HENT:     Head: Normocephalic and atraumatic.  Eyes:     General:        Right eye: No discharge.        Left eye: No discharge.  Cardiovascular:     Rate and Rhythm: Normal rate and regular rhythm.     Heart sounds: No murmur heard.   No friction rub. No gallop.  Pulmonary:     Effort: Pulmonary effort is normal.     Breath sounds: Normal breath sounds.  Abdominal:     General: Bowel sounds are normal.      Palpations: Abdomen is soft.     Comments: Significant tenderness to palpation of the suprapubic region, extending upward to right flank.  No tenderness on left flank.  Negative CVA tenderness bilaterally.  No rebound, rigidity, guarding.  Normal bowel sounds throughout.  Skin:    General: Skin is warm and dry.     Capillary Refill: Capillary refill takes less than 2 seconds.  Neurological:     Mental Status: She is alert and oriented to person, place, and time.  Psychiatric:        Mood and Affect: Mood normal.        Behavior: Behavior normal.    ED Results / Procedures / Treatments   Labs (all labs ordered are listed, but only abnormal results are displayed) Labs Reviewed  COMPREHENSIVE METABOLIC PANEL - Abnormal; Notable for the following components:      Result Value   Potassium 3.1 (*)    Glucose, Bld 103 (*)    All other components within normal limits  CBC - Abnormal; Notable for the following components:   WBC 11.4 (*)    All other components within normal limits  URINALYSIS, ROUTINE W REFLEX MICROSCOPIC - Abnormal; Notable for the following components:   Color, Urine AMBER (*)    APPearance CLOUDY (*)    Specific Gravity, Urine 1.032 (*)    Hgb urine dipstick LARGE (*)    Ketones, ur 20 (*)    Protein, ur 100 (*)    Leukocytes,Ua LARGE (*)    RBC / HPF >50 (*)    Bacteria, UA RARE (*)    All other components within normal limits  I-STAT BETA HCG BLOOD, ED ([facility], [facility], [facility] ONLY)    EKG None  Radiology CT ABDOMEN PELVIS W CONTRAST  Result Date: 08/06/2021 CLINICAL DATA:  Abdominal pain since 08/01/2021 EXAM: CT ABDOMEN AND PELVIS WITH CONTRAST TECHNIQUE: Multidetector CT imaging of the abdomen and pelvis was performed using the standard protocol following bolus administration of intravenous contrast. RADIATION DOSE REDUCTION: This exam was performed according to the departmental dose-optimization program which includes automated exposure control, adjustment of the mA  and/or kV according to patient size and/or use of iterative reconstruction technique. CONTRAST:  156mL OMNIPAQUE IOHEXOL 300 MG/ML  SOLN COMPARISON:  CT abdomen/pelvis 08/03/2021 FINDINGS: Lower chest: The lung bases are clear. The imaged heart is unremarkable. Hepatobiliary: The liver and gallbladder are unremarkable. There is no biliary ductal dilatation. Pancreas: Unremarkable. Spleen: Unremarkable. Adrenals/Urinary Tract: The adrenals are unremarkable. There is a punctate nonobstructing right upper pole renal stone (5-78). There are no other stones. There are no focal lesions. There is no hydronephrosis or hydroureter. The bladder is unremarkable. 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 There is no evidence of bowel obstruction. There is no abnormal bowel wall thickening or inflammatory change. The appendix is normal. Vascular/Lymphatic: Abdominal aorta is normal in course and caliber. The major branch vessels are patent. The main portal and splenic veins are patent. There is no abdominal or pelvic lymphadenopathy. Reproductive: A simple appearing right ovarian cyst is again seen, for which no follow-up imaging is required. The uterus and adnexa are otherwise unremarkable. Other: There is no ascites or free air. Musculoskeletal: There is no acute osseous abnormality or aggressive osseous lesion. IMPRESSION: 1. No acute findings in the abdomen or pelvis. No significant interval change since 08/03/2021 2. Punctate nonobstructing right upper pole renal stone. Electronically Signed   By: Valetta Mole M.D.   On: 08/06/2021 08:48    Procedures Procedures    Medications Ordered in ED Medications  sodium chloride (PF) 0.9 % injection (has no administration in time range)  morphine 4 MG/ML injection 4 mg (4 mg Intravenous Given 08/06/21 0757)  ondansetron (ZOFRAN) injection 4 mg (4 mg Intravenous Given 08/06/21 0757)  cefTRIAXone (ROCEPHIN) 1 g in sodium chloride 0.9 %  100 mL IVPB (0 g Intravenous Stopped 08/06/21 0838)  sodium chloride 0.9 % bolus 1,000 mL (0 mLs Intravenous Stopped 08/06/21 0945)  iohexol (OMNIPAQUE) 300 MG/ML solution 100 mL (100 mLs Intravenous Contrast Given 08/06/21 0827)  morphine 4 MG/ML injection 4 mg (4 mg Intravenous Given 08/06/21 0851)  ketorolac (TORADOL) 15 MG/ML injection 15 mg (15 mg Intravenous Given 08/06/21 0851)  potassium chloride SA (KLOR-CON M) CR tablet 40 mEq (40 mEq Oral Given 08/06/21 0944)    ED Course/ Medical Decision Making/ A&P Clinical Course as of 08/06/21 1012  Fri Aug 06, 2021  0726 Abdominal pain, nausea, vomiting for one week -- hx of gastric sleeve. Fever / chills. Some pressure with urination. Denies vaginal discharge / itching / dyspareunia. UA significant for UTI -- significant pain on abdominal exam, will obtain CT abd / pelvis, control pain, give dose of ABX and reassess [CP]    Clinical Course User Index [CP] Anselmo Pickler, PA-C                           Medical Decision Making Amount and/or Complexity of Data Reviewed Labs: ordered. Radiology: ordered.  Risk Prescription drug management.   I discussed this case with my attending physician who cosigned this note including patient's presenting symptoms, physical exam, and planned diagnostics and interventions. Attending physician stated agreement with plan or made changes to plan which were implemented.   Attending physician assessed patient at bedside.  This is an overall well-appearing patient who presents with generalized abdominal pain, pressure with urination for the last week.  Has had nausea, vomiting as well. Differential diagnosis includes ovarian torsion, ectopic pregnancy, urinary tract infection, pyelonephritis, nephrolithiasis, diverticulitis, appendicitis, versus other acute or surgical intra-abdominal issues.  Less likely to be MSK, muscle wall pain based on her presentation.  Minimal clinical concern for bowel purge, acute  mesenteric ischemia at this time.  Patient with comorbidity of previous gastric sleeve surgery.  I personally ordered and reviewed lab work including beta-hCG which is significant for no pregnancy.  CMP shows significant for mild hypokalemia 3.1, oral potassium administered.  Mild hyperglycemia of 103.  Urinalysis with large hemoglobin, protein, leukocytes, and bacteria.  This appears consistent with urinary tract infection.  Her CBC shows a mild leukocytosis of 11.4.  Given  her significant pain obtain a CT abdomen and pelvis to assess for other acute intra-abdominal pathology, kidney stone, versus other.  CT shows no evidence of intra-abdominal abnormality to explain patient's pain.  Agree with radiologist interpretation.  With urinalysis that is consistent with UTI, significant pain extending up to the flank, as well as subjective report of chills we will treat this as severe UTI versus early Pyelo.  Administered Rocephin, and fluid bolus.  Will discharge with Cipro, nausea medication, short course of pain medication.  Encourage follow-up with urology.  Discussed extensive return precautions.  Patient discharged in stable condition at this time. Final Clinical Impression(s) / ED Diagnoses Final diagnoses:  Acute cystitis with hematuria    Rx / DC Orders ED Discharge Orders          Ordered    ciprofloxacin (CIPRO) 500 MG tablet  Every 12 hours        08/06/21 0915    ondansetron (ZOFRAN) 4 MG tablet  Every 6 hours        08/06/21 0915    HYDROcodone-acetaminophen (NORCO/VICODIN) 5-325 MG tablet  Every 6 hours PRN        08/06/21 0915              Mildred Bollard, Joesph Fillers, PA-C 08/06/21 1017    Lajean Saver, MD 08/06/21 1521

## 2021-08-06 NOTE — ED Notes (Signed)
Unable to tolerate IV attempt.

## 2021-08-06 NOTE — ED Triage Notes (Signed)
Pt arrives EMS from home with c/o generalized abdominal pain, n/v since 08/01/21. Taking Zofran ODT pta with no improvement.

## 2021-08-19 ENCOUNTER — Emergency Department (HOSPITAL_BASED_OUTPATIENT_CLINIC_OR_DEPARTMENT_OTHER)
Admission: EM | Admit: 2021-08-19 | Discharge: 2021-08-20 | Disposition: A | Discharge status: Home / Self Care | Payer: BC Managed Care – PPO | Attending: Emergency Medicine | Admitting: Emergency Medicine

## 2021-08-19 ENCOUNTER — Other Ambulatory Visit: Payer: Self-pay

## 2021-08-19 ENCOUNTER — Encounter (HOSPITAL_BASED_OUTPATIENT_CLINIC_OR_DEPARTMENT_OTHER): Payer: Self-pay | Admitting: *Deleted

## 2021-08-19 DIAGNOSIS — M545 Low back pain, unspecified: Secondary | ICD-10-CM | POA: Insufficient documentation

## 2021-08-19 DIAGNOSIS — R1084 Generalized abdominal pain: Secondary | ICD-10-CM | POA: Insufficient documentation

## 2021-08-19 DIAGNOSIS — D72829 Elevated white blood cell count, unspecified: Secondary | ICD-10-CM | POA: Diagnosis not present

## 2021-08-19 DIAGNOSIS — R112 Nausea with vomiting, unspecified: Secondary | ICD-10-CM

## 2021-08-19 LAB — URINALYSIS, MICROSCOPIC (REFLEX)

## 2021-08-19 LAB — URINALYSIS, ROUTINE W REFLEX MICROSCOPIC
Glucose, UA: NEGATIVE mg/dL
Hgb urine dipstick: NEGATIVE
Ketones, ur: 40 mg/dL — AB
Nitrite: NEGATIVE
Protein, ur: 100 mg/dL — AB
Specific Gravity, Urine: >=1.03 (ref 1.005–1.030)
pH: 5.5 (ref 5.0–8.0)

## 2021-08-19 LAB — CBC WITH DIFFERENTIAL/PLATELET
Abs Immature Granulocytes: 0.03 10*3/uL (ref 0.00–0.07)
Basophils Absolute: 0 10*3/uL (ref 0.0–0.1)
Basophils Relative: 0 %
Eosinophils Absolute: 0 10*3/uL (ref 0.0–0.5)
Eosinophils Relative: 0 %
HCT: 41 % (ref 36.0–46.0)
Hemoglobin: 14.2 g/dL (ref 12.0–15.0)
Immature Granulocytes: 0 %
Lymphocytes Relative: 16 %
Lymphs Abs: 1.9 10*3/uL (ref 0.7–4.0)
MCH: 31.8 pg (ref 26.0–34.0)
MCHC: 34.6 g/dL (ref 30.0–36.0)
MCV: 91.7 fL (ref 80.0–100.0)
Monocytes Absolute: 1.1 10*3/uL — ABNORMAL HIGH (ref 0.1–1.0)
Monocytes Relative: 9 %
Neutro Abs: 8.7 10*3/uL — ABNORMAL HIGH (ref 1.7–7.7)
Neutrophils Relative %: 75 %
Platelets: 316 10*3/uL (ref 150–400)
RBC: 4.47 MIL/uL (ref 3.87–5.11)
RDW: 12.1 % (ref 11.5–15.5)
WBC: 11.7 10*3/uL — ABNORMAL HIGH (ref 4.0–10.5)
nRBC: 0 % (ref 0.0–0.2)

## 2021-08-19 LAB — COMPREHENSIVE METABOLIC PANEL
ALT: 20 U/L (ref 0–44)
AST: 23 U/L (ref 15–41)
Albumin: 4.6 g/dL (ref 3.5–5.0)
Alkaline Phosphatase: 47 U/L (ref 38–126)
Anion gap: 11 (ref 5–15)
BUN: 11 mg/dL (ref 6–20)
CO2: 25 mmol/L (ref 22–32)
Calcium: 9.7 mg/dL (ref 8.9–10.3)
Chloride: 104 mmol/L (ref 98–111)
Creatinine, Ser: 0.95 mg/dL (ref 0.44–1.00)
GFR, Estimated: >60 mL/min (ref >60)
Glucose, Bld: 108 mg/dL — ABNORMAL HIGH (ref 70–99)
Potassium: 3.8 mmol/L (ref 3.5–5.1)
Sodium: 140 mmol/L (ref 135–145)
Total Bilirubin: 1 mg/dL (ref 0.3–1.2)
Total Protein: 8.6 g/dL — ABNORMAL HIGH (ref 6.5–8.1)

## 2021-08-19 LAB — PREGNANCY, URINE: Preg Test, Ur: NEGATIVE

## 2021-08-19 LAB — LIPASE, BLOOD: Lipase: 29 U/L (ref 11–51)

## 2021-08-19 MED ORDER — ONDANSETRON HCL 4 MG/2ML IJ SOLN
4.0000 mg | Freq: Once | INTRAMUSCULAR | Status: AC
Start: 2021-08-19 — End: 2021-08-19
  Administered 2021-08-19: 4 mg via INTRAVENOUS
  Filled 2021-08-19: qty 2

## 2021-08-19 MED ORDER — DROPERIDOL 2.5 MG/ML IJ SOLN
1.2500 mg | Freq: Once | INTRAMUSCULAR | Status: AC
Start: 2021-08-19 — End: 2021-08-19
  Administered 2021-08-19: 1.25 mg via INTRAVENOUS
  Filled 2021-08-19: qty 2

## 2021-08-19 MED ORDER — METRONIDAZOLE 500 MG PO TABS
2000.0000 mg | ORAL_TABLET | Freq: Once | ORAL | Status: AC
  Administered 2021-08-19: 2000 mg via ORAL
  Filled 2021-08-19: qty 4

## 2021-08-19 MED ORDER — FAMOTIDINE IN NACL 20-0.9 MG/50ML-% IV SOLN
20.0000 mg | Freq: Once | INTRAVENOUS | Status: AC
  Administered 2021-08-19: 20 mg via INTRAVENOUS
  Filled 2021-08-19: qty 50

## 2021-08-19 MED ORDER — METOCLOPRAMIDE HCL 5 MG/ML IJ SOLN
10.0000 mg | Freq: Once | INTRAMUSCULAR | Status: AC
  Administered 2021-08-19: 10 mg via INTRAVENOUS
  Filled 2021-08-19: qty 2

## 2021-08-19 MED ORDER — CEFTRIAXONE SODIUM 2 G IJ SOLR
2.0000 g | Freq: Once | INTRAMUSCULAR | Status: AC
  Administered 2021-08-19: 2 g via INTRAVENOUS
  Filled 2021-08-19: qty 20

## 2021-08-19 MED ORDER — SODIUM CHLORIDE 0.9 % IV BOLUS
1000.0000 mL | Freq: Once | INTRAVENOUS | Status: AC
  Administered 2021-08-19: 1000 mL via INTRAVENOUS

## 2021-08-19 NOTE — ED Provider Notes (Signed)
[facility] EMERGENCY DEPARTMENT Provider Note   CSN: [id number] Arrival date & time: 08/19/21  2009     History  Chief Complaint  Patient presents with   Abdominal Pain   Emesis    Tammy Andrews is a 31 y.o. female who presents to the ED today with ongoing complaint of diffuse abdominal pain, back pain, nausea, and NBNB emesis for the past 2 weeks. Per chart review pt with multiple ED visits this past month for same with hx of marijuana abuse. On 1/20 she was diagnosed with UTI and discharged home with cipro. She reports no improvement in symptoms prompting return ED visit. She continues to smoke marijuana. She denies any specific urinary complaints at this time including dysuria, urinary frequency, hematuria, urgency. Denies fevers or chills.   The history is provided by the patient and medical records.      Home Medications Prior to Admission medications   Medication Sig Start Date End Date Taking? Authorizing Provider  metoCLOPramide (REGLAN) 10 MG tablet Take 1 tablet (10 mg total) by mouth every 6 (six) hours. 08/20/21  Yes Jia Mohamed, PA-C  promethazine (PHENERGAN) 25 MG suppository Place 1 suppository (25 mg total) rectally every 6 (six) hours as needed for nausea or vomiting. 08/20/21  Yes Kyonna Frier, PA-C  acetaminophen (TYLENOL) 500 MG tablet Take 1,000 mg by mouth every 6 (six) hours as needed for moderate pain.    [provider]  cephALEXin (KEFLEX) 500 MG capsule Take 1 capsule (500 mg total) by mouth 3 (three) times daily. 02/15/21   Isla Pence, MD  ciprofloxacin (CIPRO) 500 MG tablet Take 1 tablet (500 mg total) by mouth every 12 (twelve) hours. 08/06/21   Prosperi, Christian H, PA-C  HYDROcodone-acetaminophen (NORCO/VICODIN) 5-325 MG tablet Take 1-2 tablets by mouth every 6 (six) hours as needed. 08/06/21   Prosperi, Christian H, PA-C  ibuprofen (ADVIL) 600 MG tablet Take 1 tablet (600 mg total) by mouth every 6 (six) hours as needed. 02/15/21    Isla Pence, MD  metoCLOPramide (REGLAN) 10 MG tablet Take 1 tablet (10 mg total) by mouth every 6 (six) hours. 02/17/21   Henderly, Britni A, PA-C  ondansetron (ZOFRAN ODT) 4 MG disintegrating tablet Take 1 tablet (4 mg total) by mouth every 8 (eight) hours as needed for nausea or vomiting. 02/17/21   Henderly, Britni A, PA-C  ondansetron (ZOFRAN) 4 MG tablet Take 1 tablet (4 mg total) by mouth every 6 (six) hours. 08/06/21   Prosperi, Christian H, PA-C  oxyCODONE-acetaminophen (PERCOCET/ROXICET) 5-325 MG tablet Take 1 tablet by mouth every 6 (six) hours as needed for severe pain. 02/17/21   Henderly, Britni A, PA-C  promethazine (PHENERGAN) 25 MG tablet Take 1 tablet (25 mg total) by mouth every 8 (eight) hours as needed for nausea or vomiting. 02/15/21   Isla Pence, MD  promethazine (PHENERGAN) 25 MG tablet Take 1 tablet (25 mg total) by mouth every 6 (six) hours as needed for nausea or vomiting. 08/02/21   Tedd Sias, PA  haloperidol (HALDOL) 5 MG tablet Take 1 tablet (5 mg total) by mouth every 6 (six) hours as needed (nausea or vomiting). [id number]  Delora Fuel, MD      Allergies    Haldol [haloperidol lactate] and Pork-derived products    Review of Systems   Review of Systems  Constitutional:  Negative for chills and fever.  Gastrointestinal:  Positive for abdominal pain, nausea and vomiting. Negative for constipation and diarrhea.  Genitourinary:  Negative for dysuria, frequency and hematuria.  Musculoskeletal:  Positive for back pain.  All other systems reviewed and are negative.  Physical Exam Updated Vital Signs BP (!) 154/97    Pulse 89    Temp 99.5 F (37.5 C) (Oral)    Resp (!) 24    Ht 5\' 7"  (1.702 m)    Wt 108.9 kg    LMP 08/01/2021 (Exact Date) Comment: negative beta HCG 08/06/21   SpO2 100%    BMI 37.60 kg/m  Physical Exam Vitals and nursing note reviewed.  Constitutional:      Appearance: She is not ill-appearing or diaphoretic.  HENT:     Head:  Normocephalic and atraumatic.     Mouth/Throat:     Mouth: Mucous membranes are dry.  Eyes:     Conjunctiva/sclera: Conjunctivae normal.  Cardiovascular:     Rate and Rhythm: Normal rate and regular rhythm.     Heart sounds: Normal heart sounds.  Pulmonary:     Effort: Pulmonary effort is normal.     Breath sounds: Normal breath sounds. No wheezing, rhonchi or rales.  Abdominal:     General: Abdomen is flat.     Palpations: Abdomen is soft.     Tenderness: There is generalized abdominal tenderness. There is no right CVA tenderness, left CVA tenderness, guarding or rebound.     Comments: No obvious CVA TTP  Musculoskeletal:     Cervical back: Neck supple.     Comments: Diffuse bilateral paralumbar musculature TTP  Skin:    General: Skin is warm and dry.  Neurological:     Mental Status: She is alert.    ED Results / Procedures / Treatments   Labs (all labs ordered are listed, but only abnormal results are displayed) Labs Reviewed  URINALYSIS, ROUTINE W REFLEX MICROSCOPIC - Abnormal; Notable for the following components:      Result Value   Color, Urine AMBER (*)    APPearance HAZY (*)    Bilirubin Urine SMALL (*)    Ketones, ur 40 (*)    Protein, ur 100 (*)    Leukocytes,Ua MODERATE (*)    All other components within normal limits  CBC WITH DIFFERENTIAL/PLATELET - Abnormal; Notable for the following components:   WBC 11.7 (*)    Neutro Abs 8.7 (*)    Monocytes Absolute 1.1 (*)    All other components within normal limits  COMPREHENSIVE METABOLIC PANEL - Abnormal; Notable for the following components:   Glucose, Bld 108 (*)    Total Protein 8.6 (*)    All other components within normal limits  URINALYSIS, MICROSCOPIC (REFLEX) - Abnormal; Notable for the following components:   Bacteria, UA RARE (*)    Trichomonas, UA PRESENT (*)    All other components within normal limits  URINE CULTURE  LIPASE, BLOOD  PREGNANCY, URINE    EKG None  Radiology No results  found.  Procedures Procedures    Medications Ordered in ED Medications  ondansetron (ZOFRAN) injection 4 mg (4 mg Intravenous Given 08/19/21 2108)  sodium chloride 0.9 % bolus 1,000 mL (0 mLs Intravenous Stopped 08/19/21 2333)  cefTRIAXone (ROCEPHIN) 2 g in sodium chloride 0.9 % 100 mL IVPB (0 g Intravenous Stopped 08/19/21 2333)  droperidol (INAPSINE) 2.5 MG/ML injection 1.25 mg (1.25 mg Intravenous Given 08/19/21 2222)  metoCLOPramide (REGLAN) injection 10 mg (10 mg Intravenous Given 08/19/21 2345)  famotidine (PEPCID) IVPB 20 mg premix (20 mg Intravenous New Bag/Given 08/19/21 2348)  metroNIDAZOLE (FLAGYL) tablet 2,000  mg (2,000 mg Oral Given 08/19/21 2353)    ED Course/ Medical Decision Making/ A&P                           Medical Decision Making 31 year old female presents to the ED today with ongoing complaints of nausea, vomiting, diffuse abdominal pain, back pain for about 2 weeks.  Multiple ER visits in January with most recent ER visit with diagnosis of urinary tract infection and discharged home with Cipro.  On arrival to the ED today patient is afebrile, nontachycardic and nontachypneic.  He is noted to have diffuse abdominal tenderness palpation with bilateral paralumbar musculature tenderness palpation without obvious CVA tenderness to palpation.  CT scan on 1/20 without acute findings.  Pertinent urinalysis at that time and did show large leuks, > 50 RBCs, 21-50 WCBS however 21-50 squamous epithelial cells. Pt did describe some pressure with urination at that time. No urine culture sent at that time. She does have hx of marijuana abuse and previously treated with droperidol for symptom management. She continues to smoke marijuana. Will plan for repeat labs and u/a at this time. On exam she has diffuse abd TTP without rebound or guarding. No obvious flank TTP.   Labwork without significant findings at this time. No electrolyte derangements today. Pt does continue to have moderate leuks and  bacteria on u/a however is currently positive for trichomonas. Per chart review her urine during last ED visit did not seem consistent with UTI - did appear contaminated. Unfortunately no culture sent at that time. Culture sent here today however suspect sx are likely related to cannabis hyperemesis syndrome vs UTI/pyelo. If culture positive recommend symptom check/treatment if sx continue. 1 dose rocephin provided in the ED however will await urine culture report prior to treating as I have lower suspicion for UTI at this time.   On reevaluation after droperidol, fluids, and additional reglan/pepcid pt able to tolerate fluids without difficulty. Will discharge home at this time. Pt instructed to follow up with PCP for further eval. Had lengthy discussion with her regarding marijuana use likely playing a role in her ED visits.   Problems Addressed: Nausea and vomiting, unspecified vomiting type: acute illness or injury  Amount and/or Complexity of Data Reviewed Labs: ordered.    Details: CBC with mile lekocytosis 11,700 without left shift. Hgb stable at 14.2.  CMP with glucose 108. No other electrolyte abnormalities including normal K at 3.8 and normal sodium 140.  Lipase WNL at 29 U/A with moderate leuks, rare bacteria, and 21-50 WBCs. Does appear improved from previous. Trich positive. No complaints of pelvic pain or vaginal discharge.  UPT negative  Risk Prescription drug management.          Final Clinical Impression(s) / ED Diagnoses Final diagnoses:  Nausea and vomiting, unspecified vomiting type    Rx / DC Orders ED Discharge Orders          Ordered    promethazine (PHENERGAN) 25 MG suppository  Every 6 hours PRN        08/20/21 0021    metoCLOPramide (REGLAN) 10 MG tablet  Every 6 hours        08/20/21 0021             Discharge Instructions      Please pick up medication and take as prescribed. If you continue to have vomiting despite oral medication switch  to the suppository.   Drink plenty of fluids  to stay hydrated and rest as much as possible.  Please refrain from marijuana use at this time.   We have sent your urine for culture and will call you in 2-3 days time if it grows anything concerning for infection  Return to the ED for any new/worsening symptoms       Eustaquio Maize, PA-C 08/20/21 Gae Dry, MD 08/21/21 1426

## 2021-08-19 NOTE — ED Notes (Signed)
Pt provided with saltine crackers and water for PO challenge, will continue to monitor.

## 2021-08-19 NOTE — ED Triage Notes (Signed)
She feels she has a kidney infection and dehydration. Vomiting and abdominal pain x 2 weeks.

## 2021-08-19 NOTE — ED Notes (Signed)
Lab notified to add-on urine culture to previously collected urine sample.  

## 2021-08-20 MED ORDER — METOCLOPRAMIDE HCL 10 MG PO TABS: 10.0000 mg | ORAL_TABLET | Freq: Four times a day (QID) | ORAL | 0 refills | Status: DC

## 2021-08-20 MED ORDER — PROMETHAZINE HCL 25 MG RE SUPP: 25.0000 mg | Freq: Four times a day (QID) | RECTAL | 0 refills | Status: DC | PRN

## 2021-08-20 NOTE — ED Notes (Signed)
Pt tolerating PO intake at this time. D/c paperwork reviewed with pt, including prescriptions. No questions or concerns at time of d/c. Pt wheeled to ED exit, per her request.

## 2021-08-20 NOTE — Discharge Instructions (Signed)
Please pick up medication and take as prescribed. If you continue to have vomiting despite oral medication switch to the suppository.   Drink plenty of fluids to stay hydrated and rest as much as possible.  Please refrain from marijuana use at this time.   We have sent your urine for culture and will call you in 2-3 days time if it grows anything concerning for infection  Return to the ED for any new/worsening symptoms

## 2021-08-21 LAB — URINE CULTURE

## 2021-08-25 ENCOUNTER — Emergency Department (HOSPITAL_COMMUNITY)
Admission: EM | Admit: 2021-08-25 | Discharge: 2021-08-25 | Disposition: A | Discharge status: Home / Self Care | Payer: BC Managed Care – PPO | Attending: Emergency Medicine | Admitting: Emergency Medicine

## 2021-08-25 ENCOUNTER — Encounter (HOSPITAL_COMMUNITY): Payer: Self-pay

## 2021-08-25 ENCOUNTER — Other Ambulatory Visit: Payer: Self-pay

## 2021-08-25 ENCOUNTER — Emergency Department (HOSPITAL_COMMUNITY): Payer: BC Managed Care – PPO

## 2021-08-25 DIAGNOSIS — F17200 Nicotine dependence, unspecified, uncomplicated: Secondary | ICD-10-CM | POA: Insufficient documentation

## 2021-08-25 DIAGNOSIS — F12988 Cannabis use, unspecified with other cannabis-induced disorder: Secondary | ICD-10-CM | POA: Diagnosis not present

## 2021-08-25 DIAGNOSIS — N9489 Other specified conditions associated with female genital organs and menstrual cycle: Secondary | ICD-10-CM | POA: Diagnosis not present

## 2021-08-25 DIAGNOSIS — N200 Calculus of kidney: Secondary | ICD-10-CM | POA: Insufficient documentation

## 2021-08-25 DIAGNOSIS — R112 Nausea with vomiting, unspecified: Secondary | ICD-10-CM | POA: Diagnosis not present

## 2021-08-25 DIAGNOSIS — M549 Dorsalgia, unspecified: Secondary | ICD-10-CM | POA: Insufficient documentation

## 2021-08-25 DIAGNOSIS — D72829 Elevated white blood cell count, unspecified: Secondary | ICD-10-CM | POA: Diagnosis not present

## 2021-08-25 LAB — URINALYSIS, ROUTINE W REFLEX MICROSCOPIC
Bacteria, UA: NONE SEEN
Glucose, UA: NEGATIVE mg/dL
Hgb urine dipstick: NEGATIVE
Ketones, ur: 5 mg/dL — AB
Nitrite: NEGATIVE
Protein, ur: 30 mg/dL — AB
Specific Gravity, Urine: 1.03 (ref 1.005–1.030)
pH: 5 (ref 5.0–8.0)

## 2021-08-25 LAB — CBC
HCT: 38 % (ref 36.0–46.0)
Hemoglobin: 13.2 g/dL (ref 12.0–15.0)
MCH: 32 pg (ref 26.0–34.0)
MCHC: 34.7 g/dL (ref 30.0–36.0)
MCV: 92 fL (ref 80.0–100.0)
Platelets: 241 10*3/uL (ref 150–400)
RBC: 4.13 MIL/uL (ref 3.87–5.11)
RDW: 11.9 % (ref 11.5–15.5)
WBC: 10.1 10*3/uL (ref 4.0–10.5)
nRBC: 0 % (ref 0.0–0.2)

## 2021-08-25 LAB — COMPREHENSIVE METABOLIC PANEL
ALT: 21 U/L (ref 0–44)
AST: 23 U/L (ref 15–41)
Albumin: 4.1 g/dL (ref 3.5–5.0)
Alkaline Phosphatase: 38 U/L (ref 38–126)
Anion gap: 9 (ref 5–15)
BUN: 14 mg/dL (ref 6–20)
CO2: 27 mmol/L (ref 22–32)
Calcium: 9.3 mg/dL (ref 8.9–10.3)
Chloride: 102 mmol/L (ref 98–111)
Creatinine, Ser: 0.89 mg/dL (ref 0.44–1.00)
GFR, Estimated: >60 mL/min (ref >60)
Glucose, Bld: 108 mg/dL — ABNORMAL HIGH (ref 70–99)
Potassium: 3.2 mmol/L — ABNORMAL LOW (ref 3.5–5.1)
Sodium: 138 mmol/L (ref 135–145)
Total Bilirubin: 0.4 mg/dL (ref 0.3–1.2)
Total Protein: 7.7 g/dL (ref 6.5–8.1)

## 2021-08-25 LAB — I-STAT BETA HCG BLOOD, ED (MC, WL, AP ONLY): I-stat hCG, quantitative: <5 m[IU]/mL (ref <5)

## 2021-08-25 LAB — LIPASE, BLOOD: Lipase: 46 U/L (ref 11–51)

## 2021-08-25 MED ORDER — PROMETHAZINE HCL 25 MG PO TABS: 25.0000 mg | ORAL_TABLET | Freq: Four times a day (QID) | ORAL | 0 refills | Status: DC | PRN

## 2021-08-25 MED ORDER — LIDOCAINE VISCOUS HCL 2 % MT SOLN
15.0000 mL | Freq: Once | OROMUCOSAL | Status: AC
  Administered 2021-08-25: 15 mL via ORAL
  Filled 2021-08-25: qty 15

## 2021-08-25 MED ORDER — ONDANSETRON HCL 4 MG/2ML IJ SOLN
4.0000 mg | Freq: Once | INTRAMUSCULAR | Status: AC
Start: 2021-08-25 — End: 2021-08-25
  Administered 2021-08-25: 4 mg via INTRAVENOUS
  Filled 2021-08-25: qty 2

## 2021-08-25 MED ORDER — ALUM & MAG HYDROXIDE-SIMETH 200-200-20 MG/5ML PO SUSP
30.0000 mL | Freq: Once | ORAL | Status: AC
  Administered 2021-08-25: 30 mL via ORAL
  Filled 2021-08-25: qty 30

## 2021-08-25 NOTE — ED Provider Notes (Signed)
[facility]-EMERGENCY DEPT Provider Note   CSN: [id number] Arrival date & time: 08/25/21  1931     History  Chief Complaint  Patient presents with   Emesis    Tammy Andrews is a 31 y.o. female who presents to the ED for evaluation of an ongoing complaint of diffuse abdominal pain, bilateral back pain with consistent nausea and vomiting for the last month.  Patient has been in the ED numerous times for the similar complaint.  Initially, she was diagnosed with cystitis and prescribed a dose of ciprofloxacin.  She thought that her symptoms are starting to get better, however her nausea and vomiting returned and worsened.  She continues to have pain described as pressure in her suprapubic region. On more recent visits, patient admits to marijuana use and has responded positively to droperidol, fluids along with Reglan and Pepcid.  She states that she has significantly decreased the amount that she is smoking because it makes her nauseous.  She denies fevers, chills, urinary symptoms, constipation.   Emesis     Home Medications Prior to Admission medications   Medication Sig Start Date End Date Taking? Authorizing Provider  promethazine (PHENERGAN) 25 MG tablet Take 1 tablet (25 mg total) by mouth every 6 (six) hours as needed for nausea or vomiting. 08/25/21  Yes Janell Quiet, PA-C  acetaminophen (TYLENOL) 500 MG tablet Take 1,000 mg by mouth every 6 (six) hours as needed for moderate pain.    [provider]  cephALEXin (KEFLEX) 500 MG capsule Take 1 capsule (500 mg total) by mouth 3 (three) times daily. 02/15/21   Jacalyn Lefevre, MD  ciprofloxacin (CIPRO) 500 MG tablet Take 1 tablet (500 mg total) by mouth every 12 (twelve) hours. 08/06/21   Prosperi, Christian H, PA-C  HYDROcodone-acetaminophen (NORCO/VICODIN) 5-325 MG tablet Take 1-2 tablets by mouth every 6 (six) hours as needed. 08/06/21   Prosperi, Christian H, PA-C  ibuprofen (ADVIL) 600 MG tablet Take 1  tablet (600 mg total) by mouth every 6 (six) hours as needed. 02/15/21   Jacalyn Lefevre, MD  metoCLOPramide (REGLAN) 10 MG tablet Take 1 tablet (10 mg total) by mouth every 6 (six) hours. 02/17/21   Henderly, Britni A, PA-C  metoCLOPramide (REGLAN) 10 MG tablet Take 1 tablet (10 mg total) by mouth every 6 (six) hours. 08/20/21   Hyman Hopes, Margaux, PA-C  ondansetron (ZOFRAN ODT) 4 MG disintegrating tablet Take 1 tablet (4 mg total) by mouth every 8 (eight) hours as needed for nausea or vomiting. 02/17/21   Henderly, Britni A, PA-C  ondansetron (ZOFRAN) 4 MG tablet Take 1 tablet (4 mg total) by mouth every 6 (six) hours. 08/06/21   Prosperi, Christian H, PA-C  oxyCODONE-acetaminophen (PERCOCET/ROXICET) 5-325 MG tablet Take 1 tablet by mouth every 6 (six) hours as needed for severe pain. 02/17/21   Henderly, Britni A, PA-C  promethazine (PHENERGAN) 25 MG suppository Place 1 suppository (25 mg total) rectally every 6 (six) hours as needed for nausea or vomiting. 08/20/21   Tanda Rockers, PA-C  promethazine (PHENERGAN) 25 MG tablet Take 1 tablet (25 mg total) by mouth every 8 (eight) hours as needed for nausea or vomiting. 02/15/21   Jacalyn Lefevre, MD  promethazine (PHENERGAN) 25 MG tablet Take 1 tablet (25 mg total) by mouth every 6 (six) hours as needed for nausea or vomiting. 08/02/21   Gailen Shelter, PA  haloperidol (HALDOL) 5 MG tablet Take 1 tablet (5 mg total) by mouth every 6 (six) hours as  needed (nausea or vomiting). 06/17/19 06/18/19  Dione BoozeGlick, David, MD      Allergies    Haldol [haloperidol lactate] and Pork-derived products    Review of Systems   Review of Systems  Gastrointestinal:  Positive for vomiting.   Physical Exam Updated Vital Signs BP 116/84    Pulse 70    Temp 98 F (36.7 C)    Resp 16    LMP 08/01/2021 (Exact Date) Comment: negative beta HCG 08/06/21   SpO2 100%  Physical Exam Vitals and nursing note reviewed.  Constitutional:      General: She is not in acute distress.    Appearance:  She is not ill-appearing.  HENT:     Head: Atraumatic.  Eyes:     Conjunctiva/sclera: Conjunctivae normal.  Cardiovascular:     Rate and Rhythm: Normal rate and regular rhythm.     Pulses: Normal pulses.     Heart sounds: No murmur heard. Pulmonary:     Effort: Pulmonary effort is normal. No respiratory distress.     Breath sounds: Normal breath sounds.  Abdominal:     General: Abdomen is flat. There is no distension.     Palpations: Abdomen is soft.     Tenderness: There is no abdominal tenderness.     Comments: Mild tenderness to palpation suprapubic region.  Remainder of abdominal exam is soft, nondistended.  She has bilateral CVA tenderness.  Negative Murphy sign, negative McBurney  Musculoskeletal:        General: Normal range of motion.     Cervical back: Normal range of motion.  Skin:    General: Skin is warm and dry.     Capillary Refill: Capillary refill takes less than 2 seconds.  Neurological:     General: No focal deficit present.     Mental Status: She is alert.  Psychiatric:        Mood and Affect: Mood normal.    ED Results / Procedures / Treatments   Labs (all labs ordered are listed, but only abnormal results are displayed) Labs Reviewed  COMPREHENSIVE METABOLIC PANEL - Abnormal; Notable for the following components:      Result Value   Potassium 3.2 (*)    Glucose, Bld 108 (*)    All other components within normal limits  URINALYSIS, ROUTINE W REFLEX MICROSCOPIC - Abnormal; Notable for the following components:   Color, Urine AMBER (*)    APPearance HAZY (*)    Bilirubin Urine SMALL (*)    Ketones, ur 5 (*)    Protein, ur 30 (*)    Leukocytes,Ua TRACE (*)    All other components within normal limits  LIPASE, BLOOD  CBC  I-STAT BETA HCG BLOOD, ED ([facility], [facility], [facility] ONLY)    EKG None  Radiology CT Renal Stone Study  Result Date: 08/25/2021 CLINICAL DATA:  Bilateral flank pain EXAM: CT ABDOMEN AND PELVIS WITHOUT CONTRAST TECHNIQUE: Multidetector CT  imaging of the abdomen and pelvis was performed following the standard protocol without IV contrast. RADIATION DOSE REDUCTION: This exam was performed according to the departmental dose-optimization program which includes automated exposure control, adjustment of the mA and/or kV according to patient size and/or use of iterative reconstruction technique. COMPARISON:  08/06/2021 FINDINGS: Lower chest: No acute abnormality. Hepatobiliary: No focal hepatic abnormality. Gallbladder unremarkable. Pancreas: No focal abnormality or ductal dilatation. Spleen: No focal abnormality.  Normal size. Adrenals/Urinary Tract: Punctate nonobstructing stones in the upper pole of the right kidney, 1 mm. No ureteral stones or  hydronephrosis. Adrenal glands and urinary bladder unremarkable. Stomach/Bowel: Normal appendix. Postoperative changes in the stomach. Stomach, large and small bowel grossly unremarkable. Vascular/Lymphatic: No evidence of aneurysm or adenopathy. Reproductive: Uterus and adnexa unremarkable.  No mass. Other: No free fluid or free air. Musculoskeletal: No acute bony abnormality. IMPRESSION: Punctate 1 mm nonobstructing right upper pole renal stones. No ureteral stones or hydronephrosis. No acute findings in the abdomen or pelvis. Electronically Signed   By: Charlett Nose M.D.   On: 08/25/2021 21:49    Procedures Procedures   Medications Ordered in ED Medications  alum & mag hydroxide-simeth (MAALOX/MYLANTA) 200-200-20 MG/5ML suspension 30 mL (30 mLs Oral Given 08/25/21 2109)    And  lidocaine (XYLOCAINE) 2 % viscous mouth solution 15 mL (15 mLs Oral Given 08/25/21 2110)  ondansetron (ZOFRAN) injection 4 mg (4 mg Intravenous Given 08/25/21 2110)    ED Course/ Medical Decision Making/ A&P                           Medical Decision Making Amount and/or Complexity of Data Reviewed Labs: ordered.  Risk OTC drugs. Prescription drug management.   History:  Per HPI  Initial impression:  This patient  presents to the ED for concern of vomiting and abdominal pain that radiates to the bilateral flanks, this involves an extensive number of treatment options, and is a complaint that carries with it a high risk of complications and morbidity.   Differentials include cannabinoid hyperemesis syndrome versus UTI versus pyleonephritis versus renal stone.  Multiple ER visits over the last month for ongoing symptoms.  Most recently diagnosed with cannabinoid hyperemesis syndrome, patient continues to smoke although at a decreased rate.   ED Course: On arrival, patient is afebrile, nontachycardic, not on tachypneic.  She has suprapubic abdominal tenderness with bilateral CVA tenderness.  CT scan on 08/06/2021 without any acute findings but was notable for nonobstructing right upper pole renal stone.  Patient has had numerous urinalysis on every visit and has consistently had trace to moderate leukocytes without other evidence of infection.  On 08/19/2021 she was diagnosed with trichomonas as well.  On UA today, she has trace leukocytes without bacteria or RBC.  Trichomonas is negative.  CVA tenderness was concerning for possible kidney infection given that she also had leukocytes on her UA today, however CT renal without evidence of infection.  She does have a small nonobstructing stone previously noted on CT of 08/03/2021 that does not account for her current symptoms. CMP, lipase, CBC and pregnancy test were all unremarkable.  Patient responded well with GI cocktail and Zofran injection.  Lab Tests and EKG:  I Ordered, reviewed, and interpreted labs and EKG.  The pertinent results are indicated in the ED course above.   Imaging Studies ordered:  I ordered imaging studies including CT renal stone study with results above. I independently visualized and interpreted imaging and I agree with the radiologist interpretation.    Cardiac Monitoring:  The patient was maintained on a cardiac monitor.  I personally  viewed and interpreted the cardiac monitored which showed an underlying rhythm of: NSR   Medicines ordered and prescription drug management:  I ordered medication including: GI cocktail for nausea and abdominal pain Zofran 4 mg injection for nausea Reevaluation of the patient after these medicines showed that the patient improved I have reviewed the patients home medicines and have made adjustments as needed   Disposition:  After consideration of the diagnostic results,  physical exam, history and the patients response to treatment feel that the patent would benefit from discharge with outpatient follow-up.   Vomiting, nausea Cannabinoid hyperemesis syndrome: At this point, patient has had several extensive work-ups in the emergency department over the last month.  I have strongly urged her to discontinue marijuana use as this is likely triggering her nausea and vomiting, however patient feels that this is unlikely that it would start at age 61.  I assured her that it was possible.  However, I did provide her a GI referral to follow-up with.  From an emergency standpoint, we have worked her up several times without any significant findings and its likely that if this continues she needs to follow-up with GI for more advanced work-up and evaluation.  Patient expresses understanding and is amenable to plan.  She was discharged home in good condition.  Final Clinical Impression(s) / ED Diagnoses Final diagnoses:  Nausea and vomiting, unspecified vomiting type  Cannabinoid hyperemesis syndrome    Rx / DC Orders ED Discharge Orders          Ordered    promethazine (PHENERGAN) 25 MG tablet  Every 6 hours PRN        08/25/21 2258              Delight Ovens 08/25/21 2308    Linwood Dibbles, MD 08/28/21 [phone]

## 2021-08-25 NOTE — ED Notes (Signed)
Pt transported to CT ?

## 2021-08-25 NOTE — ED Notes (Signed)
Pt returned from CT °

## 2021-08-25 NOTE — ED Triage Notes (Signed)
Patient states she was seen recently and treated for a kidney infection, states she was prescribed abx and finished course but is not getting any better. Endorses pain all over and intermittent episodes of N/V x 3 days.

## 2021-08-25 NOTE — Discharge Instructions (Addendum)
You workup today was very reassuring. Your CT was negative for kidney infection and for renal stones. Your labs were overall normal. At this point, either you have a syndrome called cannabinoid hyperemesis syndrome, in which you react very strongly to the ingestion of cannabis.  I provided some information above for you to read through about this, and I recommend that you hold off on marijuana use for now.  If you strongly feel that this is not correct, I have provided you a referral to a GI specialist who can do a more advanced work-up.  We have done several extensive work-ups here in the emergency department, and unfortunately we cannot find a definitive cause for your nausea and vomiting and you may require specialty care.

## 2021-08-25 NOTE — ED Notes (Signed)
Attempted blood draw x2 unsuccessful 

## 2022-03-08 ENCOUNTER — Encounter (HOSPITAL_BASED_OUTPATIENT_CLINIC_OR_DEPARTMENT_OTHER): Payer: Self-pay | Admitting: Emergency Medicine

## 2022-03-08 ENCOUNTER — Emergency Department (HOSPITAL_BASED_OUTPATIENT_CLINIC_OR_DEPARTMENT_OTHER): Payer: Medicaid Other

## 2022-03-08 ENCOUNTER — Emergency Department (HOSPITAL_BASED_OUTPATIENT_CLINIC_OR_DEPARTMENT_OTHER)
Admission: EM | Admit: 2022-03-08 | Discharge: 2022-03-08 | Disposition: A | Discharge status: Home / Self Care | Payer: Medicaid Other | Attending: Emergency Medicine | Admitting: Emergency Medicine

## 2022-03-08 ENCOUNTER — Other Ambulatory Visit: Payer: Self-pay

## 2022-03-08 DIAGNOSIS — D72829 Elevated white blood cell count, unspecified: Secondary | ICD-10-CM | POA: Insufficient documentation

## 2022-03-08 DIAGNOSIS — R112 Nausea with vomiting, unspecified: Secondary | ICD-10-CM | POA: Diagnosis present

## 2022-03-08 DIAGNOSIS — R1032 Left lower quadrant pain: Secondary | ICD-10-CM | POA: Insufficient documentation

## 2022-03-08 DIAGNOSIS — R1110 Vomiting, unspecified: Secondary | ICD-10-CM

## 2022-03-08 LAB — URINALYSIS, ROUTINE W REFLEX MICROSCOPIC
Glucose, UA: NEGATIVE mg/dL
Hgb urine dipstick: NEGATIVE
Ketones, ur: 80 mg/dL — AB
Leukocytes,Ua: NEGATIVE
Nitrite: NEGATIVE
Protein, ur: 100 mg/dL — AB
Specific Gravity, Urine: 1.025 (ref 1.005–1.030)
pH: 6 (ref 5.0–8.0)

## 2022-03-08 LAB — COMPREHENSIVE METABOLIC PANEL
ALT: 23 U/L (ref 0–44)
AST: 30 U/L (ref 15–41)
Albumin: 4.5 g/dL (ref 3.5–5.0)
Alkaline Phosphatase: 61 U/L (ref 38–126)
Anion gap: 12 (ref 5–15)
BUN: 18 mg/dL (ref 6–20)
CO2: 22 mmol/L (ref 22–32)
Calcium: 9.8 mg/dL (ref 8.9–10.3)
Chloride: 104 mmol/L (ref 98–111)
Creatinine, Ser: 0.98 mg/dL (ref 0.44–1.00)
GFR, Estimated: >60 mL/min (ref >60)
Glucose, Bld: 114 mg/dL — ABNORMAL HIGH (ref 70–99)
Potassium: 3.8 mmol/L (ref 3.5–5.1)
Sodium: 138 mmol/L (ref 135–145)
Total Bilirubin: 0.8 mg/dL (ref 0.3–1.2)
Total Protein: 8.8 g/dL — ABNORMAL HIGH (ref 6.5–8.1)

## 2022-03-08 LAB — URINALYSIS, MICROSCOPIC (REFLEX): RBC / HPF: NONE SEEN RBC/hpf (ref 0–5)

## 2022-03-08 LAB — CBC
HCT: 42.6 % (ref 36.0–46.0)
Hemoglobin: 15 g/dL (ref 12.0–15.0)
MCH: 31.3 pg (ref 26.0–34.0)
MCHC: 35.2 g/dL (ref 30.0–36.0)
MCV: 88.9 fL (ref 80.0–100.0)
Platelets: 261 10*3/uL (ref 150–400)
RBC: 4.79 MIL/uL (ref 3.87–5.11)
RDW: 12.1 % (ref 11.5–15.5)
WBC: 10.8 10*3/uL — ABNORMAL HIGH (ref 4.0–10.5)
nRBC: 0 % (ref 0.0–0.2)

## 2022-03-08 LAB — PREGNANCY, URINE: Preg Test, Ur: NEGATIVE

## 2022-03-08 LAB — LIPASE, BLOOD: Lipase: 39 U/L (ref 11–51)

## 2022-03-08 MED ORDER — DIPHENHYDRAMINE HCL 50 MG/ML IJ SOLN
12.5000 mg | Freq: Once | INTRAMUSCULAR | Status: AC
  Administered 2022-03-08: 12.5 mg via INTRAVENOUS
  Filled 2022-03-08: qty 1

## 2022-03-08 MED ORDER — MORPHINE SULFATE (PF) 4 MG/ML IV SOLN
4.0000 mg | Freq: Once | INTRAVENOUS | Status: AC
  Administered 2022-03-08: 4 mg via INTRAVENOUS
  Filled 2022-03-08: qty 1

## 2022-03-08 MED ORDER — ACETAMINOPHEN 500 MG PO TABS
1000.0000 mg | ORAL_TABLET | Freq: Once | ORAL | Status: AC
  Administered 2022-03-08: 1000 mg via ORAL
  Filled 2022-03-08: qty 2

## 2022-03-08 MED ORDER — ONDANSETRON HCL 4 MG PO TABS
4.0000 mg | ORAL_TABLET | Freq: Every day | ORAL | 1 refills | Status: AC | PRN
  Filled 2022-03-08: qty 30, 30d supply, fill #0

## 2022-03-08 MED ORDER — METOCLOPRAMIDE HCL 5 MG/ML IJ SOLN
10.0000 mg | Freq: Once | INTRAMUSCULAR | Status: AC
Start: 2022-03-08 — End: 2022-03-08
  Administered 2022-03-08: 10 mg via INTRAVENOUS
  Filled 2022-03-08: qty 2

## 2022-03-08 MED ORDER — ONDANSETRON 4 MG PO TBDP
4.0000 mg | ORAL_TABLET | Freq: Once | ORAL | Status: AC | PRN
Start: 2022-03-08 — End: 2022-03-08
  Administered 2022-03-08: 4 mg via ORAL
  Filled 2022-03-08: qty 1

## 2022-03-08 MED ORDER — ACETAMINOPHEN ER 650 MG PO TBCR
650.0000 mg | EXTENDED_RELEASE_TABLET | Freq: Three times a day (TID) | ORAL | 0 refills | Status: AC | PRN
  Filled 2022-03-08: qty 50, 17d supply, fill #0

## 2022-03-08 MED ORDER — IOHEXOL 300 MG/ML  SOLN
100.0000 mL | Freq: Once | INTRAMUSCULAR | Status: AC | PRN
  Administered 2022-03-08: 100 mL via INTRAVENOUS

## 2022-03-08 MED ORDER — LACTATED RINGERS IV BOLUS
1000.0000 mL | Freq: Once | INTRAVENOUS | Status: AC
  Administered 2022-03-08: 1000 mL via INTRAVENOUS

## 2022-03-08 NOTE — ED Provider Notes (Signed)
[facility] EMERGENCY DEPARTMENT Provider Note   CSN: [id number] Arrival date & time: 03/08/22  1339     History  Chief Complaint  Patient presents with   Emesis    Tammy Andrews is a 31 y.o. female with history of gastric bypass presents with complaints of nausea, vomiting, abdominal pain.  Started suddenly Monday morning.  Has been vomiting incessantly since then.  Recent emesis has been clear and yellow.  She was able to keep a couple of bites of chicken and rice down yesterday.  Last bowel movement was this morning.  It was loose.  Abdominal pain is left lower quadrant.  Nonradiating.  Associated with some back pain on the opposite side.  No recent sick contacts.  LMP late July.  Review of systems positive for some chills.  Denies blood in emesis or stool, dysuria, increased urinary frequency, chest pain, shortness of breath.  Surgical history: Gastric bypass  Social history: Lives locally with husband and daughter Uses marijuana occasionally   Emesis      Home Medications Prior to Admission medications   Medication Sig Start Date End Date Taking? Authorizing Provider  acetaminophen (TYLENOL 8 HOUR) 650 MG CR tablet Take 1 tablet (650 mg total) by mouth every 8 (eight) hours as needed for pain. 03/08/22 04/07/22 Yes Marrianne Mood, MD  ondansetron (ZOFRAN) 4 MG tablet Take 1 tablet (4 mg total) by mouth daily as needed for nausea or vomiting. 03/08/22 03/08/23 Yes Marrianne Mood, MD  cephALEXin (KEFLEX) 500 MG capsule Take 1 capsule (500 mg total) by mouth 3 (three) times daily. 02/15/21   Jacalyn Lefevre, MD  ciprofloxacin (CIPRO) 500 MG tablet Take 1 tablet (500 mg total) by mouth every 12 (twelve) hours. 08/06/21   Prosperi, Christian H, PA-C  HYDROcodone-acetaminophen (NORCO/VICODIN) 5-325 MG tablet Take 1-2 tablets by mouth every 6 (six) hours as needed. 08/06/21   Prosperi, Christian H, PA-C  ibuprofen (ADVIL) 600 MG tablet Take 1 tablet (600 mg total) by  mouth every 6 (six) hours as needed. 02/15/21   Jacalyn Lefevre, MD  metoCLOPramide (REGLAN) 10 MG tablet Take 1 tablet (10 mg total) by mouth every 6 (six) hours. 02/17/21   Henderly, Britni A, PA-C  metoCLOPramide (REGLAN) 10 MG tablet Take 1 tablet (10 mg total) by mouth every 6 (six) hours. 08/20/21   Hyman Hopes, Margaux, PA-C  ondansetron (ZOFRAN ODT) 4 MG disintegrating tablet Take 1 tablet (4 mg total) by mouth every 8 (eight) hours as needed for nausea or vomiting. 02/17/21   Henderly, Britni A, PA-C  oxyCODONE-acetaminophen (PERCOCET/ROXICET) 5-325 MG tablet Take 1 tablet by mouth every 6 (six) hours as needed for severe pain. 02/17/21   Henderly, Britni A, PA-C  promethazine (PHENERGAN) 25 MG suppository Place 1 suppository (25 mg total) rectally every 6 (six) hours as needed for nausea or vomiting. 08/20/21   Tanda Rockers, PA-C  promethazine (PHENERGAN) 25 MG tablet Take 1 tablet (25 mg total) by mouth every 8 (eight) hours as needed for nausea or vomiting. 02/15/21   Jacalyn Lefevre, MD  promethazine (PHENERGAN) 25 MG tablet Take 1 tablet (25 mg total) by mouth every 6 (six) hours as needed for nausea or vomiting. 08/02/21   Gailen Shelter, PA  promethazine (PHENERGAN) 25 MG tablet Take 1 tablet (25 mg total) by mouth every 6 (six) hours as needed for nausea or vomiting. 08/25/21   Janell Quiet, PA-C  haloperidol (HALDOL) 5 MG tablet Take 1 tablet (5 mg total) by mouth every  6 (six) hours as needed (nausea or vomiting). 06/17/19 06/18/19  Dione Booze, MD      Allergies    Haldol [haloperidol lactate] and Pork-derived products    Review of Systems   Review of Systems  Gastrointestinal:  Positive for vomiting.  All other systems reviewed and are negative.   Physical Exam Updated Vital Signs BP 127/85   Pulse 67   Temp 99 F (37.2 C) (Oral)   Resp 18   Ht 5\' 7"  (1.702 m)   Wt 106.6 kg   LMP 02/08/2022 (Approximate)   SpO2 100%   BMI 36.81 kg/m  Physical Exam Vitals and nursing note  reviewed.  Constitutional:      General: She is in acute distress.     Appearance: She is well-developed.  HENT:     Head: Normocephalic and atraumatic.  Eyes:     Conjunctiva/sclera: Conjunctivae normal.  Cardiovascular:     Rate and Rhythm: Normal rate and regular rhythm.     Heart sounds: No murmur heard. Pulmonary:     Effort: Pulmonary effort is normal. No respiratory distress.     Breath sounds: Normal breath sounds.  Abdominal:     Palpations: Abdomen is soft.     Tenderness: There is abdominal tenderness.     Comments: Epigastric tenderness.  Left lower quadrant tenderness to deep palpation.  Musculoskeletal:        General: No swelling.     Cervical back: Neck supple.  Skin:    General: Skin is warm and dry.     Capillary Refill: Capillary refill takes less than 2 seconds.  Neurological:     Mental Status: She is alert.  Psychiatric:        Mood and Affect: Mood normal.     ED Results / Procedures / Treatments   Labs (all labs ordered are listed, but only abnormal results are displayed) Labs Reviewed  COMPREHENSIVE METABOLIC PANEL - Abnormal; Notable for the following components:      Result Value   Glucose, Bld 114 (*)    Total Protein 8.8 (*)    All other components within normal limits  CBC - Abnormal; Notable for the following components:   WBC 10.8 (*)    All other components within normal limits  URINALYSIS, ROUTINE W REFLEX MICROSCOPIC - Abnormal; Notable for the following components:   Color, Urine AMBER (*)    APPearance HAZY (*)    Bilirubin Urine MODERATE (*)    Ketones, ur 80 (*)    Protein, ur 100 (*)    All other components within normal limits  URINALYSIS, MICROSCOPIC (REFLEX) - Abnormal; Notable for the following components:   Bacteria, UA RARE (*)    All other components within normal limits  LIPASE, BLOOD  PREGNANCY, URINE    EKG None  Radiology CT ABDOMEN PELVIS W CONTRAST  Result Date: 03/08/2022 CLINICAL DATA:  LEFT lower  quadrant pain.  Acute abdominal pain EXAM: CT ABDOMEN AND PELVIS WITH CONTRAST TECHNIQUE: Multidetector CT imaging of the abdomen and pelvis was performed using the standard protocol following bolus administration of intravenous contrast. RADIATION DOSE REDUCTION: This exam was performed according to the departmental dose-optimization program which includes automated exposure control, adjustment of the mA and/or kV according to patient size and/or use of iterative reconstruction technique. CONTRAST:  03/10/2022 OMNIPAQUE IOHEXOL 300 MG/ML  SOLN COMPARISON:  None Available. FINDINGS: Lower chest: Lung bases are clear. Hepatobiliary: No focal hepatic lesion. No biliary duct dilatation. Common bile duct  is normal. Pancreas: Pancreas is normal. No ductal dilatation. No pancreatic inflammation. Spleen: Normal spleen Adrenals/urinary tract: Adrenal glands and kidneys are normal. The ureters and bladder normal. Stomach/Bowel: Surgical clips along the stomach. No obstruction. Normal small bowel without dilatation or obstruction. Appendix normal. Ascending transverse colon normal. Descending colon normal. Not diverticulosis. Rectosigmoid colon normal Vascular/Lymphatic: Abdominal aorta is normal caliber. There is no retroperitoneal or periportal lymphadenopathy. No pelvic lymphadenopathy. Reproductive: Uterus and adnexa unremarkable. Other: No inguinal hernia or abdominal hernia. Musculoskeletal: No aggressive osseous lesion. IMPRESSION: No explanation for LEFT lower quadrant pain Post bariatric surgery without complicating features. Normal appendix No obstructive uropathy Electronically Signed   By: Genevive Bi M.D.   On: 03/08/2022 20:43    Procedures Procedures    Medications Ordered in ED Medications  ondansetron (ZOFRAN-ODT) disintegrating tablet 4 mg (4 mg Oral Given 03/08/22 1412)  metoCLOPramide (REGLAN) injection 10 mg (10 mg Intravenous Given 03/08/22 1949)  lactated ringers bolus 1,000 mL (0 mLs  Intravenous Stopped 03/08/22 2136)  acetaminophen (TYLENOL) tablet 1,000 mg (1,000 mg Oral Given 03/08/22 1945)  diphenhydrAMINE (BENADRYL) injection 12.5 mg (12.5 mg Intravenous Given 03/08/22 1947)  iohexol (OMNIPAQUE) 300 MG/ML solution 100 mL (100 mLs Intravenous Contrast Given 03/08/22 2025)  morphine (PF) 4 MG/ML injection 4 mg (4 mg Intravenous Given 03/08/22 2104)    ED Course/ Medical Decision Making/ A&P Clinical Course as of 03/08/22 2357  Tue Mar 08, 2022  2058 CT ABDOMEN PELVIS W CONTRAST Normal appendix. No signs of colitis or diverticulitis. No findings to explain the patient's current clinical syndrome.  [MM]  2101 Preg Test, Ur: NEGATIVE [MM]    Clinical Course User Index [MM] Marrianne Mood, MD                           Medical Decision Making Amount and/or Complexity of Data Reviewed Labs: ordered. Decision-making details documented in ED Course. Radiology: ordered. Decision-making details documented in ED Course.  Risk OTC drugs. Prescription drug management.   This patient is 31 year old female with history of gastric bypass who presents with 3 days of nausea, vomiting, abdominal pain.  Last BM was this morning.  Exam is notable for some tenderness in the epigastric region and left lower quadrant.  However no signs of peritonitis.  Afebrile and hemodynamically stable on presentation to the ED.  Laboratory work-up notable for leukocytosis to 10.8.  Lipase within normal limits.  Do not suspect obstruction given bowel movements as of this morning.  Left lower quadrant abdominal pain could be due to colitis, diverticulitis.  Less concern for cholecystitis and appendicitis given localization of the pain.  Lipase is normal thus probability of pancreatitis is low.  Possible that this is a complication of her gastric banding procedure.  We will see how she responds to treatment.  Follow-up urine pregnancy and urinalysis.  Order CT abdomen pelvis with contrast.  Treat  symptomatically with fluids and metoclopramide, Zofran does not usually work for this patient. Patient condition improved with treatment.  Urine pregnancy test was negative.  UA was unremarkable.   Co morbidities that complicate the patient evaluation  Obesity history of gastric banding. Marijuana use disorder   Social Determinants of Health:  Uses marijuana recreationally   Additional history obtained:  Additional history and/or information obtained from chart review External records from outside source obtained and reviewed including charts from prior counters in the emergency department   Lab Tests:  I Ordered (or co-signed),  and personally interpreted labs.  The pertinent results include: Urine pregnancy test negative.  UA without signs of infection.  Electrolytes within normal limits.  Lipase within normal limits. CBC with mild leukocytosis 10.8.   Imaging Studies ordered:  I ordered (or co-signed) imaging studies including CT abdomen pelvis with contrast I independently visualized and interpreted imaging which showed no evidence of cholecystitis, appendicitis, diverticulitis or colitis.  No abnormalities to explain the patient's current clinical syndrome. I agree with the radiologist interpretation   Cardiac Monitoring:  The patient was maintained on a cardiac monitor.  The cardiac monitored showed an rhythm of normal sinus rhythm. The patient was also maintained on pulse oximetry. The readings were typically within normal limits.   Medicines ordered and prescription drug management:  I ordered medication including LR bolus, metoclopramide, diphenhydramine, acetaminophen, ondansetron, morphine Reevaluation of the patient after these medicines showed that the patient improved  Reevaluation:  After the interventions noted above, I reevaluated the patient and found that they have :improved.    Dispostion:  After consideration of the diagnostic results and the  patients response to treatment, I feel that the patent would benefit from discharge home and follow-up with GI.          Final Clinical Impression(s) / ED Diagnoses Final diagnoses:  Uncontrollable vomiting  Left lower quadrant abdominal pain    Rx / DC Orders ED Discharge Orders          Ordered    Ambulatory referral to Gastroenterology       Comments: Had prior referral but appointment fell during a lapse in this patient's insurance as she was in between jobs at that time.   03/08/22 2129    acetaminophen (TYLENOL 8 HOUR) 650 MG CR tablet  Every 8 hours PRN        03/08/22 2129    ondansetron (ZOFRAN) 4 MG tablet  Daily PRN        03/08/22 2129              Marrianne Mood, MD 03/09/22 0002    Tegeler, Canary Brim, MD 03/09/22 [phone]

## 2022-03-08 NOTE — Discharge Instructions (Addendum)
Your evaluated in the emergency department for nausea, vomiting, and abdominal pain.  The results of your evaluation were reassuring.  You had a CT scan of your abdomen and pelvis that did not show any signs of inflammation, infection, obstruction, or masses in the abdomen.  You were treated for nausea and pain in the emergency department with improvement.  It is safe for you to go home.  I recommend following up with a gastroenterologist for chronic nausea and vomiting.  Please return to the emergency department for signs and symptoms of severe illness including, but not limited to: New or worsening abdominal pain, fevers greater than 100.4 F, blood in your vomit or stool, confusion, fainting, chest pain, shortness of breath.

## 2022-03-08 NOTE — ED Triage Notes (Signed)
Recurrent emesis x 3 days . Hx of . Had GI appointment , missed it due to insurance . Pt in tears and restless during triage .

## 2022-03-08 NOTE — ED Notes (Signed)
UA-pt is aware

## 2022-03-09 ENCOUNTER — Other Ambulatory Visit (HOSPITAL_BASED_OUTPATIENT_CLINIC_OR_DEPARTMENT_OTHER): Payer: Self-pay

## 2022-03-17 ENCOUNTER — Other Ambulatory Visit (HOSPITAL_BASED_OUTPATIENT_CLINIC_OR_DEPARTMENT_OTHER): Payer: Self-pay

## 2022-07-25 ENCOUNTER — Encounter: Payer: Self-pay | Admitting: Emergency Medicine

## 2023-05-23 ENCOUNTER — Other Ambulatory Visit: Payer: Self-pay

## 2023-05-23 ENCOUNTER — Emergency Department (HOSPITAL_BASED_OUTPATIENT_CLINIC_OR_DEPARTMENT_OTHER)
Admission: EM | Admit: 2023-05-23 | Discharge: 2023-05-23 | Disposition: A | Discharge status: Home / Self Care | Payer: Medicaid Other | Attending: Emergency Medicine | Admitting: Emergency Medicine

## 2023-05-23 ENCOUNTER — Emergency Department (HOSPITAL_BASED_OUTPATIENT_CLINIC_OR_DEPARTMENT_OTHER): Payer: Medicaid Other

## 2023-05-23 ENCOUNTER — Encounter (HOSPITAL_BASED_OUTPATIENT_CLINIC_OR_DEPARTMENT_OTHER): Payer: Self-pay | Admitting: Urology

## 2023-05-23 DIAGNOSIS — K8050 Calculus of bile duct without cholangitis or cholecystitis without obstruction: Secondary | ICD-10-CM | POA: Insufficient documentation

## 2023-05-23 DIAGNOSIS — K838 Other specified diseases of biliary tract: Secondary | ICD-10-CM

## 2023-05-23 DIAGNOSIS — D72829 Elevated white blood cell count, unspecified: Secondary | ICD-10-CM | POA: Insufficient documentation

## 2023-05-23 DIAGNOSIS — R112 Nausea with vomiting, unspecified: Secondary | ICD-10-CM

## 2023-05-23 LAB — URINALYSIS, ROUTINE W REFLEX MICROSCOPIC
Glucose, UA: NEGATIVE mg/dL
Hgb urine dipstick: NEGATIVE
Ketones, ur: 80 mg/dL — AB
Nitrite: NEGATIVE
Protein, ur: 30 mg/dL — AB
Specific Gravity, Urine: 1.025 (ref 1.005–1.030)
pH: 6 (ref 5.0–8.0)

## 2023-05-23 LAB — CBC
HCT: 41.9 % (ref 36.0–46.0)
Hemoglobin: 14.3 g/dL (ref 12.0–15.0)
MCH: 31.1 pg (ref 26.0–34.0)
MCHC: 34.1 g/dL (ref 30.0–36.0)
MCV: 91.1 fL (ref 80.0–100.0)
Platelets: 290 10*3/uL (ref 150–400)
RBC: 4.6 MIL/uL (ref 3.87–5.11)
RDW: 12.1 % (ref 11.5–15.5)
WBC: 12 10*3/uL — ABNORMAL HIGH (ref 4.0–10.5)
nRBC: 0 % (ref 0.0–0.2)

## 2023-05-23 LAB — COMPREHENSIVE METABOLIC PANEL
ALT: 19 U/L (ref 0–44)
AST: 27 U/L (ref 15–41)
Albumin: 4.3 g/dL (ref 3.5–5.0)
Alkaline Phosphatase: 51 U/L (ref 38–126)
Anion gap: 15 (ref 5–15)
BUN: 18 mg/dL (ref 6–20)
CO2: 23 mmol/L (ref 22–32)
Calcium: 9.3 mg/dL (ref 8.9–10.3)
Chloride: 101 mmol/L (ref 98–111)
Creatinine, Ser: 0.93 mg/dL (ref 0.44–1.00)
GFR, Estimated: >60 mL/min (ref >60)
Glucose, Bld: 93 mg/dL (ref 70–99)
Potassium: 3.3 mmol/L — ABNORMAL LOW (ref 3.5–5.1)
Sodium: 139 mmol/L (ref 135–145)
Total Bilirubin: 1.4 mg/dL — ABNORMAL HIGH (ref <1.2)
Total Protein: 8.6 g/dL — ABNORMAL HIGH (ref 6.5–8.1)

## 2023-05-23 LAB — URINALYSIS, MICROSCOPIC (REFLEX)

## 2023-05-23 LAB — LIPASE, BLOOD: Lipase: 28 U/L (ref 11–51)

## 2023-05-23 LAB — PREGNANCY, URINE: Preg Test, Ur: NEGATIVE

## 2023-05-23 MED ORDER — IOHEXOL 300 MG/ML  SOLN
100.0000 mL | Freq: Once | INTRAMUSCULAR | Status: AC | PRN
  Administered 2023-05-23: 100 mL via INTRAVENOUS

## 2023-05-23 MED ORDER — FLUCONAZOLE 150 MG PO TABS: 150.0000 mg | ORAL_TABLET | Freq: Once | ORAL | 0 refills | Status: AC

## 2023-05-23 MED ORDER — ONDANSETRON 4 MG PO TBDP
4.0000 mg | ORAL_TABLET | Freq: Once | ORAL | Status: AC
  Administered 2023-05-23: 4 mg via ORAL
  Filled 2023-05-23: qty 1

## 2023-05-23 MED ORDER — ONDANSETRON HCL 4 MG PO TABS: 4.0000 mg | ORAL_TABLET | Freq: Three times a day (TID) | ORAL | 0 refills | Status: AC | PRN

## 2023-05-23 MED ORDER — POTASSIUM CHLORIDE 10 MEQ/100ML IV SOLN
10.0000 meq | Freq: Once | INTRAVENOUS | Status: AC
  Administered 2023-05-23: 10 meq via INTRAVENOUS
  Filled 2023-05-23: qty 100

## 2023-05-23 MED ORDER — MORPHINE SULFATE (PF) 4 MG/ML IV SOLN
4.0000 mg | Freq: Once | INTRAVENOUS | Status: AC
  Administered 2023-05-23: 4 mg via INTRAVENOUS
  Filled 2023-05-23: qty 1

## 2023-05-23 MED ORDER — LACTATED RINGERS IV BOLUS
1000.0000 mL | Freq: Once | INTRAVENOUS | Status: AC
  Administered 2023-05-23: 1000 mL via INTRAVENOUS

## 2023-05-23 MED ORDER — OXYCODONE-ACETAMINOPHEN 5-325 MG PO TABS: 1.0000 | ORAL_TABLET | Freq: Three times a day (TID) | ORAL | 0 refills | Status: AC | PRN

## 2023-05-23 NOTE — ED Provider Triage Note (Signed)
Emergency Medicine Provider Triage Evaluation Note  Astoria Condon , a 32 y.o. female  was evaluated in triage.  Pt complains of nausea, vomiting, and suprapubic discomfort, onset Saturday. Persistent vomiting, poor oral intake. Scant urine production.  Review of Systems  Positive: Nausea, vomiting, suprapubic pain Negative: Fever, chills  Physical Exam  BP 110/82 (BP Location: Left Arm)   Pulse 76   Temp 98.4 F (36.9 C)   Resp 18   Ht 5\' 7"  (1.702 m)   Wt 92.5 kg   LMP 05/02/2023   SpO2 97%   BMI 31.95 kg/m  Gen:   Awake, appears uncomfortable Resp:  Normal effort  MSK:   Moves extremities without difficulty  Other:    Medical Decision Making  Medically screening exam initiated at 2:46 PM.  Appropriate orders placed.  Paz Fuentes was informed that the remainder of the evaluation will be completed by another provider, this initial triage assessment does not replace that evaluation, and the importance of remaining in the ED until their evaluation is complete.     Felicie Morn, NP 05/23/23 [phone]

## 2023-05-23 NOTE — ED Notes (Signed)
Attempt at IV x 1 with no result, blood return but unable to advance. Pressure dressing applied

## 2023-05-23 NOTE — ED Provider Notes (Signed)
[facility] Provider Note   CSN: [id number] Arrival date & time: 05/23/23  1427     History  Chief Complaint  Patient presents with   Emesis    Tammy Andrews is a 32 y.o. female history of gastric bypass, was seen at least 8 or 9 times last year in ED for similar symptoms of nausea and vomiting, finally diagnosed with cannabinoid hyperemesis syndrome.  Presently, Patient reports nausea and vomiting started 4 days ago, nonbloody, nonbilious, mostly yellow.  She is unable to keep any solids or liquids down.  Patient reports lower abdominal pain, radiating to her sides.  Patient reports her last bowel movement was 4 days ago, it was nonbloody, mostly watery.  Review of systems positive for chills.  Patient denies any dysuria, hematuria, denies any vaginal discharge.  Last menstrual period was in October 15.  Patient denies any chest pain, shortness of breath, cough, congestion, sore throat, headaches.  Denies any recent NSAID use.  Patient reports that she smokes marijuana occasionally, last time she smoked was last week Wednesday.   Home Medications Prior to Admission medications   Medication Sig Start Date End Date Taking? Authorizing Provider  cephALEXin (KEFLEX) 500 MG capsule Take 1 capsule (500 mg total) by mouth 3 (three) times daily. 02/15/21   Jacalyn Lefevre, MD  ciprofloxacin (CIPRO) 500 MG tablet Take 1 tablet (500 mg total) by mouth every 12 (twelve) hours. 08/06/21   Prosperi, Christian H, PA-C  HYDROcodone-acetaminophen (NORCO/VICODIN) 5-325 MG tablet Take 1-2 tablets by mouth every 6 (six) hours as needed. 08/06/21   Prosperi, Christian H, PA-C  ibuprofen (ADVIL) 600 MG tablet Take 1 tablet (600 mg total) by mouth every 6 (six) hours as needed. 02/15/21   Jacalyn Lefevre, MD  metoCLOPramide (REGLAN) 10 MG tablet Take 1 tablet (10 mg total) by mouth every 6 (six) hours. 02/17/21   Henderly, Britni A, PA-C  metoCLOPramide (REGLAN) 10 MG  tablet Take 1 tablet (10 mg total) by mouth every 6 (six) hours. 08/20/21   Hyman Hopes, Margaux, PA-C  ondansetron (ZOFRAN ODT) 4 MG disintegrating tablet Take 1 tablet (4 mg total) by mouth every 8 (eight) hours as needed for nausea or vomiting. 02/17/21   Henderly, Britni A, PA-C  oxyCODONE-acetaminophen (PERCOCET/ROXICET) 5-325 MG tablet Take 1 tablet by mouth every 6 (six) hours as needed for severe pain. 02/17/21   Henderly, Britni A, PA-C  promethazine (PHENERGAN) 25 MG suppository Place 1 suppository (25 mg total) rectally every 6 (six) hours as needed for nausea or vomiting. 08/20/21   Tanda Rockers, PA-C  promethazine (PHENERGAN) 25 MG tablet Take 1 tablet (25 mg total) by mouth every 8 (eight) hours as needed for nausea or vomiting. 02/15/21   Jacalyn Lefevre, MD  promethazine (PHENERGAN) 25 MG tablet Take 1 tablet (25 mg total) by mouth every 6 (six) hours as needed for nausea or vomiting. 08/02/21   Gailen Shelter, PA  promethazine (PHENERGAN) 25 MG tablet Take 1 tablet (25 mg total) by mouth every 6 (six) hours as needed for nausea or vomiting. 08/25/21   Janell Quiet, PA-C  haloperidol (HALDOL) 5 MG tablet Take 1 tablet (5 mg total) by mouth every 6 (six) hours as needed (nausea or vomiting). 06/17/19 06/18/19  Dione Booze, MD      Allergies    Haldol [haloperidol lactate] and Pork-derived products    Review of Systems   Review of Systems  Gastrointestinal:  Positive for vomiting.  Physical Exam Updated Vital Signs BP 112/69   Pulse 73   Temp 98.4 F (36.9 C)   Resp 12   Ht 5\' 7"  (1.702 m)   Wt 92.5 kg   LMP 05/02/2023   SpO2 100%   BMI 31.95 kg/m   Physical Exam Gen: Appears in pain, curled up in bed. Cardiovascular: Normal rate, normal rhythm, no murmurs Pulmonary: Normal work of breathing, no crackles or wheezing Abd: Tenderness to palpation of the lower abdomen, sounds present MSK: Tenderness to palpation paraspinal muscles lower lumbar region, bilaterally   ED  Results / Procedures / Treatments   Labs (all labs ordered are listed, but only abnormal results are displayed) Labs Reviewed  COMPREHENSIVE METABOLIC PANEL - Abnormal; Notable for the following components:      Result Value   Potassium 3.3 (*)    Total Protein 8.6 (*)    Total Bilirubin 1.4 (*)    All other components within normal limits  CBC - Abnormal; Notable for the following components:   WBC 12.0 (*)    All other components within normal limits  URINALYSIS, ROUTINE W REFLEX MICROSCOPIC - Abnormal; Notable for the following components:   APPearance HAZY (*)    Bilirubin Urine MODERATE (*)    Ketones, ur 80 (*)    Protein, ur 30 (*)    Leukocytes,Ua TRACE (*)    All other components within normal limits  URINALYSIS, MICROSCOPIC (REFLEX) - Abnormal; Notable for the following components:   Bacteria, UA MANY (*)    All other components within normal limits  LIPASE, BLOOD  PREGNANCY, URINE    EKG None  Radiology CT ABDOMEN PELVIS W CONTRAST  Result Date: 05/23/2023 CLINICAL DATA:  Abdominal pain, nausea/vomiting, prior gastric sleeve EXAM: CT ABDOMEN AND PELVIS WITH CONTRAST TECHNIQUE: Multidetector CT imaging of the abdomen and pelvis was performed using the standard protocol following bolus administration of intravenous contrast. RADIATION DOSE REDUCTION: This exam was performed according to the departmental dose-optimization program which includes automated exposure control, adjustment of the mA and/or kV according to patient size and/or use of iterative reconstruction technique. CONTRAST:  OMNIPAQUE IOHEXOL 300 MG/ML  SOLN COMPARISON:  03/08/2022 FINDINGS: Lower chest: Lung bases are clear. Hepatobiliary: Liver is within normal limits. Layering gallbladder sludge versus noncalcified gallstones (series 301/image 25), without associated phlegm a tori changes. No intrahepatic or extrahepatic duct dilatation. Pancreas: Within normal limits. Spleen: Within normal limits.  Adrenals/Urinary Tract: Adrenal glands are within normal limits. Kidneys are within normal limits.  No hydronephrosis. Bladder is within normal limits. Stomach/Bowel: Stomach is notable for postsurgical changes related to gastric sleeve. No evidence of bowel obstruction. Normal appendix (series 301/image 53). No colonic wall thickening or inflammatory changes. Vascular/Lymphatic: No evidence of abdominal aortic aneurysm. No suspicious abdominopelvic lymphadenopathy. Reproductive: Uterus is within normal limits. Bilateral ovaries are within normal limits. Other: No abdominopelvic ascites. Musculoskeletal: Visualized osseous structures are within normal limits. IMPRESSION: Postsurgical changes related to gastric sleeve. No evidence of bowel obstruction. Layering gallbladder sludge versus noncalcified gallstones, without associated inflammatory changes. Electronically Signed   By: Charline Bills M.D.   On: 05/23/2023 21:57    Procedures Procedures  None  Medications Ordered in ED Medications  ondansetron (ZOFRAN-ODT) disintegrating tablet 4 mg (4 mg Oral Given 05/23/23 1450)  morphine (PF) 4 MG/ML injection 4 mg (4 mg Intravenous Given 05/23/23 2015)  potassium chloride 10 mEq in 100 mL IVPB (10 mEq Intravenous New Bag/Given 05/23/23 2016)  lactated ringers bolus 1,000 mL (1,000  mLs Intravenous New Bag/Given 05/23/23 2016)  iohexol (OMNIPAQUE) 300 MG/ML solution 100 mL (100 mLs Intravenous Contrast Given 05/23/23 2026)    ED Course/ Medical Decision Making/ A&P                               Medical Decision Making Amount and/or Complexity of Data Reviewed Labs: ordered. Radiology: ordered.  Risk Prescription drug management.   This patient presents to the ED for concern of Emesis and abdominal pain, this involves an extensive number of treatment options, and is a complaint that carries with it a high risk of complications and morbidity.  The differential diagnosis includes hyperemesis  cannabinoid syndrome, obstruction, appendicitis, mesenteric ischemia, colitis.  Co morbidities that complicate the patient evaluation  As per HPI   Additional history obtained:  Additional history obtained from none External records from outside source obtained and reviewed including none   Lab Tests:  I Ordered, and personally interpreted labs.  The pertinent results include: No electrolyte abnormalities except for K 3.3, mildly elevated T bili 1.4, lipase normal, leukocytosis with WBC of 12.0, hemoglobin normal, platelet normal, UA showed positive for trace leukocytes, bilirubin and protein; many bacteria.  Pregnancy test negative  Imaging Studies ordered:  I ordered imaging studies including abdomen pelvis with contrast I independently visualized and interpreted imaging which showed gallbladder sludge and postsurgical changes I agree with the radiologist interpretation  IMPRESSION: Postsurgical changes related to gastric sleeve. No evidence of bowel obstruction.   Layering gallbladder sludge versus noncalcified gallstones, without associated inflammatory changes.   Cardiac Monitoring: / EKG:  The patient was maintained on a cardiac monitor.  I personally viewed and interpreted the cardiac monitored which showed an underlying rhythm of: Sinus rhythm   Consultations Obtained:  None   Problem List / ED Course / Critical interventions / Medication management  Emesis, Biliary cholic  I ordered medication including Zofran 4 mg, potassium chloride, morphine 4 mg IV, 1 L LR for pain.    Reevaluation of the patient after these medicines showed that the patient improved I have reviewed the patients home medicines and have made adjustments as needed   Social Determinants of Health:  Uses marijuana recreationally    Test / Admission - Considered:  This 32 year old female past medical history of gastric bypass, she was seen at least 8 9 times last year in ED for similar  symptoms of nausea and vomiting, finally diagnosed with cannabinoid hyperemesis syndrome.  She presents today for nausea and vomiting that started about 4 days ago, nonbloody, nonbilious.  She was unable to keep any solids or liquids down.  She also endorsed lower abdominal pain radiating to her sides bilaterally, reported last bowel movement about 4 days ago.  Denies any dysuria, hematuria, vaginal discharge, chest pain, shortness of breath, cough congestion sore throat headache.  She reports that she smokes marijuana occasionally, last medical last week Wednesday.  Physical exam notable for tenderness to palpation of the lower abdomen.  Labs show potassium of 3.3, mildly increased total bilirubin 1.4, WBC of 12.0, otherwise labs reassuring.  UA showed trace leukocytes, protein, many bacteria, budding yeast is present.  CT abdomen pelvis showed gallbladder sludge versus noncalcified gallstones without associated inflammatory changes.  Lipase normal, unlikely pancreatitis.  Imaging finding shows likely biliary colic due to either gallbladder sludge or noncalcified gallstones.  Upon reevaluation, patient reported that her symptoms improved.  Patient is advised to follow-up with general  surgery for biliary colic. Plan to send patient with short course of pain medication, zofran, and diflucan for yeast infection.   Final Clinical Impression(s) / ED Diagnoses Final diagnoses:  None    Rx / DC Orders ED Discharge Orders     None         Jeral Pinch, DO 05/23/23 2254    Tegeler, Canary Brim, MD 05/24/23 [phone]

## 2023-05-23 NOTE — ED Notes (Signed)
Before pt left, pt called this RN and reported that the second dose of morphine did not help and asked if she had received it. Informed pt that this RN gave 4mg  IV push prior to removal of IV for d/c.

## 2023-05-23 NOTE — ED Triage Notes (Signed)
Pt states vomiting x 4 days, denies diarrhea  Reports generalized abd pain  Denies fever  Not tolerating po intake

## 2023-08-17 IMAGING — CT CT RENAL STONE PROTOCOL
2 of 4 series · 16 of 46 positions shown, 18 images · non-contrast
Comparison: 08/06/2021

CLINICAL DATA: Bilateral flank pain



[Series 2: axial st · axial · 0.86mm/px · z∈[-515,-65]mm · 13 of 102 slices shown, 15 images]
[im 6/102  soft-tissue]
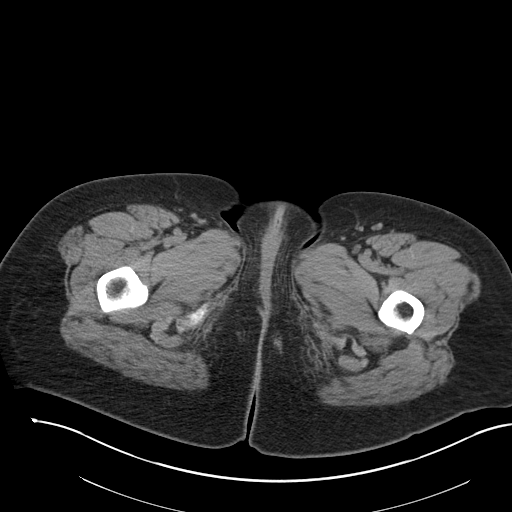
[im 6/102  bone]
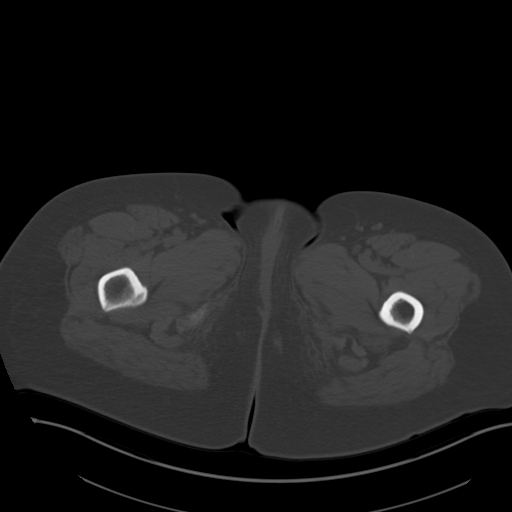
[im 12/102  soft-tissue]
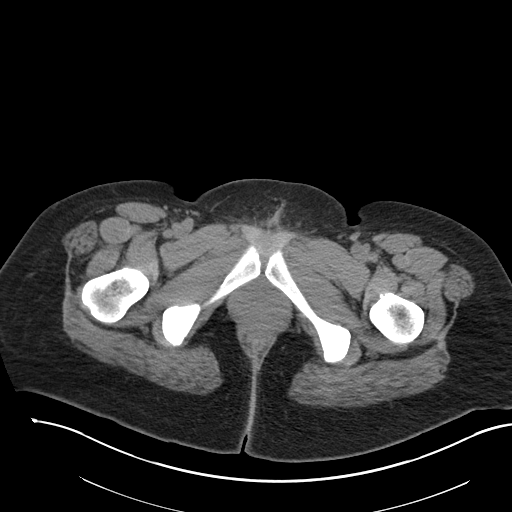
[im 23/102  soft-tissue]
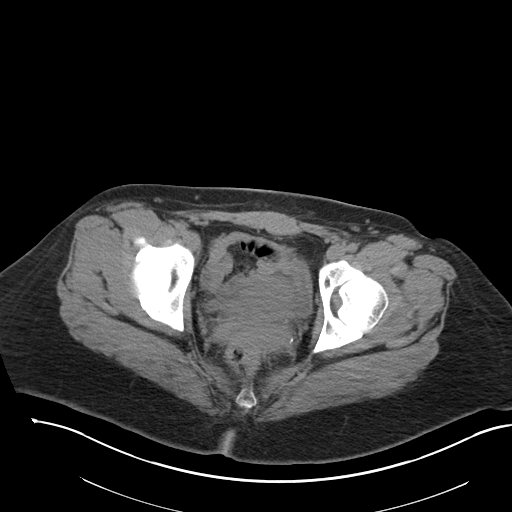
[im 29/102  soft-tissue]
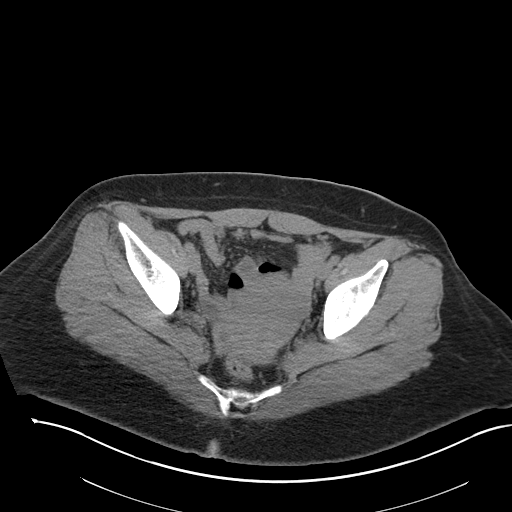
[im 34/102  soft-tissue]
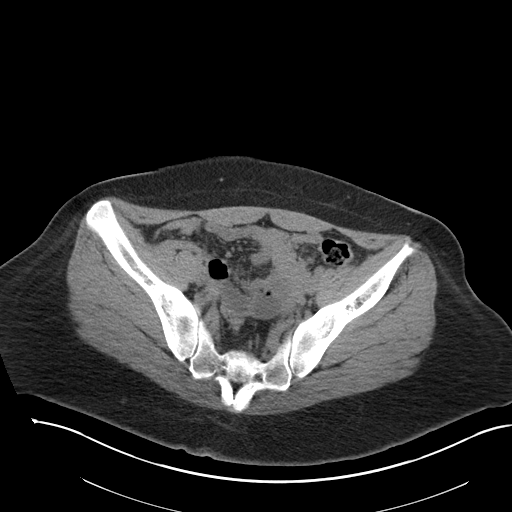
[im 45/102  soft-tissue]
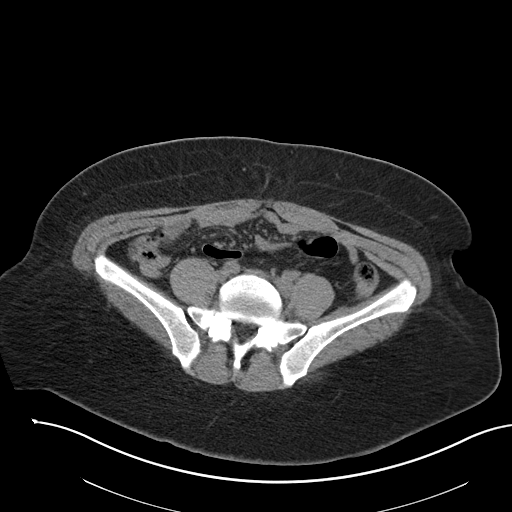
[im 51/102  soft-tissue]
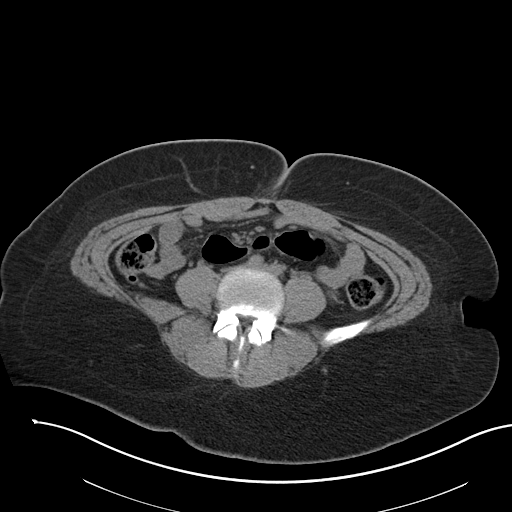
[im 57/102  soft-tissue]
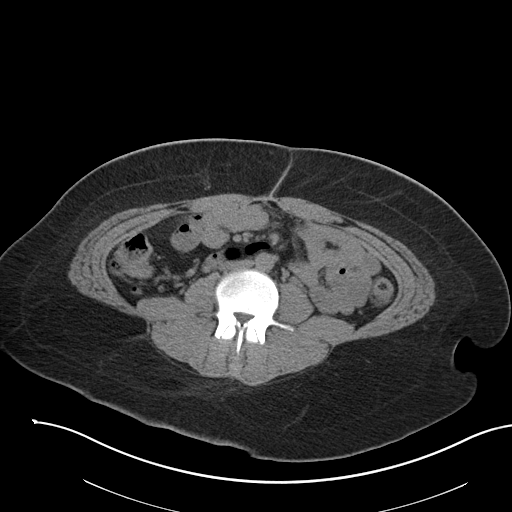
[im 68/102  soft-tissue]
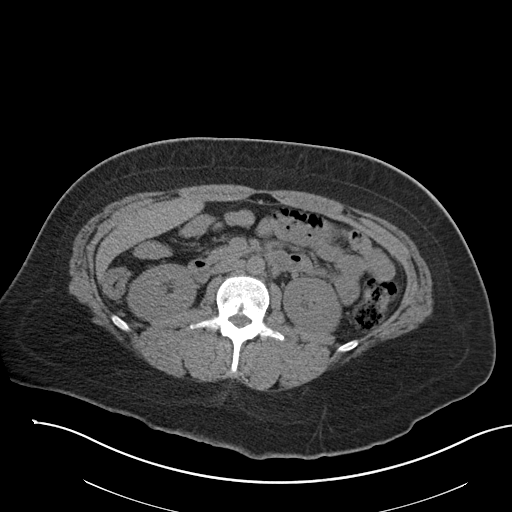
[im 68/102  bone]
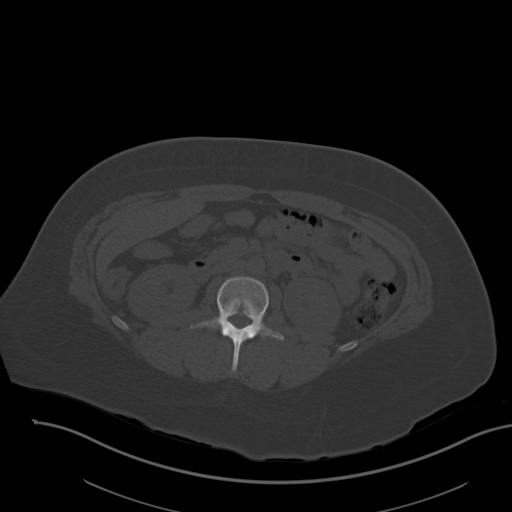
[im 73/102  soft-tissue]
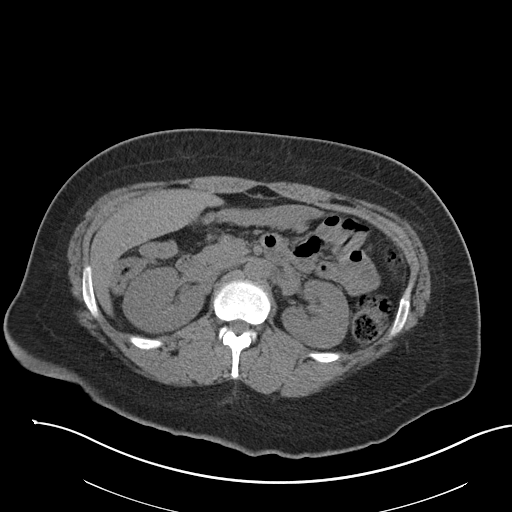
[im 79/102  soft-tissue]
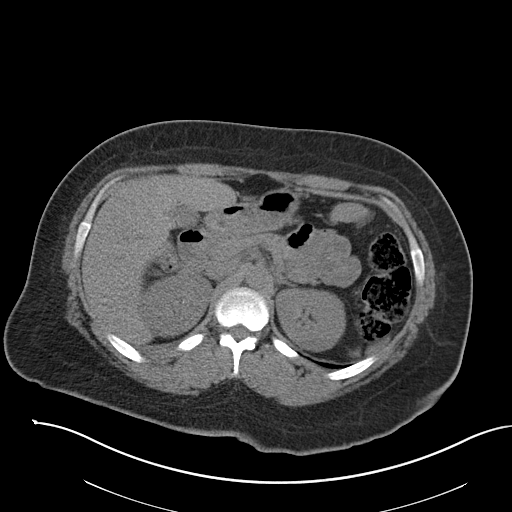
[im 90/102  soft-tissue]
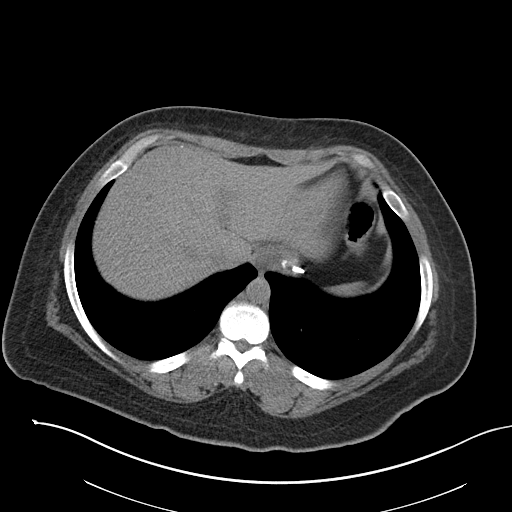
[im 96/102  soft-tissue]
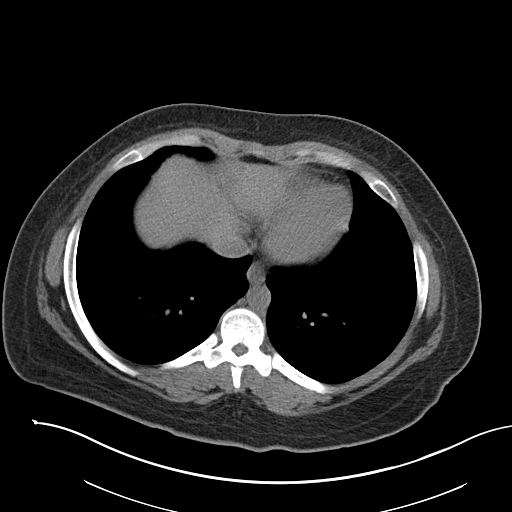

[Series 4: coronal · coronal · 0.87mm/px · 3 of 127 slices shown]
[im 43/127  soft-tissue]
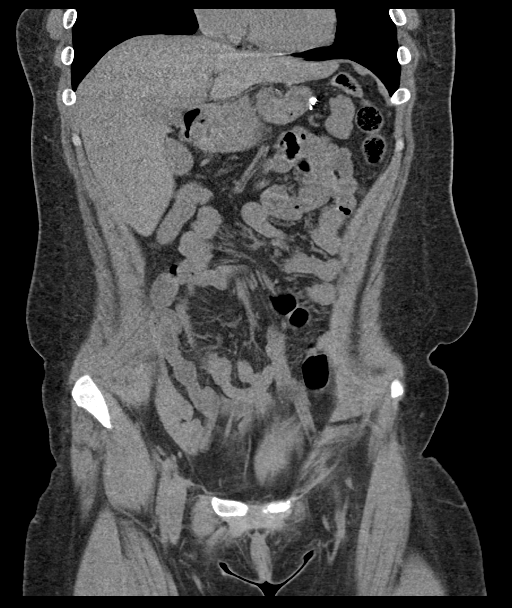
[im 57/127  soft-tissue]
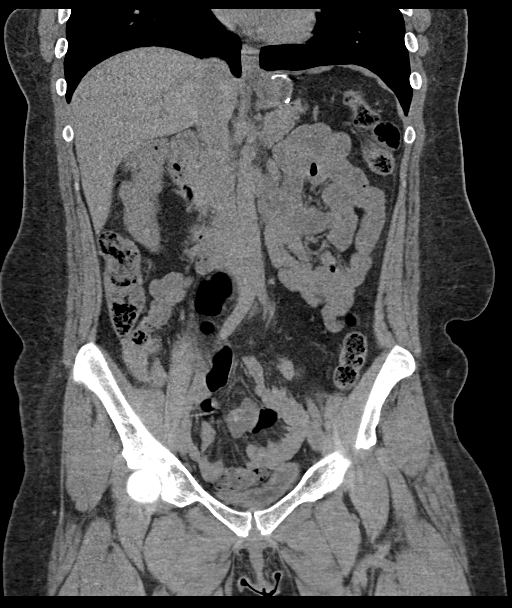
[im 71/127  soft-tissue]
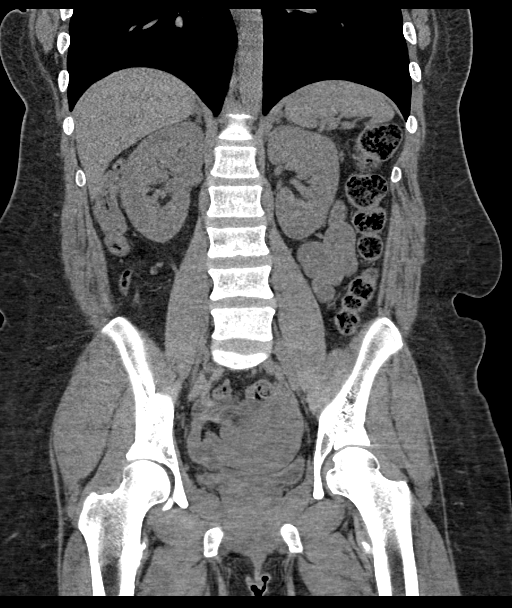

[16 of 46 positions shown; findings below may reference images not displayed]

FINDINGS: Lower chest: No acute abnormality.

Hepatobiliary: No focal hepatic abnormality. Gallbladder
unremarkable.

Pancreas: No focal abnormality or ductal dilatation.

Spleen: No focal abnormality.  Normal size.

Adrenals/Urinary Tract: Punctate nonobstructing stones in the upper
pole of the right kidney, 1 mm. No ureteral stones or
hydronephrosis. Adrenal glands and urinary bladder unremarkable.

Stomach/Bowel: Normal appendix. Postoperative changes in the
stomach. Stomach, large and small bowel grossly unremarkable.

Vascular/Lymphatic: No evidence of aneurysm or adenopathy.

Reproductive: Uterus and adnexa unremarkable.  No mass.

Other: No free fluid or free air.

Musculoskeletal: No acute bony abnormality.
IMPRESSION: Punctate 1 mm nonobstructing right upper pole renal stones. No
ureteral stones or hydronephrosis.

No acute findings in the abdomen or pelvis.

## 2023-12-26 ENCOUNTER — Ambulatory Visit
Admission: RE | Admit: 2023-12-26 | Discharge: 2023-12-26 | Disposition: A | Discharge status: Home / Self Care | Source: Ambulatory Visit | Attending: Family Medicine | Admitting: Family Medicine

## 2023-12-26 ENCOUNTER — Telehealth: Payer: Self-pay

## 2023-12-26 ENCOUNTER — Other Ambulatory Visit: Payer: Self-pay

## 2023-12-26 VITALS — BP 104/68 | HR 74 | Temp 98.4°F | Resp 17

## 2023-12-26 DIAGNOSIS — M25512 Pain in left shoulder: Secondary | ICD-10-CM

## 2023-12-26 MED ORDER — CYCLOBENZAPRINE HCL 10 MG PO TABS: 10.0000 mg | ORAL_TABLET | Freq: Two times a day (BID) | ORAL | 0 refills | Status: AC | PRN

## 2023-12-26 MED ORDER — KETOROLAC TROMETHAMINE 30 MG/ML IJ SOLN
30.0000 mg | Freq: Once | INTRAMUSCULAR | Status: AC
  Administered 2023-12-26: 30 mg via INTRAMUSCULAR

## 2023-12-26 NOTE — Discharge Instructions (Addendum)
 You were given a Toradol  injection in clinic today. Do not take any over the counter NSAID's such as Advil , ibuprofen , Aleve , or naproxen  for 24 hours. You may take tylenol  if needed.  You may take Flexeril  twice daily as needed.  Please note this medication will make you drowsy.  Do not drink alcohol or drive while on this medication.  Use heat to the shoulder/arm.  Lots of rest.  Please follow-up with your PCP if your symptoms do not improve.  Please go to the ER for any worsening symptoms.  Hope you better soon!

## 2023-12-26 NOTE — Telephone Encounter (Signed)
 TC from patient requesting for Flexeril  to be sent to [facility]

## 2023-12-26 NOTE — ED Provider Notes (Signed)
 [facility]    CSN: [id number] Arrival date & time: 12/26/23  1026      History   Chief Complaint No chief complaint on file.   HPI Tammy Andrews is a 33 y.o. female presents for shoulder pain.  Patient reports 1 month of a persistent left shoulder pain begins on her left upper arm and radiates into her shoulder and left side of her neck.  She does endorse occasional paresthesias but no weakness.  Denies midline neck pain, chest pain.  No known injury or inciting event.  No history of injuries or surgeries to the affected area in the past.  She is taken Tylenol  and some topical numbing cream with temporary improvement.  Denies pregnancy or breast-feeding.  No other concerns at this time.  HPI  Past Medical History:  Diagnosis Date   Anemia    Anxiety    Asthma    childhood   Depression    Hyperemesis gravidarum before end of [redacted] week gestation with dehydration 07/27/2018   16% wt loss by 19 weeks.   Hypertension    PIH   Pregnancy affected by fetal growth restriction 01/14/2019   Preterm labor     Patient Active Problem List   Diagnosis Date Noted   Presence of IUD 06/16/2019   Chronic hypertension 02/13/2019   Fetal demise in singleton pregnancy greater than [redacted] weeks gestation, antepartum 09/13/2018   Marijuana abuse 08/09/2018   H/O bariatric surgery 08/09/2018    Past Surgical History:  Procedure Laterality Date   DILATION AND CURETTAGE OF UTERUS     IUD REMOVAL N/A 06/20/2019   Procedure: INTRAUTERINE DEVICE (IUD) REMOVAL;  Surgeon: Ana Balling, MD;  Location: [facility];  Service: Gynecology;  Laterality: N/A;   LAPAROSCOPIC GASTRIC SLEEVE RESECTION  08/2014    OB History     Gravida  3   Para  2   Term  0   Preterm  2   AB  1   Living  1      SAB  0   IAB  1   Ectopic  0   Multiple  0   Live Births  1        Obstetric Comments  G2: per pt, approx 24wks, woke with bleeding (no pain) and dx with IUFD when went  to hospital. IOL SVD          Home Medications    Prior to Admission medications   Medication Sig Start Date End Date Taking? Authorizing Provider  cephALEXin  (KEFLEX ) 500 MG capsule Take 1 capsule (500 mg total) by mouth 3 (three) times daily. 02/15/21   Haviland, Julie, MD  ciprofloxacin  (CIPRO ) 500 MG tablet Take 1 tablet (500 mg total) by mouth every 12 (twelve) hours. 08/06/21   Prosperi, Christian H, PA-C  ibuprofen  (ADVIL ) 600 MG tablet Take 1 tablet (600 mg total) by mouth every 6 (six) hours as needed. 02/15/21   Haviland, Julie, MD  metoCLOPramide  (REGLAN ) 10 MG tablet Take 1 tablet (10 mg total) by mouth every 6 (six) hours. 02/17/21   Henderly, Britni A, PA-C  metoCLOPramide  (REGLAN ) 10 MG tablet Take 1 tablet (10 mg total) by mouth every 6 (six) hours. 08/20/21   Venter, Margaux, PA-C  promethazine  (PHENERGAN ) 25 MG suppository Place 1 suppository (25 mg total) rectally every 6 (six) hours as needed for nausea or vomiting. 08/20/21   Margarete Sharps, Margaux, PA-C  promethazine  (PHENERGAN ) 25 MG tablet Take 1 tablet (25 mg  total) by mouth every 8 (eight) hours as needed for nausea or vomiting. 02/15/21   Haviland, Julie, MD  promethazine  (PHENERGAN ) 25 MG tablet Take 1 tablet (25 mg total) by mouth every 6 (six) hours as needed for nausea or vomiting. 08/02/21   Coretta Dexter, PA  promethazine  (PHENERGAN ) 25 MG tablet Take 1 tablet (25 mg total) by mouth every 6 (six) hours as needed for nausea or vomiting. 08/25/21   Conklin, Erica R, PA-C  haloperidol  (HALDOL ) 5 MG tablet Take 1 tablet (5 mg total) by mouth every 6 (six) hours as needed (nausea or vomiting). 06/17/19 06/18/19  Alissa April, MD    Family History Family History  Problem Relation Age of Onset   Hypertension Father    Diabetes Paternal Grandmother     Social History Social History   Tobacco Use   Smoking status: Never   Smokeless tobacco: Never  Vaping Use   Vaping status: Never Used  Substance Use Topics   Alcohol use:  Not Currently    Comment: occ   Drug use: Yes    Types: Marijuana    Comment: last use 07-31-21     Allergies   Haldol  [haloperidol  lactate] and Pork-derived products   Review of Systems Review of Systems  Musculoskeletal:        Left shoulder pain     Physical Exam Triage Vital Signs ED Triage Vitals  Encounter Vitals Group     BP 12/26/23 1041 104/68     Systolic BP Percentile --      Diastolic BP Percentile --      Pulse Rate 12/26/23 1041 74     Resp 12/26/23 1041 17     Temp 12/26/23 1041 98.4 F (36.9 C)     Temp Source 12/26/23 1041 Oral     SpO2 12/26/23 1041 98 %     Weight --      Height --      Head Circumference --      Peak Flow --      Pain Score 12/26/23 1039 7     Pain Loc --      Pain Education --      Exclude from Growth Chart --    No data found.  Updated Vital Signs BP 104/68   Pulse 74   Temp 98.4 F (36.9 C) (Oral)   Resp 17   LMP 11/29/2023   SpO2 98%   Visual Acuity Right Eye Distance:   Left Eye Distance:   Bilateral Distance:    Right Eye Near:   Left Eye Near:    Bilateral Near:     Physical Exam Vitals and nursing note reviewed.  Constitutional:      General: She is not in acute distress.    Appearance: Normal appearance. She is not ill-appearing.  HENT:     Head: Normocephalic and atraumatic.  Eyes:     Pupils: Pupils are equal, round, and reactive to light.  Cardiovascular:     Rate and Rhythm: Normal rate.  Pulmonary:     Effort: Pulmonary effort is normal.  Musculoskeletal:     Left shoulder: Tenderness present. No swelling, deformity, effusion, laceration, bony tenderness or crepitus. Normal range of motion. Normal strength. Normal pulse.       Arms:     Cervical back: Normal range of motion and neck supple. No edema, erythema, signs of trauma, rigidity, torticollis or crepitus. Muscular tenderness present. No spinous process tenderness. Normal range of motion.  Comments: There is no swelling,  ecchymosis, erythema of the left neck shoulder or upper arm.  It is tender to palpation from the left paracervical muscles that extends to left trapezius and down to left deltoid.  There is pain to the shoulder with extension of the neck and rotation to the right.  Positive Gladys Lamp test.  Strength is 5 out of 5 bilateral upper extremities.  Skin:    General: Skin is warm and dry.  Neurological:     General: No focal deficit present.     Mental Status: She is alert and oriented to person, place, and time.  Psychiatric:        Mood and Affect: Mood normal.        Behavior: Behavior normal.      UC Treatments / Results  Labs (all labs ordered are listed, but only abnormal results are displayed) Labs Reviewed - No data to display Comprehensive metabolic panel Order: [id number]  Status: Final result     Next appt: None   Test Result Released: Yes (not seen)   0 Result Notes          Component Ref Range & Units (hover) 7 mo ago (05/23/23) 1 yr ago (03/08/22) 2 yr ago (08/25/21) 2 yr ago (08/19/21) 2 yr ago (08/06/21) 2 yr ago (08/03/21) 2 yr ago (08/03/21)  Sodium 139 138 138 140 138 136 141  Potassium 3.3 Low  3.8 3.2 Low  3.8 3.1 Low  4.0 3.6  Comment: HEMOLYSIS AT THIS LEVEL MAY AFFECT RESULT  Chloride 101 104 102 104 104 108 110  CO2 23 22 27 25 25 21  Low  23  Glucose, Bld 93 114 High  CM 108 High  CM 108 High  CM 103 High  CM 111 High  CM 111 High  CM  Comment: Glucose reference range applies only to samples taken after fasting for at least 8 hours.  BUN 18 18 14 11 16 16 13   Creatinine, Ser 0.93 0.98 0.89 0.95 0.86 0.87 0.93  Calcium  9.3 9.8 9.3 9.7 9.1 9.0 9.1  Total Protein 8.6 High  8.8 High  7.7 8.6 High  8.1 7.9 7.6  Albumin 4.3 4.5 4.1 4.6 4.1 4.0 3.9  AST 27 30 23 23 19 26 21   Comment: HEMOLYSIS AT THIS LEVEL MAY AFFECT RESULT  ALT 19 23 21 20 17 20 18   Comment: HEMOLYSIS AT THIS LEVEL MAY AFFECT RESULT  Alkaline Phosphatase 51 61 38 47 42 47 48  Total  Bilirubin 1.4 High  0.8 R 0.4 R 1.0 R 0.5 R 1.0 R 0.7 R  Comment: HEMOLYSIS AT THIS LEVEL MAY AFFECT RESULT  GFR, Estimated >60 >60 CM >60 CM >60 CM >60 CM >60 CM >60 CM  Comment: (NOTE) Calculated using the CKD-EPI Creatinine Equation (2021)  Anion gap 15 12 CM 9 CM 11 CM 9 CM 7 CM 8 CM  Comment: Performed at [facility], [address] [place], [place] [address]  Resulting Agency [facility]        Specimen Collected: 05/23/23 14:42 Last Resulted: 05/23/23 15:35       EKG   Radiology No results found.  Procedures Procedures (including critical care time)  Medications Ordered in UC Medications  ketorolac  (TORADOL ) 30 MG/ML injection 30 mg (30 mg Intramuscular Given 12/26/23 1104)    Initial Impression /  Assessment and Plan / UC Course  I have reviewed the triage vital signs and the nursing notes.  Pertinent labs & imaging results that were available during my care of the patient were reviewed by me and considered in my medical decision making (see chart for details).     Reviewed exam and symptoms with patient.  No red flags.  Discussed likely musculoskeletal cause of pain.  Has no injury will defer x-ray at this time.  Patient was given Toradol  injection in clinic.  Monitored for 10 minutes after injection with no reaction noted and tolerated well.  Was instructed no NSAIDs for 24 hours and verbalized understanding.  Start Flexeril , side effect profile reviewed.  Advised heat and PCP follow-up if symptoms do not improve.  ER precautions reviewed. Final Clinical Impressions(s) / UC Diagnoses   Final diagnoses:  Acute pain of left shoulder     Discharge Instructions      You were given a Toradol  injection in clinic today. Do not take any over the counter NSAID's such as Advil , ibuprofen , Aleve , or naproxen  for 24 hours. You may take tylenol  if needed.  You may take Flexeril  twice daily  as needed.  Please note this medication will make you drowsy.  Do not drink alcohol or drive while on this medication.  Use heat to the shoulder/arm.  Lots of rest.  Please follow-up with your PCP if your symptoms do not improve.  Please go to the ER for any worsening symptoms.  Hope you better soon!    ED Prescriptions   None    PDMP not reviewed this encounter.   Alleen Arbour, NP 12/26/23 1121

## 2023-12-26 NOTE — ED Triage Notes (Signed)
 Pt c/o left shoulder painx107mo. Pt denies injury. Pt has full ROM of left shoulder. Pt states her left fingers have int numbnessx2-3wks. Pt has 2+ left radial pulse, cap refill less than 3 sec, warm to touch

## 2024-01-19 ENCOUNTER — Emergency Department (HOSPITAL_BASED_OUTPATIENT_CLINIC_OR_DEPARTMENT_OTHER)
Admission: EM | Admit: 2024-01-19 | Discharge: 2024-01-19 | Disposition: A | Discharge status: Home / Self Care | Attending: Emergency Medicine | Admitting: Emergency Medicine

## 2024-01-19 ENCOUNTER — Encounter (HOSPITAL_BASED_OUTPATIENT_CLINIC_OR_DEPARTMENT_OTHER): Payer: Self-pay | Admitting: Emergency Medicine

## 2024-01-19 ENCOUNTER — Emergency Department (HOSPITAL_BASED_OUTPATIENT_CLINIC_OR_DEPARTMENT_OTHER)

## 2024-01-19 ENCOUNTER — Other Ambulatory Visit: Payer: Self-pay

## 2024-01-19 DIAGNOSIS — M5412 Radiculopathy, cervical region: Secondary | ICD-10-CM

## 2024-01-19 DIAGNOSIS — M25512 Pain in left shoulder: Secondary | ICD-10-CM | POA: Insufficient documentation

## 2024-01-19 DIAGNOSIS — M542 Cervicalgia: Secondary | ICD-10-CM | POA: Diagnosis not present

## 2024-01-19 DIAGNOSIS — M79602 Pain in left arm: Secondary | ICD-10-CM | POA: Diagnosis not present

## 2024-01-19 MED ORDER — LIDOCAINE 5 % EX PTCH: 1.0000 | MEDICATED_PATCH | CUTANEOUS | 0 refills | Status: AC

## 2024-01-19 MED ORDER — METHOCARBAMOL 500 MG PO TABS
500.0000 mg | ORAL_TABLET | Freq: Once | ORAL | Status: AC
  Administered 2024-01-19: 500 mg via ORAL
  Filled 2024-01-19: qty 1

## 2024-01-19 MED ORDER — METHYLPREDNISOLONE 4 MG PO TBPK: ORAL_TABLET | ORAL | 0 refills | Status: AC

## 2024-01-19 MED ORDER — METHOCARBAMOL 500 MG PO TABS: 500.0000 mg | ORAL_TABLET | Freq: Three times a day (TID) | ORAL | 0 refills | Status: AC | PRN

## 2024-01-19 MED ORDER — KETOROLAC TROMETHAMINE 30 MG/ML IJ SOLN
30.0000 mg | Freq: Once | INTRAMUSCULAR | Status: AC
  Administered 2024-01-19: 30 mg via INTRAMUSCULAR
  Filled 2024-01-19: qty 1

## 2024-01-19 NOTE — ED Triage Notes (Signed)
 Pt reports left shoulder pain x 1 week. Denies injury. Reports going to UC & was given toradol  & script for muscle relaxer's at home. States pain is the same.

## 2024-01-19 NOTE — Discharge Instructions (Addendum)
 take the steroids and muscle relaxers as prescribed.  You may take Robaxin  or Flexeril  but not both.  Follow-up with your primary doctor.  You may require an MRI if your symptoms do not improve.  Return to the ED (at [facility] where MRI can be done) with worsening pain, weakness, numbness, tingling, bowel or bladder incontinence or any other concerns.

## 2024-01-19 NOTE — ED Provider Notes (Signed)
 [facility] Provider Note   CSN: [id number] Arrival date & time: 01/19/24  0344     Patient presents with: Shoulder Pain   Tammy Andrews is a 33 y.o. female.   Several weeks of left shoulder and arm pain.  Denies fall or injury.  She initially said 2 weeks ago was seen at urgent care in June 10 for similar symptoms where she said pain started 1 month ago.  Has constant pain to her trapezius area, shoulder that radiates down her left arm.  Associate with intermittent numbness to her left arm at times.  She was given Toradol  and Flexeril  by urgent care but this is not helping.  Reports pain is constant but waxes and wanes in severity.  Denies any significant neck pain.  No chest pain or shortness of breath.  No abdominal pain, cough, fever, runny nose or sore throat.  She is left-handed but denies any known injury.  No chest pain.  Comes in tonight because the pain is severe and she cannot sleep.  The history is provided by the patient.  Shoulder Pain Associated symptoms: neck pain   Associated symptoms: no fever        Prior to Admission medications   Medication Sig Start Date End Date Taking? Authorizing Provider  cephALEXin  (KEFLEX ) 500 MG capsule Take 1 capsule (500 mg total) by mouth 3 (three) times daily. 02/15/21   Dean Clarity, MD  ciprofloxacin  (CIPRO ) 500 MG tablet Take 1 tablet (500 mg total) by mouth every 12 (twelve) hours. 08/06/21   Prosperi, Christian H, PA-C  cyclobenzaprine  (FLEXERIL ) 10 MG tablet Take 1 tablet (10 mg total) by mouth 2 (two) times daily as needed for muscle spasms. 12/26/23   Mayer, Jodi R, NP  ibuprofen  (ADVIL ) 600 MG tablet Take 1 tablet (600 mg total) by mouth every 6 (six) hours as needed. 02/15/21   Haviland, Julie, MD  metoCLOPramide  (REGLAN ) 10 MG tablet Take 1 tablet (10 mg total) by mouth every 6 (six) hours. 02/17/21   Henderly, Britni A, PA-C  metoCLOPramide  (REGLAN ) 10 MG tablet Take 1 tablet (10 mg  total) by mouth every 6 (six) hours. 08/20/21   Venter, Margaux, PA-C  promethazine  (PHENERGAN ) 25 MG suppository Place 1 suppository (25 mg total) rectally every 6 (six) hours as needed for nausea or vomiting. 08/20/21   Shepard, Margaux, PA-C  promethazine  (PHENERGAN ) 25 MG tablet Take 1 tablet (25 mg total) by mouth every 8 (eight) hours as needed for nausea or vomiting. 02/15/21   Haviland, Julie, MD  promethazine  (PHENERGAN ) 25 MG tablet Take 1 tablet (25 mg total) by mouth every 6 (six) hours as needed for nausea or vomiting. 08/02/21   Neldon Hamp RAMAN, PA  promethazine  (PHENERGAN ) 25 MG tablet Take 1 tablet (25 mg total) by mouth every 6 (six) hours as needed for nausea or vomiting. 08/25/21   Conklin, Erica R, PA-C  haloperidol  (HALDOL ) 5 MG tablet Take 1 tablet (5 mg total) by mouth every 6 (six) hours as needed (nausea or vomiting). 06/17/19 06/18/19  Raford Lenis, MD    Allergies: Haldol  [haloperidol  lactate] and Pork-derived products    Review of Systems  Constitutional:  Negative for activity change, appetite change and fever.  HENT:  Negative for congestion and rhinorrhea.   Respiratory:  Negative for cough, chest tightness and shortness of breath.   Cardiovascular:  Negative for chest pain.  Gastrointestinal:  Negative for abdominal pain, nausea and vomiting.  Genitourinary:  Negative for dysuria and hematuria.  Musculoskeletal:  Positive for arthralgias, myalgias and neck pain.  Skin:  Negative for rash.  Neurological:  Negative for dizziness, weakness and headaches.   all other systems are negative except as noted in the HPI and PMH.    Updated Vital Signs BP (!) 125/92   Pulse 78   Temp 98 F (36.7 C) (Oral)   Resp 16   LMP 11/29/2023   SpO2 99%   Physical Exam Vitals and nursing note reviewed.  Constitutional:      General: She is not in acute distress.    Appearance: She is well-developed.  HENT:     Head: Normocephalic and atraumatic.     Mouth/Throat:     Pharynx:  No oropharyngeal exudate.  Eyes:     Conjunctiva/sclera: Conjunctivae normal.     Pupils: Pupils are equal, round, and reactive to light.  Neck:     Comments: No meningismus. Cardiovascular:     Rate and Rhythm: Normal rate and regular rhythm.     Heart sounds: Normal heart sounds. No murmur heard. Pulmonary:     Effort: Pulmonary effort is normal. No respiratory distress.     Breath sounds: Normal breath sounds.  Abdominal:     Palpations: Abdomen is soft.     Tenderness: There is no abdominal tenderness. There is no guarding or rebound.  Musculoskeletal:        General: Tenderness present.     Cervical back: Normal range of motion and neck supple.     Comments: Tender left trapezius, paracervical musculature.  No midline tenderness.  Pain with range of motion of left shoulder but able to raise above head and behind neck. Good grip strength.  Equal radial pulses bilaterally.  Forearm flexion extension intact.  Skin:    General: Skin is warm.  Neurological:     Mental Status: She is alert and oriented to person, place, and time.     Cranial Nerves: No cranial nerve deficit.     Motor: No abnormal muscle tone.     Coordination: Coordination normal.     Comments:  5/5 strength throughout. CN 2-12 intact.Equal grip strength.   Psychiatric:        Behavior: Behavior normal.     (all labs ordered are listed, but only abnormal results are displayed) Labs Reviewed - No data to display  EKG: None  Radiology: DG Cervical Spine Complete Result Date: 01/19/2024 CLINICAL DATA:  Pain. EXAM: CERVICAL SPINE - COMPLETE 4+ VIEW COMPARISON:  None Available. FINDINGS: Mild reversal of normal cervical lordosis. There is no evidence of cervical spine fracture or prevertebral soft tissue swelling. Alignment is normal. No other significant bone abnormalities are identified. IMPRESSION: Mild reversal of normal cervical lordosis. No acute bony findings. Electronically Signed   By: Camellia Candle M.D.    On: 01/19/2024 06:27   DG Shoulder Left Result Date: 01/19/2024 CLINICAL DATA:  Pain. EXAM: LEFT SHOULDER - 2+ VIEW COMPARISON:  None Available. FINDINGS: There is no evidence of fracture or dislocation. There is no evidence of arthropathy or other focal bone abnormality. Soft tissues are unremarkable. IMPRESSION: Negative. Electronically Signed   By: Camellia Candle M.D.   On: 01/19/2024 06:26     Procedures   Medications Ordered in the ED  ketorolac  (TORADOL ) 30 MG/ML injection 30 mg (has no administration in time range)  methocarbamol  (ROBAXIN ) tablet 500 mg (has no administration in time range)  Medical Decision Making Amount and/or Complexity of Data Reviewed Labs: ordered. Decision-making details documented in ED Course. Radiology: ordered and independent interpretation performed. Decision-making details documented in ED Course. ECG/medicine tests: ordered and independent interpretation performed. Decision-making details documented in ED Course.  Risk Prescription drug management.   Several Months of left arm pain, no injury.  Neurovascularly intact.  Suspect cervical radiculopathy.  Will obtain x-rays as they have not been done previously.  No weakness.  Intermittent numbness at times.  Equal grip strength currently.  No indication for emergent MRI  X-rays negative for fracture or dislocation.  No obvious foraminal stenosis.  Results reviewed and interpreted by me.  Will give course of steroids and muscle relaxers.  Change Flexeril  to Robaxin .  Advise she can take Flexeril  or Robaxin  but not both.  Follow-up with PCP.  Referral to spine surgery given.  May require MRI of cervical spine if symptoms do not improve.  Return to the ED sooner if worsening pain, weakness, numbness, tingling, bowel or bladder incontinence or any concerns.    Final diagnoses:  None    ED Discharge Orders     None          Jamyia Fortune, Garnette, MD 01/19/24  [phone]

## 2024-03-31 ENCOUNTER — Emergency Department (HOSPITAL_BASED_OUTPATIENT_CLINIC_OR_DEPARTMENT_OTHER)

## 2024-03-31 ENCOUNTER — Encounter (HOSPITAL_BASED_OUTPATIENT_CLINIC_OR_DEPARTMENT_OTHER): Payer: Self-pay | Admitting: Urology

## 2024-03-31 ENCOUNTER — Other Ambulatory Visit: Payer: Self-pay

## 2024-03-31 ENCOUNTER — Emergency Department (HOSPITAL_BASED_OUTPATIENT_CLINIC_OR_DEPARTMENT_OTHER)
Admission: EM | Admit: 2024-03-31 | Discharge: 2024-03-31 | Disposition: A | Discharge status: Home / Self Care | Attending: Emergency Medicine | Admitting: Emergency Medicine

## 2024-03-31 DIAGNOSIS — S29019A Strain of muscle and tendon of unspecified wall of thorax, initial encounter: Secondary | ICD-10-CM | POA: Diagnosis not present

## 2024-03-31 DIAGNOSIS — X58XXXA Exposure to other specified factors, initial encounter: Secondary | ICD-10-CM | POA: Insufficient documentation

## 2024-03-31 DIAGNOSIS — R1115 Cyclical vomiting syndrome unrelated to migraine: Secondary | ICD-10-CM | POA: Diagnosis not present

## 2024-03-31 DIAGNOSIS — R079 Chest pain, unspecified: Secondary | ICD-10-CM | POA: Insufficient documentation

## 2024-03-31 DIAGNOSIS — M545 Low back pain, unspecified: Secondary | ICD-10-CM | POA: Diagnosis present

## 2024-03-31 LAB — COMPREHENSIVE METABOLIC PANEL WITH GFR
ALT: 16 U/L (ref 0–44)
AST: 23 U/L (ref 15–41)
Albumin: 4.5 g/dL (ref 3.5–5.0)
Alkaline Phosphatase: 57 U/L (ref 38–126)
Anion gap: 12 (ref 5–15)
BUN: 11 mg/dL (ref 6–20)
CO2: 23 mmol/L (ref 22–32)
Calcium: 9.6 mg/dL (ref 8.9–10.3)
Chloride: 104 mmol/L (ref 98–111)
Creatinine, Ser: 0.81 mg/dL (ref 0.44–1.00)
GFR, Estimated: >60 mL/min (ref >60)
Glucose, Bld: 170 mg/dL — ABNORMAL HIGH (ref 70–99)
Potassium: 4 mmol/L (ref 3.5–5.1)
Sodium: 140 mmol/L (ref 135–145)
Total Bilirubin: 0.3 mg/dL (ref 0.0–1.2)
Total Protein: 7.6 g/dL (ref 6.5–8.1)

## 2024-03-31 LAB — URINALYSIS, ROUTINE W REFLEX MICROSCOPIC
Bilirubin Urine: NEGATIVE
Glucose, UA: NEGATIVE mg/dL
Hgb urine dipstick: NEGATIVE
Ketones, ur: NEGATIVE mg/dL
Leukocytes,Ua: NEGATIVE
Nitrite: NEGATIVE
Protein, ur: NEGATIVE mg/dL
Specific Gravity, Urine: 1.02 (ref 1.005–1.030)
pH: 8.5 — ABNORMAL HIGH (ref 5.0–8.0)

## 2024-03-31 LAB — CBC
HCT: 37.2 % (ref 36.0–46.0)
Hemoglobin: 12.8 g/dL (ref 12.0–15.0)
MCH: 31.7 pg (ref 26.0–34.0)
MCHC: 34.4 g/dL (ref 30.0–36.0)
MCV: 92.1 fL (ref 80.0–100.0)
Platelets: 254 K/uL (ref 150–400)
RBC: 4.04 MIL/uL (ref 3.87–5.11)
RDW: 12.3 % (ref 11.5–15.5)
WBC: 10.5 K/uL (ref 4.0–10.5)
nRBC: 0 % (ref 0.0–0.2)

## 2024-03-31 LAB — HCG, SERUM, QUALITATIVE: Preg, Serum: NEGATIVE

## 2024-03-31 LAB — LIPASE, BLOOD: Lipase: 28 U/L (ref 11–51)

## 2024-03-31 MED ORDER — SODIUM CHLORIDE 0.9 % IV BOLUS
1000.0000 mL | Freq: Once | INTRAVENOUS | Status: AC
  Administered 2024-03-31: 1000 mL via INTRAVENOUS

## 2024-03-31 MED ORDER — HYDROCODONE-ACETAMINOPHEN 5-325 MG PO TABS: 1.0000 | ORAL_TABLET | ORAL | 0 refills | Status: AC | PRN

## 2024-03-31 MED ORDER — MORPHINE SULFATE (PF) 4 MG/ML IV SOLN
4.0000 mg | Freq: Once | INTRAVENOUS | Status: AC
  Administered 2024-03-31: 4 mg via INTRAVENOUS
  Filled 2024-03-31: qty 1

## 2024-03-31 MED ORDER — AMOXICILLIN-POT CLAVULANATE 875-125 MG PO TABS
1.0000 | ORAL_TABLET | Freq: Two times a day (BID) | ORAL | 0 refills | Status: DC
Start: 2024-03-31 — End: 2024-03-31

## 2024-03-31 MED ORDER — ONDANSETRON HCL 4 MG/2ML IJ SOLN
4.0000 mg | Freq: Once | INTRAMUSCULAR | Status: AC
  Administered 2024-03-31: 4 mg via INTRAVENOUS
  Filled 2024-03-31: qty 2

## 2024-03-31 MED ORDER — ONDANSETRON 4 MG PO TBDP: ORAL_TABLET | ORAL | 0 refills | Status: AC

## 2024-03-31 MED ORDER — METOCLOPRAMIDE HCL 10 MG PO TABS: 10.0000 mg | ORAL_TABLET | Freq: Four times a day (QID) | ORAL | 0 refills | Status: AC | PRN

## 2024-03-31 MED ORDER — ONDANSETRON 4 MG PO TBDP
ORAL_TABLET | ORAL | 0 refills | Status: DC
Start: 2024-03-31 — End: 2024-03-31

## 2024-03-31 MED ORDER — AMOXICILLIN-POT CLAVULANATE 875-125 MG PO TABS: 1.0000 | ORAL_TABLET | Freq: Two times a day (BID) | ORAL | 0 refills | Status: AC

## 2024-03-31 MED ORDER — IOHEXOL 300 MG/ML  SOLN
100.0000 mL | Freq: Once | INTRAMUSCULAR | Status: AC | PRN
  Administered 2024-03-31: 100 mL via INTRAVENOUS

## 2024-03-31 MED ORDER — METOCLOPRAMIDE HCL 10 MG PO TABS
10.0000 mg | ORAL_TABLET | Freq: Four times a day (QID) | ORAL | 0 refills | Status: DC | PRN
Start: 2024-03-31 — End: 2024-03-31

## 2024-03-31 MED ORDER — DIPHENHYDRAMINE HCL 50 MG/ML IJ SOLN
25.0000 mg | Freq: Once | INTRAMUSCULAR | Status: AC
  Administered 2024-03-31: 25 mg via INTRAVENOUS
  Filled 2024-03-31: qty 1

## 2024-03-31 MED ORDER — KETOROLAC TROMETHAMINE 30 MG/ML IJ SOLN
15.0000 mg | Freq: Once | INTRAMUSCULAR | Status: AC
  Administered 2024-03-31: 15 mg via INTRAVENOUS
  Filled 2024-03-31: qty 1

## 2024-03-31 MED ORDER — METOCLOPRAMIDE HCL 5 MG/ML IJ SOLN
10.0000 mg | Freq: Once | INTRAMUSCULAR | Status: AC
Start: 2024-03-31 — End: 2024-03-31
  Administered 2024-03-31: 10 mg via INTRAVENOUS
  Filled 2024-03-31: qty 2

## 2024-03-31 MED ORDER — HYDROMORPHONE HCL 1 MG/ML IJ SOLN
1.0000 mg | Freq: Once | INTRAMUSCULAR | Status: AC
  Administered 2024-03-31: 1 mg via INTRAVENOUS
  Filled 2024-03-31: qty 1

## 2024-03-31 MED ORDER — HYDROCODONE-ACETAMINOPHEN 5-325 MG PO TABS: 1.0000 | ORAL_TABLET | ORAL | 0 refills | Status: DC | PRN

## 2024-03-31 NOTE — ED Notes (Signed)
 Pt called out states she is in pain again 10/10, states continued nausea and vomiting.  Provider made aware.

## 2024-03-31 NOTE — ED Triage Notes (Signed)
 Pt states emesis x 3 days, denies any diarrhea  Denies fever  States pain in middle of back   Provider at bedside

## 2024-03-31 NOTE — ED Notes (Signed)
 Patient transported to CT

## 2024-03-31 NOTE — ED Provider Notes (Signed)
 [place] [facility] AT [facility] Provider Note   CSN: [id number] Arrival date & time: 03/31/24  9746     Patient presents with: Emesis   Tammy Andrews is a 33 y.o. female.   Presents to the emerged department for evaluation of lower to mid back pain, nausea, vomiting for 3 days.  Patient reports that the pain is severe, no known injury.  She has not been able to hold anything down.       Prior to Admission medications   Medication Sig Start Date End Date Taking? Authorizing Provider  amoxicillin -clavulanate (AUGMENTIN ) 875-125 MG tablet Take 1 tablet by mouth every 12 (twelve) hours. 03/31/24  Yes D'Arcy Abraha, Lonni PARAS, MD  HYDROcodone -acetaminophen  (NORCO/VICODIN) 5-325 MG tablet Take 1 tablet by mouth every 4 (four) hours as needed for moderate pain (pain score 4-6). 03/31/24  Yes Melat Wrisley, Lonni PARAS, MD  metoCLOPramide  (REGLAN ) 10 MG tablet Take 1 tablet (10 mg total) by mouth every 6 (six) hours as needed for nausea (nausea/headache). 03/31/24  Yes Wyley Hack, Lonni PARAS, MD  ondansetron  (ZOFRAN -ODT) 4 MG disintegrating tablet 4mg  ODT q4 hours prn nausea/vomit 03/31/24  Yes Marisal Swarey, Lonni PARAS, MD  cephALEXin  (KEFLEX ) 500 MG capsule Take 1 capsule (500 mg total) by mouth 3 (three) times daily. 02/15/21   Dean Clarity, MD  ciprofloxacin  (CIPRO ) 500 MG tablet Take 1 tablet (500 mg total) by mouth every 12 (twelve) hours. 08/06/21   Prosperi, Christian H, PA-C  cyclobenzaprine  (FLEXERIL ) 10 MG tablet Take 1 tablet (10 mg total) by mouth 2 (two) times daily as needed for muscle spasms. 12/26/23   Mayer, Jodi R, NP  ibuprofen  (ADVIL ) 600 MG tablet Take 1 tablet (600 mg total) by mouth every 6 (six) hours as needed. 02/15/21   Dean Clarity, MD  lidocaine  (LIDODERM ) 5 % Place 1 patch onto the skin daily. Remove & Discard patch within 12 hours or as directed by MD 01/19/24   Carita Senior, MD  methocarbamol  (ROBAXIN ) 500 MG tablet Take 1 tablet (500 mg total)  by mouth every 8 (eight) hours as needed for muscle spasms. 01/19/24   Rancour, Senior, MD  methylPREDNISolone  (MEDROL  DOSEPAK) 4 MG TBPK tablet As directed 01/19/24   Rancour, Senior, MD  haloperidol  (HALDOL ) 5 MG tablet Take 1 tablet (5 mg total) by mouth every 6 (six) hours as needed (nausea or vomiting). 06/17/19 06/18/19  Raford Lenis, MD    Allergies: Haldol  [haloperidol  lactate] and Pork-derived products    Review of Systems  Updated Vital Signs BP (!) 143/97   Pulse 77   Temp 97.9 F (36.6 C) (Oral)   Resp 18   Ht 5' 7 (1.702 m)   Wt 92.5 kg   SpO2 100%   BMI 31.94 kg/m   Physical Exam Vitals and nursing note reviewed.  Constitutional:      General: She is in acute distress.     Appearance: She is well-developed.  HENT:     Head: Normocephalic and atraumatic.     Mouth/Throat:     Mouth: Mucous membranes are moist.  Eyes:     General: Vision grossly intact. Gaze aligned appropriately.     Extraocular Movements: Extraocular movements intact.     Conjunctiva/sclera: Conjunctivae normal.  Cardiovascular:     Rate and Rhythm: Normal rate and regular rhythm.     Pulses: Normal pulses.     Heart sounds: Normal heart sounds, S1 normal and S2 normal. No murmur heard.    No friction rub. No  gallop.  Pulmonary:     Effort: Pulmonary effort is normal. No respiratory distress.     Breath sounds: Normal breath sounds.  Abdominal:     General: Bowel sounds are normal.     Palpations: Abdomen is soft.     Tenderness: There is no abdominal tenderness. There is no guarding or rebound.     Hernia: No hernia is present.  Musculoskeletal:        General: No swelling.     Cervical back: Full passive range of motion without pain, normal range of motion and neck supple. No spinous process tenderness or muscular tenderness.       Back:     Right lower leg: No edema.     Left lower leg: No edema.  Skin:    General: Skin is warm and dry.     Capillary Refill: Capillary refill  takes less than 2 seconds.     Findings: No ecchymosis, erythema, rash or wound.  Neurological:     General: No focal deficit present.     Mental Status: She is alert and oriented to person, place, and time.     GCS: GCS eye subscore is 4. GCS verbal subscore is 5. GCS motor subscore is 6.     Cranial Nerves: Cranial nerves 2-12 are intact.     Sensory: Sensation is intact.     Motor: Motor function is intact.     Coordination: Coordination is intact.  Psychiatric:        Attention and Perception: Attention normal.        Mood and Affect: Affect is tearful.        Speech: Speech normal.        Behavior: Behavior normal.     (all labs ordered are listed, but only abnormal results are displayed) Labs Reviewed  COMPREHENSIVE METABOLIC PANEL WITH GFR - Abnormal; Notable for the following components:      Result Value   Glucose, Bld 170 (*)    All other components within normal limits  LIPASE, BLOOD  CBC  HCG, SERUM, QUALITATIVE  URINALYSIS, ROUTINE W REFLEX MICROSCOPIC    EKG: None  Radiology: CT CHEST ABDOMEN PELVIS W CONTRAST Result Date: 03/31/2024 EXAM: CT CHEST, ABDOMEN AND PELVIS WITH CONTRAST 03/31/2024 06:07:00 AM TECHNIQUE: CT of the chest, abdomen and pelvis was performed with the administration of intravenous contrast (100mL iohexol  (OMNIPAQUE ) 300 MG/ML solution). Multiplanar reformatted images are provided for review. Automated exposure control, iterative reconstruction, and/or weight based adjustment of the mA/kV was utilized to reduce the radiation dose to as low as reasonably achievable. COMPARISON: CT AP 05/23/2023 CLINICAL HISTORY: Vomiting, chest, abdominal and back pain for 3 days. Denies diarrhea and fever. FINDINGS: CHEST: MEDIASTINUM AND LYMPH NODES: Heart and pericardium are unremarkable. The central airways are clear. No mediastinal, hilar or axillary lymphadenopathy. LUNGS AND PLEURA: Asymmetric ground glass attenuation within the right lower lobe is  identified (image 71/2). There is also mild ground-glass attenuation within the inferior right middle lobe. No consolidative change. No pleural effusion or pneumothorax. ABDOMEN AND PELVIS: LIVER: The liver is unremarkable. GALLBLADDER AND BILE DUCTS: Gallbladder is unremarkable. No biliary ductal dilatation or inflammation. SPLEEN: No acute abnormality. PANCREAS: No acute abnormality. ADRENAL GLANDS: No acute abnormality. KIDNEYS, URETERS AND BLADDER: No stones in the kidneys or ureters. No hydronephrosis. No perinephric or periureteral stranding. Urinary bladder is unremarkable. GI AND BOWEL: Status post sleeve gastrectomy. Mild stool burden noted within the colon. The appendix is visualized and appears  normal. No signs of bowel inflammation. There is no bowel obstruction. REPRODUCTIVE ORGANS: Uterus and adnexal structures are unremarkable. PERITONEUM AND RETROPERITONEUM: No ascites. No free air. VASCULATURE: Aorta is normal in caliber. ABDOMINAL AND PELVIS LYMPH NODES: No lymphadenopathy. BONES AND SOFT TISSUES: No acute osseous abnormality. No focal soft tissue abnormality. IMPRESSION: 1. Asymmetric ground glass attenuation within the right lower lobe and mild ground-glass attenuation within the inferior right middle lobe. No consolidative change. Findings are nonspecific and may reflect underline asymmetric edema or atypical inflammation/infection. 2. No signs of bowel obstruction or abscess within the abdomen or pelvis. 3. Mild stool burden noted within the colon. Electronically signed by: Waddell Calk MD 03/31/2024 06:34 AM EDT RP Workstation: [id number]     Procedures   Medications Ordered in the ED  sodium chloride  0.9 % bolus 1,000 mL (0 mLs Intravenous Stopped 03/31/24 0448)  ondansetron  (ZOFRAN ) injection 4 mg (4 mg Intravenous Given 03/31/24 0325)  metoCLOPramide  (REGLAN ) injection 10 mg (10 mg Intravenous Given 03/31/24 0325)  diphenhydrAMINE  (BENADRYL ) injection 25 mg (25 mg Intravenous Given  03/31/24 0325)  morphine  (PF) 4 MG/ML injection 4 mg (4 mg Intravenous Given 03/31/24 0326)  HYDROmorphone  (DILAUDID ) injection 1 mg (1 mg Intravenous Given 03/31/24 0457)  ketorolac  (TORADOL ) 30 MG/ML injection 15 mg (15 mg Intravenous Given 03/31/24 0456)  iohexol  (OMNIPAQUE ) 300 MG/ML solution 100 mL (100 mLs Intravenous Contrast Given 03/31/24 0607)                                    Medical Decision Making Amount and/or Complexity of Data Reviewed External Data Reviewed: labs and notes. Labs: ordered. Decision-making details documented in ED Course. Radiology: ordered and independent interpretation performed. Decision-making details documented in ED Course.  Risk Prescription drug management.   Differential Diagnosis considered includes, but not limited to: Cholelithiasis; cholecystitis; cholangitis; bowel obstruction; esophagitis; gastritis; peptic ulcer disease; pancreatitis; cardiac.   Patient presents to the emergency department for evaluation of upper abdominal pain and back pain.  Reviewing her records reveals that she has a history of cyclic vomiting syndrome.  Examination reveals mid back pain across the paraspinal regions bilaterally.  Pain is worsens with any movement.  Lab work was unremarkable.  Patient underwent CT chest abdomen and pelvis to further evaluate.  The only pathology noted is some haziness in the right lung that could be inflammatory or infectious.  Will need to cover for possible aspiration.  Patient hydrated and medicated and appears to be doing better.  She will be continued on symptomatic treatment.     Final diagnoses:  Cyclical vomiting syndrome not associated with migraine  Thoracic myofascial strain, initial encounter    ED Discharge Orders          Ordered    amoxicillin -clavulanate (AUGMENTIN ) 875-125 MG tablet  Every 12 hours        03/31/24 0653    metoCLOPramide  (REGLAN ) 10 MG tablet  Every 6 hours PRN        03/31/24 0653     ondansetron  (ZOFRAN -ODT) 4 MG disintegrating tablet        03/31/24 0653    HYDROcodone -acetaminophen  (NORCO/VICODIN) 5-325 MG tablet  Every 4 hours PRN        03/31/24 0653               Haze Lonni PARAS, MD 03/31/24 [phone]

## 2024-03-31 NOTE — ED Notes (Signed)
